# Patient Record
Sex: Male | Born: 1940 | Race: White | Hispanic: No | Marital: Married | Smoking: Former smoker
Health system: Southern US, Community
[De-identification: ages and names within clinical notes are randomized; demographics above are authoritative.]

## PROBLEM LIST (undated history)

## (undated) DIAGNOSIS — E119 Type 2 diabetes mellitus without complications: Secondary | ICD-10-CM

## (undated) DIAGNOSIS — K219 Gastro-esophageal reflux disease without esophagitis: Secondary | ICD-10-CM

## (undated) DIAGNOSIS — R011 Cardiac murmur, unspecified: Secondary | ICD-10-CM

## (undated) DIAGNOSIS — R55 Syncope and collapse: Secondary | ICD-10-CM

## (undated) DIAGNOSIS — Z87442 Personal history of urinary calculi: Secondary | ICD-10-CM

## (undated) DIAGNOSIS — I4891 Unspecified atrial fibrillation: Secondary | ICD-10-CM

## (undated) DIAGNOSIS — N189 Chronic kidney disease, unspecified: Secondary | ICD-10-CM

## (undated) DIAGNOSIS — I2510 Atherosclerotic heart disease of native coronary artery without angina pectoris: Secondary | ICD-10-CM

## (undated) DIAGNOSIS — R0600 Dyspnea, unspecified: Secondary | ICD-10-CM

## (undated) DIAGNOSIS — M1990 Unspecified osteoarthritis, unspecified site: Secondary | ICD-10-CM

## (undated) DIAGNOSIS — I219 Acute myocardial infarction, unspecified: Secondary | ICD-10-CM

## (undated) DIAGNOSIS — E785 Hyperlipidemia, unspecified: Secondary | ICD-10-CM

## (undated) DIAGNOSIS — G4730 Sleep apnea, unspecified: Secondary | ICD-10-CM

## (undated) DIAGNOSIS — K2270 Barrett's esophagus without dysplasia: Secondary | ICD-10-CM

## (undated) DIAGNOSIS — I10 Essential (primary) hypertension: Secondary | ICD-10-CM

## (undated) HISTORY — PX: HERNIA REPAIR: SHX51

## (undated) HISTORY — PX: JOINT REPLACEMENT: SHX530

## (undated) HISTORY — PX: LITHOTRIPSY: SUR834

## (undated) HISTORY — DX: Gastro-esophageal reflux disease without esophagitis: K21.9

## (undated) HISTORY — PX: CORONARY ANGIOPLASTY WITH STENT PLACEMENT: SHX49

## (undated) HISTORY — DX: Acute myocardial infarction, unspecified: I21.9

## (undated) HISTORY — PX: NEPHROLITHOTOMY: SHX5134

## (undated) HISTORY — PX: KIDNEY STONE SURGERY: SHX686

## (undated) HISTORY — PX: SHOULDER SURGERY: SHX246

## (undated) HISTORY — DX: Unspecified osteoarthritis, unspecified site: M19.90

## (undated) HISTORY — PX: UPPER GI ENDOSCOPY: SHX6162

## (undated) HISTORY — PX: CORONARY ARTERY BYPASS GRAFT: SHX141

## (undated) HISTORY — PX: COLONOSCOPY W/ POLYPECTOMY: SHX1380

## (undated) HISTORY — DX: Chronic kidney disease, unspecified: N18.9

## (undated) HISTORY — DX: Cardiac murmur, unspecified: R01.1

---

## 1997-11-24 ENCOUNTER — Ambulatory Visit (HOSPITAL_COMMUNITY): Admission: RE | Admit: 1997-11-24 | Discharge: 1997-11-24 | Payer: Self-pay | Admitting: Cardiology

## 1997-12-03 ENCOUNTER — Ambulatory Visit (HOSPITAL_COMMUNITY): Admission: RE | Admit: 1997-12-03 | Discharge: 1997-12-03 | Payer: Self-pay | Admitting: Cardiology

## 1999-03-06 ENCOUNTER — Emergency Department (HOSPITAL_COMMUNITY): Admission: EM | Admit: 1999-03-06 | Discharge: 1999-03-06 | Payer: Self-pay | Admitting: Emergency Medicine

## 1999-03-06 ENCOUNTER — Encounter: Payer: Self-pay | Admitting: Emergency Medicine

## 1999-04-13 ENCOUNTER — Inpatient Hospital Stay (HOSPITAL_COMMUNITY): Admission: RE | Admit: 1999-04-13 | Discharge: 1999-04-15 | Payer: Self-pay | Admitting: Orthopedic Surgery

## 1999-04-13 ENCOUNTER — Encounter: Payer: Self-pay | Admitting: Orthopedic Surgery

## 1999-06-17 ENCOUNTER — Inpatient Hospital Stay (HOSPITAL_COMMUNITY): Admission: RE | Admit: 1999-06-17 | Discharge: 1999-06-19 | Payer: Self-pay | Admitting: Orthopedic Surgery

## 2002-06-09 ENCOUNTER — Inpatient Hospital Stay (HOSPITAL_COMMUNITY): Admission: RE | Admit: 2002-06-09 | Discharge: 2002-06-11 | Payer: Self-pay | Admitting: Urology

## 2002-06-09 ENCOUNTER — Encounter: Payer: Self-pay | Admitting: Urology

## 2002-06-18 ENCOUNTER — Ambulatory Visit (HOSPITAL_COMMUNITY): Admission: RE | Admit: 2002-06-18 | Discharge: 2002-06-18 | Payer: Self-pay | Admitting: Urology

## 2002-06-18 ENCOUNTER — Encounter: Payer: Self-pay | Admitting: Urology

## 2003-06-01 ENCOUNTER — Inpatient Hospital Stay (HOSPITAL_COMMUNITY): Admission: EM | Admit: 2003-06-01 | Discharge: 2003-06-02 | Payer: Self-pay | Admitting: Emergency Medicine

## 2003-06-08 ENCOUNTER — Ambulatory Visit (HOSPITAL_COMMUNITY): Admission: RE | Admit: 2003-06-08 | Discharge: 2003-06-08 | Payer: Self-pay | Admitting: Cardiology

## 2004-09-23 ENCOUNTER — Observation Stay (HOSPITAL_COMMUNITY): Admission: RE | Admit: 2004-09-23 | Discharge: 2004-09-24 | Payer: Self-pay | Admitting: Cardiology

## 2004-12-23 ENCOUNTER — Ambulatory Visit: Payer: Self-pay | Admitting: Gastroenterology

## 2004-12-27 ENCOUNTER — Ambulatory Visit: Payer: Self-pay | Admitting: Gastroenterology

## 2004-12-27 ENCOUNTER — Encounter (INDEPENDENT_AMBULATORY_CARE_PROVIDER_SITE_OTHER): Payer: Self-pay | Admitting: Specialist

## 2005-01-02 ENCOUNTER — Ambulatory Visit: Payer: Self-pay | Admitting: Gastroenterology

## 2005-01-12 ENCOUNTER — Ambulatory Visit: Payer: Self-pay | Admitting: Gastroenterology

## 2005-12-08 ENCOUNTER — Ambulatory Visit (HOSPITAL_BASED_OUTPATIENT_CLINIC_OR_DEPARTMENT_OTHER): Admission: RE | Admit: 2005-12-08 | Discharge: 2005-12-08 | Payer: Self-pay | Admitting: Urology

## 2005-12-18 ENCOUNTER — Ambulatory Visit (HOSPITAL_BASED_OUTPATIENT_CLINIC_OR_DEPARTMENT_OTHER): Admission: RE | Admit: 2005-12-18 | Discharge: 2005-12-18 | Payer: Self-pay | Admitting: Urology

## 2006-08-02 ENCOUNTER — Inpatient Hospital Stay (HOSPITAL_COMMUNITY): Admission: RE | Admit: 2006-08-02 | Discharge: 2006-08-04 | Payer: Self-pay | Admitting: Urology

## 2007-05-06 ENCOUNTER — Emergency Department (HOSPITAL_COMMUNITY): Admission: EM | Admit: 2007-05-06 | Discharge: 2007-05-07 | Payer: Self-pay | Admitting: Emergency Medicine

## 2007-05-07 ENCOUNTER — Encounter (INDEPENDENT_AMBULATORY_CARE_PROVIDER_SITE_OTHER): Payer: Self-pay | Admitting: *Deleted

## 2007-08-30 ENCOUNTER — Telehealth: Payer: Self-pay | Admitting: Gastroenterology

## 2008-08-19 ENCOUNTER — Telehealth: Payer: Self-pay | Admitting: Gastroenterology

## 2008-08-25 DIAGNOSIS — I119 Hypertensive heart disease without heart failure: Secondary | ICD-10-CM | POA: Insufficient documentation

## 2008-08-25 DIAGNOSIS — G4730 Sleep apnea, unspecified: Secondary | ICD-10-CM | POA: Insufficient documentation

## 2008-08-25 DIAGNOSIS — E119 Type 2 diabetes mellitus without complications: Secondary | ICD-10-CM | POA: Insufficient documentation

## 2008-08-26 ENCOUNTER — Ambulatory Visit: Payer: Self-pay | Admitting: Gastroenterology

## 2008-08-26 DIAGNOSIS — K2270 Barrett's esophagus without dysplasia: Secondary | ICD-10-CM | POA: Insufficient documentation

## 2008-09-10 ENCOUNTER — Ambulatory Visit: Payer: Self-pay | Admitting: Gastroenterology

## 2008-09-10 ENCOUNTER — Encounter: Payer: Self-pay | Admitting: Gastroenterology

## 2008-09-22 ENCOUNTER — Encounter: Payer: Self-pay | Admitting: Gastroenterology

## 2009-02-12 ENCOUNTER — Inpatient Hospital Stay (HOSPITAL_COMMUNITY): Admission: EM | Admit: 2009-02-12 | Discharge: 2009-02-17 | Payer: Self-pay | Admitting: Emergency Medicine

## 2010-03-04 ENCOUNTER — Other Ambulatory Visit: Payer: Self-pay | Admitting: Surgery

## 2010-03-05 ENCOUNTER — Encounter: Payer: Self-pay | Admitting: Gastroenterology

## 2010-05-01 LAB — T4, FREE: Free T4: 1.46 ng/dL (ref 0.80–1.80)

## 2010-05-01 LAB — CBC
HCT: 37.4 % — ABNORMAL LOW (ref 39.0–52.0)
HCT: 38.1 % — ABNORMAL LOW (ref 39.0–52.0)
Hemoglobin: 11.9 g/dL — ABNORMAL LOW (ref 13.0–17.0)
Hemoglobin: 12.9 g/dL — ABNORMAL LOW (ref 13.0–17.0)
Hemoglobin: 13.2 g/dL (ref 13.0–17.0)
MCHC: 34.7 g/dL (ref 30.0–36.0)
MCHC: 34.9 g/dL (ref 30.0–36.0)
MCHC: 35.2 g/dL (ref 30.0–36.0)
MCV: 96.6 fL (ref 78.0–100.0)
MCV: 96.7 fL (ref 78.0–100.0)
Platelets: 196 10*3/uL (ref 150–400)
Platelets: 202 10*3/uL (ref 150–400)
Platelets: 230 10*3/uL (ref 150–400)
RBC: 3.9 MIL/uL — ABNORMAL LOW (ref 4.22–5.81)
RDW: 12.8 % (ref 11.5–15.5)
RDW: 13 % (ref 11.5–15.5)
RDW: 13.4 % (ref 11.5–15.5)
WBC: 10 10*3/uL (ref 4.0–10.5)
WBC: 8 10*3/uL (ref 4.0–10.5)

## 2010-05-01 LAB — BASIC METABOLIC PANEL WITH GFR
BUN: 19 mg/dL (ref 6–23)
CO2: 21 meq/L (ref 19–32)
Calcium: 8.9 mg/dL (ref 8.4–10.5)
Chloride: 106 meq/L (ref 96–112)
Creatinine, Ser: 0.87 mg/dL (ref 0.4–1.5)
GFR calc non Af Amer: >60 mL/min
Glucose, Bld: 194 mg/dL — ABNORMAL HIGH (ref 70–99)
Potassium: 4.3 meq/L (ref 3.5–5.1)
Sodium: 134 meq/L — ABNORMAL LOW (ref 135–145)

## 2010-05-01 LAB — CARDIAC PANEL(CRET KIN+CKTOT+MB+TROPI)
CK, MB: 1.2 ng/mL (ref 0.3–4.0)
Relative Index: INVALID (ref 0.0–2.5)
Total CK: 41 U/L (ref 7–232)
Total CK: 59 U/L (ref 7–232)
Troponin I: 0.12 ng/mL — ABNORMAL HIGH (ref 0.00–0.06)
Troponin I: 0.22 ng/mL — ABNORMAL HIGH (ref 0.00–0.06)

## 2010-05-01 LAB — BASIC METABOLIC PANEL
BUN: 13 mg/dL (ref 6–23)
BUN: 19 mg/dL (ref 6–23)
CO2: 21 mEq/L (ref 19–32)
CO2: 23 mEq/L (ref 19–32)
CO2: 26 mEq/L (ref 19–32)
Calcium: 8.9 mg/dL (ref 8.4–10.5)
Calcium: 9.1 mg/dL (ref 8.4–10.5)
Chloride: 103 mEq/L (ref 96–112)
Chloride: 107 mEq/L (ref 96–112)
Creatinine, Ser: 0.88 mg/dL (ref 0.4–1.5)
Creatinine, Ser: 1.14 mg/dL (ref 0.4–1.5)
GFR calc Af Amer: >60 mL/min (ref >60)
Glucose, Bld: 167 mg/dL — ABNORMAL HIGH (ref 70–99)
Glucose, Bld: 182 mg/dL — ABNORMAL HIGH (ref 70–99)
Glucose, Bld: 207 mg/dL — ABNORMAL HIGH (ref 70–99)
Glucose, Bld: 279 mg/dL — ABNORMAL HIGH (ref 70–99)
Potassium: 3.9 mEq/L (ref 3.5–5.1)
Potassium: 3.9 mEq/L (ref 3.5–5.1)
Sodium: 136 mEq/L (ref 135–145)
Sodium: 137 mEq/L (ref 135–145)

## 2010-05-01 LAB — LIPID PANEL
HDL: 35 mg/dL — ABNORMAL LOW (ref >39)
Total CHOL/HDL Ratio: 3.8 RATIO
Triglycerides: 152 mg/dL — ABNORMAL HIGH (ref <150)
VLDL: 30 mg/dL (ref 0–40)

## 2010-05-01 LAB — GLUCOSE, CAPILLARY
Glucose-Capillary: 157 mg/dL — ABNORMAL HIGH (ref 70–99)
Glucose-Capillary: 198 mg/dL — ABNORMAL HIGH (ref 70–99)
Glucose-Capillary: 199 mg/dL — ABNORMAL HIGH (ref 70–99)
Glucose-Capillary: 244 mg/dL — ABNORMAL HIGH (ref 70–99)
Glucose-Capillary: 250 mg/dL — ABNORMAL HIGH (ref 70–99)
Glucose-Capillary: 265 mg/dL — ABNORMAL HIGH (ref 70–99)

## 2010-05-01 LAB — PROTIME-INR: INR: 1.06 (ref 0.00–1.49)

## 2010-05-01 LAB — HEPARIN LEVEL (UNFRACTIONATED): Heparin Unfractionated: 0.46 IU/mL (ref 0.30–0.70)

## 2010-05-01 LAB — APTT: aPTT: 29 seconds (ref 24–37)

## 2010-05-01 LAB — T3: T3, Total: 113.7 ng/dl (ref 80.0–204.0)

## 2010-05-01 LAB — TSH: TSH: 0.279 u[IU]/mL — ABNORMAL LOW (ref 0.350–4.500)

## 2010-05-16 LAB — URINALYSIS, ROUTINE W REFLEX MICROSCOPIC
Bilirubin Urine: NEGATIVE
Hgb urine dipstick: NEGATIVE
Ketones, ur: NEGATIVE mg/dL
Protein, ur: NEGATIVE mg/dL
Urobilinogen, UA: 0.2 mg/dL (ref 0.0–1.0)

## 2010-05-16 LAB — CBC
HCT: 41.4 % (ref 39.0–52.0)
MCHC: 35.1 g/dL (ref 30.0–36.0)
MCV: 96.3 fL (ref 78.0–100.0)
RBC: 4.3 MIL/uL (ref 4.22–5.81)

## 2010-05-16 LAB — DIFFERENTIAL
Basophils Absolute: 0 10*3/uL (ref 0.0–0.1)
Lymphocytes Relative: 33 % (ref 12–46)
Lymphs Abs: 2.6 10*3/uL (ref 0.7–4.0)
Neutro Abs: 4.2 10*3/uL (ref 1.7–7.7)
Neutrophils Relative %: 54 % (ref 43–77)

## 2010-05-16 LAB — COMPREHENSIVE METABOLIC PANEL
BUN: 20 mg/dL (ref 6–23)
CO2: 24 mEq/L (ref 19–32)
Calcium: 9.7 mg/dL (ref 8.4–10.5)
Chloride: 102 mEq/L (ref 96–112)
Creatinine, Ser: 0.86 mg/dL (ref 0.4–1.5)
GFR calc Af Amer: >60 mL/min (ref >60)
GFR calc non Af Amer: >60 mL/min (ref >60)
Glucose, Bld: 173 mg/dL — ABNORMAL HIGH (ref 70–99)
Total Bilirubin: 0.7 mg/dL (ref 0.3–1.2)

## 2010-05-16 LAB — POCT CARDIAC MARKERS
Myoglobin, poc: 72 ng/mL (ref 12–200)
Troponin i, poc: 0.11 ng/mL — ABNORMAL HIGH (ref 0.00–0.09)

## 2010-05-16 LAB — HEMOGLOBIN A1C
Hgb A1c MFr Bld: 9.1 % — ABNORMAL HIGH (ref 4.6–6.1)
Mean Plasma Glucose: 214 mg/dL

## 2010-05-16 LAB — PROTIME-INR
INR: 0.97 (ref 0.00–1.49)
Prothrombin Time: 12.8 seconds (ref 11.6–15.2)

## 2010-05-16 LAB — TSH: TSH: 0.294 u[IU]/mL — ABNORMAL LOW (ref 0.350–4.500)

## 2010-05-16 LAB — TROPONIN I: Troponin I: 0.24 ng/mL — ABNORMAL HIGH (ref 0.00–0.06)

## 2010-05-16 LAB — MAGNESIUM: Magnesium: 1.8 mg/dL (ref 1.5–2.5)

## 2010-05-22 LAB — GLUCOSE, CAPILLARY: Glucose-Capillary: 170 mg/dL — ABNORMAL HIGH (ref 70–99)

## 2010-06-28 NOTE — Consult Note (Signed)
[name], [name]              ACCOUNT NO.:  [id number]   MEDICAL RECORD NO.:  [id number]          PATIENT TYPE:  INP   LOCATION:  1410                         FACILITY:  [facility]   PHYSICIAN:  Tera Mater. [place], M.D. DATE OF BIRTH:  July 18, 1940   DATE OF CONSULTATION:  08/04/2006  DATE OF DISCHARGE:                                 CONSULTATION   HISTORY OF PRESENT ILLNESS:  [name] is a 70 year old white male with  a history of long standing hypertension, type 2 diabetes who is treated  with oral agents, coronary artery disease, sleep apnea, hyperlipidemia,  and Barrett's esophagus. He was last seen in our office on April 12, 2006. He is now admitted with a renal calculus, requiring an open  extraction. He is now postoperative day 2. I was asked to see him for  elevated blood pressure's.   The patient is doing well. He is sitting up, he is eating, he is having  no pain at the moment. His blood pressure's have been intermittently  elevated. The highest one I can see measures 190/90. He was in the 150's  for much of the day, yesterday, and then has up a bit further to 177/78  this morning. He specifically has no breathing trouble, no chest pain,  no headache, no vision change, or any other complaints. His blood sugar  is running well and it was about 118 this morning. He does not note any  other complaints.   LABORATORY DATA:  From the 20th, shows a BUN of 21, creatinine 1.26,  glucose 158. Electrolytes were fine. Creatinine later in the day was  1.4.   IMPRESSION:  In summary, we have a 70 year old with multiple medical  problems including known hypertension. At the present time, he is  suboptimally controlled on triple oral therapy with Avapro, Coreg, and  Norvasc. All of these issues can be managed as an outpatient, however.  He is not in any immediate harm. I am going to give him a prescription  for Avalide 300/12.5, to replace his Avapro. He will continue on his  Coreg  25 twice daily, his Norvasc once daily, and his Vytorin. He is  also on Plavix for his cardiac issues and Actos and a baby aspirin, all  of which he will continue. I do not see any reason to delay his  discharge due to this. I will notify Dr. Wylene Simmer of these issues.           ______________________________  Tera Mater Evlyn Kanner, M.D.     SAS/MEDQ  D:  08/04/2006  T:  08/04/2006  Job:  [id number]

## 2010-06-28 NOTE — Op Note (Signed)
NAMEKELLEN, [name]              ACCOUNT NO.:  [id number]   MEDICAL RECORD NO.:  [id number]          PATIENT TYPE:  OIB   LOCATION:  1410                         FACILITY:  [facility]   PHYSICIAN:  Terie Purser, MD         DATE OF BIRTH:  1940-09-22   DATE OF PROCEDURE:  08/01/2006  DATE OF DISCHARGE:                               OPERATIVE REPORT   PREOPERATIVE DIAGNOSIS:  Left ureteral stones.   POSTOPERATIVE DIAGNOSIS:  Left ureteral stones.   PROCEDURES:  1. Left ureterolithotomy.  2. Flexible left ureteroscopy.   SURGEON:  Bertram Dyshon. Retta Diones, M.D.   RESIDENT:  Terie Purser, MD.   ANESTHESIA:  General.   COMPLICATIONS:  None.   SPECIMENS:  Stones to patient.   ESTIMATED BLOOD LOSS:  150 mL.   DRAINS:  6-French 26 cm double-J ureteral stent on the left.   DISPOSITION:  Stable to post anesthesia care unit.   INDICATIONS FOR PROCEDURE:  Mr. Buchanon is a 70 year old male who has a  history of bilateral kidney stones.  He has had large left-sided  ureteral stones.  He has undergone shock wave lithotripsy as well as  ureteroscopic stone manipulation.  He still has continued large burden  of stone and was counseled regarding continued endoscopic management  versus open procedure.  He has decided to proceed with open right  ureterolithotomy. after full discussion of benefits and risks, his  consent was obtained.   DESCRIPTION OF PROCEDURE:  The patient brought to the operating room  after properly identified.  Time-out was performed to confirm the  correct patient, procedure and side.  He was administered general  anesthesia, given preoperative antibiotics and placed in the supine  position  on the operating table and a bump was placed under his left  side.  He was then prepped and draped in the usual sterile manner.  We  began by making an approximate 12 cm incision in the left lower  quadrant.  Dissection was carried down through the subcutaneous tissues  and muscle layers  with the Bovie cautery.  We entered the  retroperitoneal space and swept the peritoneum medially.  We identified  the psoas muscle.  A Bookwalter retractor was placed to facilitate  exposure.  The ureter was identified coursing through the  retroperitoneum as it crossed over the iliac vessels.  The ureter was  quite dilated and had a significant inflammatory response surrounding  the ureter.  We were able to palpate the stones in the ureter.  We  carefully dissected the ureter free of its dense adhesions proximal to  the stones and placed a Vesseloop for control.  We then, after careful  and minimal mobilization of the ureter, incised the ureter in a  longitudinal fashion using a #12 blade over the stones.  Total length of  the incision was approximately 3 cm.  We encountered first the more  proximal stone which was removed in its entirety.  The second and larger  distal stone was encountered just distal to the first stone.  The stone  was also removed in its entirety.  At this point we proceeded with flexible ureteroscopy to further  evaluate the ureter.  We first looked distally.  There were no  significant stone fragments noted between the incision and the bladder.  We then looked in the proximal portion of the ureter up into the kidney.  There were no other stones noted in the proximal ureter.  The proximal  ureter mucosa appeared normal.  Examination in the kidney did reveal a  small approximately to 3 mm stone in the lower pole of the kidney.  This  was in a lower pole calix and we were unable to retrieve this stone with  a basket and the flexible ureteroscope.  We elected not to further  pursue this stone given the lower pole nature of the stone and given its  minimal size.   We then placed a sensor wire through the incision distally into the  bladder.  A 6-French 26 cm double-J ureteral stent was placed over this  down into the bladder.  We then proceeded to place the wire  proximally  and the stent was guided over the wire into the proximal portion of the  ureter and kidney.  The wire was removed and the stent was noted to be  in good position.  At this point we proceeded to close the ureterotomy.  This was closed in a running fashion with a #4 PDS.  A JP drain was  placed through a separate stab incision into the retroperitoneum.  We  then proceeded to close.  Fascial layers were closed in two layers using  #1 PDS.  The subcutaneous tissues were then irrigated and the skin was  closed with staples.  A sterile dressing was applied.  The patient was  awakened, transported to the recovery room in stable condition.  There  was no complications.  All sponge and needle counts were correct at the  end of the case.  Please note Dr. Retta Diones was present and participated  in all aspects of this procedure as he is primary Careers adviser.           ______________________________  Terie Purser, MD     JH/MEDQ  D:  08/01/2006  T:  08/02/2006  Job:  [id number]

## 2010-06-28 NOTE — Consult Note (Signed)
[name], [name]              ACCOUNT NO.:  [id number]   MEDICAL RECORD NO.:  [id number]          PATIENT TYPE:  EMS   LOCATION:  MAJO                         FACILITY:  [facility]   PHYSICIAN:  Larina Earthly, M.D.        DATE OF BIRTH:  [birth year]   DATE OF CONSULTATION:  05/07/2007  DATE OF DISCHARGE:  05/07/2007                                 CONSULTATION   REQUESTING PHYSICIAN:  Orlene Och, MD.   REASON FOR CONSULTATION:  Request for admission based on presenting  symptoms with refusal after discussion between myself, the emergency  room physician, the patient and family members.   HISTORY OF PRESENT ILLNESS:  This is a 70 year old Caucasian gentleman  who has multiple medical comorbidities including  1. Hypertension with a history of hypertensive urgency.  2. Type 2 diabetes mellitus controlled with oral agents.  3. Coronary artery disease status post myocardial infarction and      coronary artery bypass grafting current in 1987.  4. Gastroesophageal reflux disease.  5. Obstructive sleep apnea complicated by noncompliance with CPAP      machine.  6. History of bilateral nephrolithiasis status post numerous      corrective procedures.  7. History of a left inguinal hernia repair and shoulder surgery.   This individual currently leads a fairly active lifestyle working part-  time as a Network engineer and has been followed by  both Dr. Wylene Simmer and Dr. Jacinto Halim on a regular basis.  Indeed, the patient  states that he underwent a stress test by Dr. Jacinto Halim approximately two  weeks ago which was reportedly normal and he also saw Dr. Wylene Simmer within  the last one week and had a hemoglobin A1c that was reportedly 7.   Earlier this evening on May 06, 2007, the patient was sitting in a  chair 30 minutes prior to eating dinner when he felt weird and out of  sorts.  He described this in a very vague manner and this was  associated with some abdominal discomfort and  disorientation.  The  patient may have also had some ill-described visual symptomatology.  He  denies any chest pain, shortness of breath, nausea, vomiting, focal  neurological deficits or musculoskeletal issues.  He denies any fevers  or chills.  He stated that he actually felt as if he was absent from his  body for approximately 1-2 weeks.  He did get up and eat dinner  afterwards with no nausea, vomiting or difficulty swallowing.  Soon  after dinner he discovered that he had been incontinent of his urine.  It is unclear whether this took place during the previously mentioned  episode, during dinner or soon thereafter.  Given his symptomatology,  EMS was called.  Upon their arrival, the patient was alert and oriented  x3, described the above-mentioned event and was noted that his systolic  blood pressure was approximately 226.  He was subsequently transported  to the emergency room for further evaluation and management, especially  in light of his multiple comorbidities.   In the emergency room, he was completely lucid, alert  and oriented x3  and underwent laboratory, radiological and EKG testing.  The laboratory  evaluation was unremarkable for any abnormal electrolytes, abnormal CBC.  Initial set of cardiac enzymes was unremarkable.  Head CT was  unremarkable.  EKG did reveal an incomplete right bundle branch block  and significant ST-T-wave inversions in the inferolateral leads which  was different and new compared with an EKG from August 2006, at which  time he had a cardiac stent placed after a positive stress test.  He  denied having any more recent EKG in the hospital records.  Given his  worrisome presentation and the unclear etiology of the patient's  proposed syncopal event with incontinence, I was called to admit the  patient by the emergency room physician.   Upon my arrival, the patient was completely alert and oriented x3 and  was adamant that he not be admitted given  the fact that no additional  workup other than observation was going to be performed tonight.  We  talked about the multiple possibilities for his episode which could  include an arrhythmia, seizure-like activity, TIA or simple falling  asleep given his significant obstructive sleep apnea and noncompliance  with his CPAP machine.  We further discussed the fact that observation  may be prudent to rule out hemodynamic instability if an arrhythmia were  to reoccur or if he would have any further possible seizure activity or  neurological events.  Again the patient refused admission.  This was  discussed with Dr. Patrica Duel, who agree to make a disposition on the  patient.   REVIEW OF SYSTEMS:  Again negative for fevers, chills, nausea, vomiting,  focal neurological deficits other than feeling disoriented and losing  track of time for a 10-15 minute period of time which was observed by  the patient's wife and partially by the patient's son with no tremors or  shaking type episodes.  He was found to be incontinent.  He denies any  trauma or falls.  He does describe an abnormal visual sensation that  occurred approximately one week ago that lasted approximately one hour  but again denied any blindness, visual field defects or diplopia.  He  denies any shortness of breath, cough, wheezing, bronchospasm, chills,  blood per rectum or urine or new musculoskeletal deficits.  He denies  any palpitations or difficulty swallowing.  Family does report that he  does fall asleep quite a bit during the day and consistent with his  obstructive sleep apnea.   PROBLEM LIST:  As described above.   MEDICATIONS:  Currently  1. Actos 15 mg.  2. Aspirin 81 mg.  3. Plavix 75 mg.  4. Vytorin 10/40 one p.o. daily.  5. Coreg 12.5 mg b.i.d.  6. Avalide 300/12.5 one p.o. daily.   ALLERGIES:  No known drug allergies.   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   LABORATORY DATA:  Sodium 136, potassium 4, BUN 29, creatinine 1.3,  glucose 148.  White blood cell count 10, hemoglobin 14.2, hematocrit  41.2%, platelet count 267, myoglobin 85.7.  CK-MB less than 1, troponin-  I less than 0.05.   EKG as above.   Head CT unremarkable for any acute changes.   PHYSICAL EXAMINATION:  GENERAL APPEARANCE:  We have an obese Caucasian  male sitting up in bed, alert and oriented x3, answering all questions  appropriately.  VITAL SIGNS:  Temperature  97.9 degrees Fahrenheit, blood pressure  155/70, respirations 20 nonlabored, oxygen saturation 96% on room air,  pulse 74 and regular.  HEENT:  Face symmetric.  Tongue midline.  There is no oropharyngeal  lesions.  NECK:  Supple.  There is no cervical lymphadenopathy.  No carotid bruits  appreciated.  No thyromegaly.  No axillary lymphadenopathy.  LUNGS:  Clear to auscultation bilaterally with some mild rhonchi  occasionally, that clear with cough.  CARDIOVASCULAR:  Reveals regular rate and rhythm with no S3 or S4  appreciated.  ABDOMEN:  Soft, nontender, nondistended abdomen.  Bowel sounds present.  There is no hepatosplenomegaly.  EXTREMITIES:  No edema.  Pedal pulses are intact and regular.  There is  no resting tremors.  There is no pronator drift.  Muscle strength is 5/5  in all for extremities.  Light touch is intact in all four extremities.   ASSESSMENT/PLAN:  1. Syncope with incontinence of unclear etiology with numerous      possibilities, with the patient refusing further inpatient workup      and evaluation.  Electrolytes were unremarkable.  EKG is certainly      concerning for changes compared to three 3 years ago but certainly      we will need recent EKGs for comparison.  Stress test per family      and patient report was unremarkable two  weeks ago.  Again possible      etiologies were described above and discussed with the patient, yet       he refuses further evaluation on an inpatient basis.  He was to      return to the emergency room if he has recurrent symptomatology or      to call Dr. Jacinto Halim or Dr. Deneen Harts office pending further      recurrence and the characteristics of these symptoms.  2. Coronary artery disease.  Currently hemodynamically stable with one      set of cardiac enzymes negative but an abnormal EKG as described      above.  Will need further workup in comparison with previous EKGs.  3. Obstructive sleep apnea.  Again, this was discussed at length with      the patient and family with regards to compliance with his CPAP      machine and the possible cardiac and neurological ramifications      that uncontrolled sleep apnea may have.  4. Hypertension.  Currently labile, but reasonable on current doses of      ARB, beta blockade and diuretics.      Larina Earthly, M.D.  Electronically Signed     RA/MEDQ  D:  05/07/2007  T:  05/07/2007  Job:  [id number]   cc:   Gaspar Garbe, M.D.  Cristy Hilts. Jacinto Halim, MD

## 2010-06-28 NOTE — H&P (Signed)
[name], [name]              ACCOUNT NO.:  [id number]   MEDICAL RECORD NO.:  [id number]          PATIENT TYPE:  OIB   LOCATION:  1410                         FACILITY:  [facility]   PHYSICIAN:  [name] Mayer. [name], M.D.DATE OF BIRTH:  09-10-1940   DATE OF ADMISSION:  08/01/2006  DATE OF DISCHARGE:                              HISTORY & PHYSICAL   CHIEF COMPLAINT:  Ureteral stone.   HISTORY OF PRESENT ILLNESS:  [name] is a 70 year old gentleman with  a history of bilateral kidney stones.  He has had left-sided flank pain  over the past several months.  Evaluation has revealed multiple left  ureteral stones.  He has undergone multiple treatments for this  including left ureteroscopy as well as shock-wave lithotripsy.  He was  recently reevaluated with CT scan and was found to have a large mid-  distal left ureteral stone which was approximately 1 cm in size.  He  also had another approximate 1-cm stone just proximal to this in the  left side.  He is admitted this hospitalization for surgical management  including left open ureterolithotomy.   The patient states he has been in his usual state of health.  He has not  had any recent fevers or chills.  There is no urinary tract infection.  He does complain of occasional flank pain.  There is no gross hematuria.   ALLERGIES:  None.   MEDICATIONS:  1. Actos.  2. Aspirin.  3. Avapro.  4. Coreg.  5. Vytorin.  6. Plavix.   PAST MEDICAL HISTORY:  1. Kidney stones.  2. Coronary artery disease, status post MI.  3. Hypertension.  4. Diabetes.   PAST SURGICAL HISTORY:  1. Ureteroscopic stone manipulation.  2. Coronary artery bypass.  3. Open right kidney stone surgery.  4. Left inguinal hernia repair.   FAMILY MEDICAL HISTORY:  Negative for any kidney stones or GU  malignancy.   SOCIAL HISTORY:  The patient is a married and retired.  He denies any  alcohol, tobacco or drug use.   REVIEW OF SYSTEMS:  Was performed.  He  does complain of occasional  fatigue and back pain; otherwise, multisystem review was performed and  as above in history of present illness is negative.   EXAMINATIONS:  GENERAL:  A well-nourished white male in no acute  distress.  HEENT:  Normocephalic, atraumatic.  NECK:  Supple without lymphadenopathy.  CARDIOVASCULAR:  Regular rate and rhythm.  PULMONARY:  Clear to auscultation bilaterally.  ABDOMEN:  Soft, nontender, nondistended.  There are well-healed scars in  the left lower quadrant as well as the right flank.  GU:  Exam reveals circumcised phallus.  Testicles are both descended and  free of any palpable abnormality.  EXTREMITIES:  There is no cyanosis, clubbing or edema.   IMAGING STUDIES:  CT of the abdomen and pelvis performed on June 3  reveals 2 large left ureteral stones as well as a small stone in the  lower pole of the left kidney.  There is associated hydronephrosis and  hydroureter.   ASSESSMENT AND PLAN:  [age]-year-old male with left-sided ureteral  stones which are refractory to treatment.  The patient will be admitted  after undergoing open ureterolithotomy for routine postoperative care.     ______________________________  Terie Purser, MD      [name], M.D.  Electronically Signed    JH/MEDQ  D:  08/01/2006  T:  08/02/2006  Job:  [id number]

## 2010-07-01 NOTE — Op Note (Signed)
[place]  Patient:    [name], [name]                     MRN: [id number] Proc. Date: 04/13/99 Adm. Date:  [id number] Attending:  Sypher, Douglass Rivers CC:         Janae Bridgeman. Eloise Harman., M.D.             Katy Fitch Naaman Plummer., M.D.             Anesthesia                           Operative Report  PREOPERATIVE DIAGNOSIS:  Severe degenerative arthritis, right shoulder, with full-thickness hyaline particulate cartilage loss and large marginal osteophyte  development, humeral head, with full-thickness cartilage loss of glenoid.  POSTOPERATIVE DIAGNOSIS:  Severe degenerative arthritis, right shoulder, with full-thickness hyaline particulate cartilage loss and large marginal osteophyte  development, humeral head, with full-thickness cartilage loss of glenoid.  OPERATION: Reconstruction, left shoulder, with bimodular implant arthroplasty utilizing a 12 x 115 mm humeral stem, a 48 x 19 mm head and neck implant, and a  medium-size, 4 mm polyethylene glenoid.  SURGEON:  Katy Fitch. Sypher, Montez Hageman., M.D.  ASSISTANT:  Jaquelyn Bitter. Chabon, P.A.  ANESTHESIA:  General orotracheal.  SUPERVISING ANESTHESIOLOGIST:  Guadalupe Maple, M.D.  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  He has developed severe bilateral shoulder arthritis.  He has been evaluated by a number of orthopedic surgeons including Dr. Eulah Pont, Dr. Cleophas Dunker, and myself.  Each of [place] has evaluated his arthritis predicament and recommended implant arthroplasty.  After informed consent, he is brought to the operating room at this time anticipating a left total shoulder arthroplasty.  Preoperatively, he was advised that he may be rotator cuff deficient and would ave difficulty recovery full range of motion postoperatively.  The primary indication for surgery is pain relief.  Preoperatively, he was advised of  potential complications of loosening of his glenoid and/or humeral implant components, possible early or late infection, and possible neurovascular injury with surgery.  He understands there could be problems with cement allergy and other predicaments such as wear of the implant components.  Despite these potential risks and due to the severe nature of his painful arthritis predicament, he proceeds with surgery at this time.  DESCRIPTION OF PROCEDURE:   [name] was brought to the operating room and placed in supine position on the operating room table.  Following induction of general orotracheal anesthesia, a Foley catheter was placed to monitor urine output.  Ancef 1 g was administered as IV prophylactic antibiotic.  The entire left upper extremity and shoulder were prepped with DuraPrep and draped with impervious arthroscopy drapes.  The procedure commenced with a 15 cm incision extending from the clavicle to the insertion of the pectoralis major in the deltopectoral interval.  Subcutaneous tissue was carefully divided, taking care to identify the cephalic vein.  This as retracted medially after suture ligation of three branches of the deltoid.  The  clavipectoral fascia was identified, split with scissors, and the subscapularis  muscle identified and tagged.  The short head of the biceps was taken down with the electrocautery and tagged for later repair at the coracoid.  The subscapularis as elevated with the electrocautery, partial thickness to allow a capsular and tendon lengthening anteriorly to  help correct his internal rotation contracture.  With great care, the coracohumeral ligament was released, and the inferior capsule was dissected after identifying the axillary nerve by palpation and visualization.  A joker retractor was placed inferiorly to protect the axillary nerve, and a Crego retractor placed superiorly while the humeral head was excellently  rotated and essentially partially dislocated.  The inferior osteophytes were removed, identifying the hilar articular cartilage surface to be resected.  There was full-thickness cartilage loss in the humeral head.  With the aid of the proper template, the humeral head articular surface was resected and measured to be 18 mm in height and approximately 50 mm in diameter. Our initial thought was an implant measuring 52 mm in width would be appropriate.  The glenoid was then exposed with the Fucuda retractor placed behind the glenoid followed by decortication and removal of remained hilar articular cartilage on he margins of the glenoid.  Degenerative portions of the labrum was excised to fully visualized the glenoid.  A ______ bur was used to decorticate the glenoid and create a deep groove for the fin of the glenoid implant.  Three drill holes were created for cement fixation in the margins of the glenoid.  A trial medium-size glenoid component was placed and was noted to fit properly inside the labrum.  It appeared that we would have good cement containment.  The wound was thoroughly lavaged with sterile saline.  A 4 mm medium-size glenoid component was then cemented into position with standard cement technique, taking care to irrigate the joint with triple antibiotic solution prior to placement of the cement and careful drying of the bone prior to placement of the cement.  The implant was held in position for approximately 10 minutes while the cement hardened, and all excess cement was removed with curets and Therapist, nutritional.  The humeral head and stem were then prepared with reamers to a canal diameter of 12 mm.  A cutting rasp was then used to prepare the proximal humerus.  The trial humeral implant was placed and impacted into position.  We attempted to use a 52 mm head; however, due to neck length and capsular tightness, this simply was not technically possible.  We  exchanged a 48 x 19 mm  head and neck component and were able to easily reduce the shoulder joint. This  was properly sized with the humeral head implant approximately 2 mm above the greater tuberosity.  The long head of the biceps was intact as was the supraspinatus tendon.  The coracoacromial ligament was preserved, and a significant anterior acromioplasty was not noted to be necessary.  The canal was thoroughly lavaged with sterile saline followed by press fit of the 12 mm stem.  The proper head implant was then impacted onto the stem with sharp  blows from a mallet.  The shoulder was reduced followed by thorough irrigation f the joint with triple antibiotic solution.  The capsule was then repaired with multiple sutures of 2-0 Ethibond placed through bone of lesser tuberosity, taking care to lengthen the subscapularis approximately 8 mm by repairing initially to the capsule and then oversewing with sutures to bone.  This led to external rotation of the shoulder of 40 degrees with the shoulder in neutral position and easy elevation of the shoulder beyond 120 degrees.  The inferior capsule remained somewhat tight.  Therefore, this was stripped with a periosteal elevator directly off the neck of the humerus, taking great care to oth visualize and palpate the  axillary nerve during this maneuver.  The wound was then thoroughly lavaged with sterile saline followed by triple antibiotic solution, and the interval between the supraspinatus and the subscapularis were repaired with mattress suture of 2-0 Ethibond.  The wound was drained with a medium Hemovac brought out through the lateral arm skin and brought through the deltopectoral interval into the inferior aspect of the glenohumeral  joint.  This was placed to a suction container during closure.  The wound was closed with subdermal sutures of 0 Vicryl, subcutaneous sutures of 2-0 Vicryl, nd intradermal 3-0 Prolene with  Steri-Strips.  There were no apparent complications.  [name] was placed in a sling and transferred to the recovery room room in stable condition. DD:  04/13/99 TD:  04/13/99 Job: [id number]

## 2010-07-01 NOTE — Op Note (Signed)
NAMECARROLL, LINGELBACH              ACCOUNT NO.:  [id number]   MEDICAL RECORD NO.:  [id number]          PATIENT TYPE:  AMB   LOCATION:  [facility]                         FACILITY[facility]  [facility]   PHYSICIAN:  Maretta Bees. Vonita Moss, M.D.DATE OF BIRTH:  August 19, 1940   DATE OF PROCEDURE:  12/08/2005  DATE OF DISCHARGE:                                 OPERATIVE REPORT   PREOPERATIVE DIAGNOSIS:  Large left distal ureteral calculi and left  hydronephrosis.   POSTOPERATIVE DIAGNOSIS:  Large left distal ureteral calculi and left  hydronephrosis.   PROCEDURE:  1. Cystoscopy.  2. Left ureteroscopy.  3. Holmium laser stone fragmentation and ureteroscopic stone basketing.  4. Left retrograde pyelogram with interpretation.  5. insertion of left double-J catheter.   SURGEON:  Maretta Bees. Vonita Moss, M.D.   ANESTHESIA:  General.   INDICATIONS:  This gentleman has had a past history of stone disease and has  undergone previous percutaneous nephrolithotomy.  He was found to have  significant left hydronephrosis down to 2 large distal left ureteral calculi  measuring 8 x 6 and 7 x 4 mm in size.  They are not well seen on x-ray and  it is felt that ureteroscopic manipulation is the best method.  He has been  on Plavix for a week and received medical care from Dr. Wylene Simmer.   DESCRIPTION OF PROCEDURE:  The patient was brought to the operating room and  placed in the lithotomy position.  The external genitalia were prepped and  draped in the usual fashion.  He was cystoscoped and he had a high-riding  bladder neck.  Under fluoroscopy I could see his stones although they were  faint.  I inserted a sensor guidewire that left the ureter beyond the stones  without difficulty.  Over the guidewire I dilated the intramural ureter with  the inner sheath of a ureteral access sheath.  I then inserted the sharp  rigid ureteroscope and visualized the most distal stone which is a golden  brown yellow color.  I used the Holmium laser  to fragment the stone in  several small pieces and I used the Nitinol stone basket to completely  remove this distal ureteral calculus. The more proximal calculus was just a  little higher up around a bend in the ureter.  I then reutilized the Holmium  laser on this stone, and although the angle was difficult I was able to  fragment a good deal, if not most of the stone.  I injected contrast to make  sure that there was no extravasation.  Because of the difficult angle of the  stone, length of time that it took to fragment and remove these pieces, I  felt it best, to complete the procedure at this point, and come back for a  second stage ureteroscopic procedure. I removed the ureteroscope and  basketed some further stone fragments.   Then, through the cystoscope I inserted an open-ended ureteral catheter over  the guidewire and then injected contrast to perform a left retrograde  pyelogram.  He had a dilated pyelocalyceal system and, again, previous  injection of contrast revealed  obstructed ureter but no extravasation.   With the guidewire back in place.  I inserted a 6 [place] 26-cm, contour,  double-J catheter with a good full coil in the kidney, and a good full coil  in the bladder.  At this point, I used the cystoscope to rinse out some  residual stone fragments from the bladder.  It also should be noted that the  double-J catheter did not have a string attached.  He was then taken to the  recovery room in good condition having tolerated the procedure well.      Maretta Bees. Vonita Moss, M.D.  Electronically Signed     LJP/MEDQ  D:  12/08/2005  T:  12/10/2005  Job:  [id number]   cc:   Gaspar Garbe, M.D.  Fax: [phone]

## 2010-07-01 NOTE — Cardiovascular Report (Signed)
[name], [name]              ACCOUNT NO.:  [id number]   MEDICAL RECORD NO.:  [id number]          PATIENT TYPE:  AMB   LOCATION:  CATH                         FACILITY:  [facility]   PHYSICIAN:  Vonna Kotyk R. Jacinto Halim, MD       DATE OF BIRTH:  1940-11-08   DATE OF PROCEDURE:  09/23/2004  DATE OF DISCHARGE:                              CARDIAC CATHETERIZATION   PROCEDURE PERFORMED:  1.  Cutting balloon percutaneous transluminal coronary angiography of the      native left anterior descending through the left internal mammary      artery.  2.  Percutaneous transluminal coronary angiography and stenting of the      saphenous vein graft to right coronary artery.   INDICATIONS FOR PROCEDURE:  [name] is a 70 year old gentleman with a  history of hypertension, hyperlipidemia, and obesity, who underwent a  Cardiolite stress test which had revealed anterior wall ischemia and  inferolateral wall ischemia.  Given this, he underwent cardiac  catheterization at [facility] on September 14, 2004, and this had  revealed a 90-95% stenosis of the mid LAD distal to the LIMA insertion and  also the SVG to RCA had a 70-80% stenosis in the mid segment.  He also had  severe, diffuse disease of his native vessels.   Briefly, his native RCA was occluded and distal RCA was supplied by the  saphenous vein graft.  The SVG to the RCA in the mid section had 80%  stenosis and at the graft insertion site, had about 40% stenosis at most.  There was a large PDA and a large PLA which also gave a small PDA branch.  This had a trifurcation complex 95% stenosis.  The native vessels distally  at this site were diffusely diseased.  The left main was normal with  circumflex showing severe, diffuse disease with a mid 80-90% stenosis.  The  LAD gave origin to a large diagonal which was patent and the distal LAD  supplied by the LIMA which had a focal 95% stenosis.  The distal LAD had  diffuse disease.  The SVG to  obtuse marginal was occluded.  Given this, an  angioplasty to the LAD and to the SVG to RCA was deemed to be appropriate.   INTERVENTIONAL DATA:  1.  Successful cutting balloon angioplasty of the mid to distal LAD with a 2      by 6 mm cutting balloon performed at 5 and total of 8 atmospheres      pressure.  The stenosis was reduced from 95% to 0% with excellent TIMI      III flow with no evidence of dissection or thrombus.  2.  Successful PTCA and direct stenting of the SVG to RCA with a 4.5 by 24      mm Liberty stent deployed at 12 atmospheres pressure for 70 seconds.      The stenosis was reduced from 80% to 0%.  TIMI III flow was maintained.      This procedure was performed with the use of a filter wire for distal  protection.   RECOMMENDATIONS:  The patient will be continued on medical therapy for  diffuse disease in his distal native vessels, especially the RCA and also  the circumflex.  He will need continued risk factor modification and weight  loss.  A total of 125 mL of contrast was utilized for interventional  procedure.   The right femoral artery access was closed with Starclose.   TECHNIQUES OF PROCEDURE:  In the usual sterile technique, using a 7 French  right femoral artery access, a 6 LIMA catheter was advanced into the  ascending aorta over a 0.035 inch J-wire.  The catheter was gently  manipulated to engage the LIMA selectively.  Angiography was performed.  Then, 190 cm by 0.014 inch Asahi soft wire was utilized to cross into the  LAD and a 2 by 6 mm cutting balloon was utilized.  I had difficulty in  advancing the cutting balloon through the tight stenosis, however, I was  successful and performed two inflations at 5 atmospheric pressures for 90  seconds and then at 8 atmospheres pressure for 90 seconds was performed.  Then, at 8 atmospheres pressure for 90 seconds was performed.  Excellent  result was noted.  Then, the guide catheter was disengaged and  advanced to  the ascending aorta over the guide-wire and the SVG to RCA was selectively  cannulated.  Then, using a EZ filter for distal protection, the filter was  deployed, the position of the filter was confirmed by two orthogonal views  and then the 4.5 by 24 mm Liberty stent was advanced into the lesion and the  stent was deployed at 12 atmospheres pressure for 100 seconds.  Then, the  balloon was deflated and pulled back into the guiding catheter and 200 mcg  intracoronary nitroglycerin was administered.  Angiography was repeated.  Overall excellent results were noted.  During the procedure, multiple  administrations of intracoronary nitroglycerin was administered both into  the left system and also into the right system . There were no immediate  complications noted.  The patient tolerated the procedure well.      Cristy Hilts. Jacinto Halim, MD  Electronically Signed     JRG/MEDQ  D:  09/23/2004  T:  09/23/2004  Job:  [id number]

## 2010-07-01 NOTE — Discharge Summary (Signed)
[name], [name]              ACCOUNT NO.:  [id number]   MEDICAL RECORD NO.:  [id number]          PATIENT TYPE:  INP   LOCATION:  1410                         [facility]ACILITY:  [facility]   PHYSICIAN:  Bertram Kentarius. Dahlstedt, M.D.DATE OF BIRTH:  28-Dec-1940   DATE OF ADMISSION:  08/01/2006  DATE OF DISCHARGE:  08/04/2006                               DISCHARGE SUMMARY   ADMISSION DIAGNOSIS:  Left ureteral stone.   DISCHARGE DIAGNOSIS:  Left ureteral stone.   SECONDARY DIAGNOSES:  1. Hypertension, poorly controlled.  2. Diabetes  3. Coronary artery disease.  4. Gastroesophageal reflux disease.  5. Sleep apnea.   PROCEDURE PERFORMED:  Left open ureterolithotomy with flexible  ureteroscopy on August 01, 2006.   HISTORY OF PRESENT ILLNESS:  [name] is a 70 year old male who has a  history of bilateral kidney stones.  He has had large left-sided  ureteral stones and has undergone shock wave lithotripsy and  ureteroscopic stone manipulation.  He has continued to have large stone  burden and was counseled regarding management options.  He has agreed to  proceed with an open procedure and is admitted this hospitalization.   HOSPITAL COURSE:  The patient was brought to the hospital on August 01, 2006, and taken to the operating room for procedure described above.  This was an uncomplicated procedure and he was sent to the floor in  stable condition.  Once on the floor, he had an uneventful postoperative  course.  His diet was slowly advanced and he ambulated without  difficulty.  He did have some increased drainage from his JP which was  evaluated for creatinine and was consistent with serum.  His Foley  catheter was therefore removed.  He did have some significant  hypertension with systolic in the high 180s and 11B to 200 range.  He  was controlled with p.r.n. hydralazine and medicine consult was  obtained.  Medications were adjusted.  He was given a prescription for  Avalide 300/12.5 to  replace Avapro.  He was continued on his previous  medications by medicine.  He was asked to follow up with his primary  care Sabas Frett.  On postoperative day #3 he was stable and was deemed  ready for discharge.  His JP drain was removed.   At the time of discharge the patient was afebrile with vital signs  stable.  In general, he was in no acute distress.  Heart was regular,  lungs were clear, his abdomen was soft, nontender, his incision was  clean, dry, intact.   DISPOSITION AND DISCHARGE INSTRUCTIONS:  [name] is discharged to  home in stable condition.  He is to resume his previous diet.  Activity  level:  He was instructed to slowly increase his activity but not to do  any strenuous activity for several weeks.  He was to call with any  questions or concerns regarding his care, specifically any fever greater  than 101.5 or increased abdominal pain, redness or drainage from the  incision, or significant nausea or vomiting.  He understood and agreed  to these instructions.   MEDICATIONS AT DISCHARGE:  1. Avalide 300/12.59.  2. Coreg 25 daily.  3. Norvasc daily.  4. Vytorin.  5. Actos.  6. Aspirin.  7. Plavix.  8. Cipro 500 mg p.o. b.i.d. x3 days.   He will follow up with Dr. Retta Diones in 1 week for further evaluation  and likely ureteral stent removal.     ______________________________  Terie Purser, MD      Bertram Rafiq. Dahlstedt, M.D.  Electronically Signed    JH/MEDQ  D:  08/10/2006  T:  08/10/2006  Job:  [id number]

## 2010-07-01 NOTE — Op Note (Signed)
NAMEELMO, RIO              ACCOUNT NO.:  [id number]   MEDICAL RECORD NO.:  [id number]          PATIENT TYPE:  AMB   LOCATION:  [facility]                         FACILITY:  [facility]   PHYSICIAN:  Maretta Bees. Vonita Moss, M.D.DATE OF BIRTH:  [birth year]   DATE OF PROCEDURE:  12/18/2005  DATE OF DISCHARGE:                                 OPERATIVE REPORT   PREOPERATIVE DIAGNOSIS:  Left ureteral stones.   POSTOPERATIVE DIAGNOSIS:  Left ureteral stones.   PROCEDURES:  1. Cystoscopy.  2. Left ureteroscopy.  3. Holmium laser fragmentation.  4. Retrograde pyelogram with interpretation.  5. Left double J catheter insertion.   SURGEON:  Maretta Bees. Vonita Moss, M.D.   ANESTHESIA:  General.   INDICATIONS:  This 70 year old gentleman has had a long history of stone  disease and had two large distal ureteral stones discovered with significant  hydronephrosis.  He was brought to the OR for follow-up on a recent left  ureteroscopy and fragmentation and basketing of the more distal stone and  partial fragmentation of the more proximal stone.  He has had a double J  catheter in place and is brought back for a second look ureteroscopy.   PROCEDURE:  The patient was brought to the operating room and placed in  lithotomy position.  External genitalia were prepped and draped in the usual  fashion.  He was cystoscoped and the double J catheter was grasped with a  forceps and brought out per urethra and a sensor wire placed up the left  ureter without difficulty.  With the guidewire in place, I inserted a 6  French rigid ureteroscope and then basketed out two small stones in the  distal ureter.  I then manipulated the scope up to the stone which was still  irregular, partially fragmented, and impacted in the bladder wall.  I tried  to dislodge it and remove it with the basket but it seemed adherent and  stuck to the ureteral wall.  I then utilized the holmium laser to continue  fragmentation of this residual  stone.  When I got to the point where there  was a poor angle since it was embedded in the wall, I was able to rotate the  scope and actually get the laser probe on top of the guidewire to deflect it  and get some significant fragmentation on this stone.  I was able to  manipulate the ureteroscope above and beyond the stone but still could not  get a better angle than I had from below.  Some further stone fragmentation  was performed, but fragmentation at this point was still incomplete as there  was felt to be one significant fragment left.  Since I had been working a  while, I injected contrast retrograde through the ureteroscope and there was  a minor amount of extravasation near the site of this stone.  At this point  I was considering using a flexible ultra-thin ureteroscope but I was  concerned about the extravasation, and there was some bleeding at this  point.  I am not sure I would have been able to see very  well with lower  flow through the smaller  flexible scope, so I felt it best at this point just to put in another 6  French 26 cm Contour double J catheter, and I will see him in a few days,  get x-rays, and see if the residual stone fragments have passed and consider  the next step, whether it be similar watchful waiting, ESL, or a repeat  ureteroscopy.      Maretta Bees. Vonita Moss, M.D.  Electronically Signed     LJP/MEDQ  D:  12/18/2005  T:  12/19/2005  Job:  [id number]

## 2010-07-01 NOTE — Discharge Summary (Signed)
[place]  Patient:    [name], [name]                     MRN: [id number] Adm. Date:  [id number] Disc. Date: [id number] Attending:  Sypher, Douglass Rivers CC:         [name]. [name]., M.D. (2)                           Discharge Summary  ADMITTING DIAGNOSIS:  Severe glenohumeral degenerative arthritis.  SURGERY:  Right total shoulder implant arthroplasty on Jun 17, 1999.  DISCHARGE MEDICATIONS: 1. Tylox one or two tablets p.o. q.6h. p.r.n. pain, 20 tablets without refill. 2. Keflex 500 mg one p.o. q.8h. x 4 days as a prophylactic antibiotic.  [name] will remain on his usual medications.  LABORATORY DATA:  At the time of admission, [name] was noted to have a CBC with a white count of 9300, a hemoglobin of 15.1 gm percent, and a normal differential.  A comprehensive metabolic panel revealed normal values throughout.  His prothrombin time was 13.4. seconds with an INR of 1.1.  His urinalysis revealed normal pH and specific gravity, and negative for blood ketones and protein.  His preoperative chest x-ray was negative for active disease and shows signs of previous coronary artery bypass surgery and a previous left total shoulder arthroplasty.  His EKG was reviewed from his prior surgery and deemed satisfactory by the anesthesia service.  HOSPITAL COURSE:  [name] was admitted on the morning of Jun 17, 1999, for a right total shoulder implant arthroplasty.  The surgery was accomplished without complication with loss of approximately 400 cc of blood.  His blood loss was replaced with crystalloid.  Postoperatively, he was observed for 48 hours with a Foley catheter in place for 24 hours.  His Hemovac was discontinued on the first postoperative day after 300 cc of sanguineous drainage.  His dressing was changed on the first postoperative day and he was found to be healing without signs of infection.  He began range  of motion exercises to his elbow, wrist, and fingers on the first postoperative day and pendulum exercises to the shoulder on the second postoperative day.  He remained afebrile and was able to tolerate his discomfort with oral medications only.  IV prophylactic antibiotics in the form of Ancef were administered for 48 hours.  His IV was discontinued on Jun 19, 1999, and he was discharged in the care of his family in stable condition with the aforementioned prescriptions.  He will return to our office for follow-up on Jun 22, 1999, and will contact us sooner p.r.n. problems such as fever, drainage from the wound, increasing pain, or numbness. DD:  06/19/99 TD:  06/20/99 Job: [id number]

## 2010-07-01 NOTE — Discharge Summary (Signed)
NAME:  [name], [name]                        ACCOUNT NO.:  [id number]   MEDICAL RECORD NO.:  [id number]                   PATIENT TYPE:  INP   LOCATION:  3101                                 FACILITY:  [facility]   PHYSICIAN:  Cristy Hilts. Jacinto Halim, M.D.                  DATE OF BIRTH:  [birth year]   DATE OF ADMISSION:  06/01/2003  DATE OF DISCHARGE:  06/02/2003                                 DISCHARGE SUMMARY   ADMISSION DIAGNOSES:  1. Uncontrolled hypertension with hypertensive urgency.  2. History of coronary artery disease with history of coronary artery bypass     graft in 1997.  3. Hyperlipidemia.  4. Diabetes mellitus type 2.  5. Obesity.   DISCHARGE DIAGNOSES:  1. Uncontrolled hypertension with hypertensive urgency.  Now controlled at     time of discharge home.  2. History of coronary artery disease with history of coronary artery bypass     graft in 1997.  3. Hyperlipidemia.  4. Diabetes mellitus type 2.  5. Obesity.   HISTORY OF PRESENT ILLNESS:  [name] is a 70 year old white male with a  history of CAD, status post CABG in 1997.  He had been doing well until the  morning of June 01, 2003.  At that time, he had an episode of chest  discomfort lasting about three hours.   He presented to the office for evaluation on that day on June 01, 2003.  He  denied any associated shortness of breath.  He denied any associated  diaphoresis, palpitations, or dizziness.  He states that the pain was right  in the middle of his chest with no significant radiation.   By the time he came to the office, he said his chest pain had pretty well  resolved.  He had no recent chest pains in the last six months or so and no  problems with palpitations, no PND, and no orthopnea.   In the office on exam, his blood pressure was elevated at 200/98 on the  right and 200/98 on the left.  EKG showed sinus rhythm, LVH, repolarization  changes.  No changes compared to prior EKG of May 01, 2002.   At  that time, in regards to his uncontrolled hypertension, Dr. Cristy Hilts. Ganji  gave him a tablet of 6.25 mg of Coreg as well as a tablet of 300 mg of  Avapro there in the office. He then sat in the office for about 20 minutes,  had his blood pressure subsequently checked.  Ultimately his blood pressures  did not decrease to a safe measurement.  Ultimately, we had to recommend him  to be admitted to the hospital for further treatment and management of his  hypertension.   In regard to his chest pain, we plan to see him back in follow-up and  reevaluate possible need for a follow-up Cardiolite scan.   HOSPITAL COURSE:  Upon admission,  Dr. Cristy Hilts. Ganji planned to put him on IV  Cardizem to titrate for blood pressure control.  As well, he was put on  Coreg 12.5 mg b.i.d. and Avapro 300 mg q.d.   Subsequently, the IV Cardizem was able to be titrated off at about 2 a.m. on  the morning of June 02, 2003 as his blood pressure was well controlled.   Therefore, on the morning of June 02, 2003 at the time of our rounds, his  blood pressure is well controlled with the addition of Coreg and Avapro.  His blood pressures at around midnight last night were about 140. They  actually dropped down in to the 80s about 3 a.m.  At the time of our  evaluation at 8 a.m. on June 02, 2003, blood pressure is 115/55, heart rate  58.  He is afebrile at 97.4.  Oxygen saturation 97% on room air.  At this  point, [name] says he feels well.  He says that he did feel lightheaded  last night when the blood pressure got very low.  No other complaints.  Heart is in regular rhythm.  Lungs are clear.  No significant lower  extremity edema.   At this point, he is seen and evaluated by Dr. Cristy Hilts. Ganji and deemed  stable for discharge home.   PROCEDURE:  None.   CONSULTATIONS:  None.   LABORATORY DATA:  Sodium 138, potassium 3.9, chloride 104, CO2 24, glucose  90, BUN 17, creatinine 1.1.  Liver function tests normal.   White count 8.3,  hemoglobin 15.2, hematocrit 44.1, platelets 275,000.  Differential normal.  Cardiac enzymes negative with CK 131, MB 1.8, troponin less than 0.01.   Telemetry has shown normal sinus rhythm, no arrhythmia.   DISCHARGE MEDICATIONS:  1. Avapro 300 mg at night.  2. Coreg 12.5 mg b.i.d.  3. Aspirin 81 mg q.d.  4. Actos 15 mg q.d.  5. Zocor 1 q.d.  6. Uricet 1 q.d.  7. Allopurinol 1 q.d.  8. Stop metoprolol.   DIET:  Low sodium, low cholesterol, diabetic diet.   FOLLOW UP:  Call [phone] and speak to our nurse if your blood pressure is  elevated (greater than 160/90).  Keep your appointment for your renal MRA  which has already been scheduled.  Keep your appointment to see Dr. Cristy Hilts.  Ganji June 09, 2003 at 10 a.m.      Mary B. Remer Macho, P.A.-C.                   Cristy Hilts. Jacinto Halim, M.D.    MBE/MEDQ  D:  06/02/2003  T:  06/04/2003  Job:  [id number]   cc:   Cristy Hilts. Jacinto Halim, M.D.  [address]  [place]  [place] [address]  Fax: [phone]

## 2010-07-01 NOTE — Discharge Summary (Signed)
NAME:  RAYMON, SCHLARB                        ACCOUNT NO.:  [id number]   MEDICAL RECORD NO.:  [id number]                   PATIENT TYPE:  INP   LOCATION:  0356                                 FACILITY:  [facility]   PHYSICIAN:  Maretta Bees. Vonita Moss, M.D.             DATE OF BIRTH:  1940-07-22   DATE OF ADMISSION:  06/09/2002  DATE OF DISCHARGE:                                 DISCHARGE SUMMARY   FINAL DIAGNOSES:  1. Large left renal calculi.  2. Renal cyst.  3. Microhematuria due to #1.  4. Hypertension.  5. Coronary artery disease.  6. Hypercholesterolemia.  7. Diabetes mellitus.   PROCEDURE:  Left percutaneous nephrolithotomy June 09, 2002.   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 He has had previous lithotripsies and stone passage and a right  ureterolithotomy. A CT scan showed large upper and lower pole, left renal  calculi and he was admitted for a percutaneous nephrolithotomy.   PHYSICAL EXAMINATION:  Noncontributory.   HOSPITAL COURSE:  After admission, he underwent a percutaneous stick in  radiology to the left kidney and he had a very small collecting system and  access was obtained medial to the lower pole stone. He was brought to the  operating room and left percutaneous nephrolithotomy was performed but  because of the more medial placement of the stick and small renal pelvis,  access was somewhat limited and a good deal of the lower pole stone was felt  to be removed; but because of the difficulty and length of the procedure,  the surgery was stopped short of complete stone removal. The left  percutaneous nephrostomy tube was left indwelling along with a ureteral  access catheter through the flank. His postop course was benign with very  little pain, his hemoglobin stabilized at 11.2, he was treated with a  diabetic diet and one initial dose of subcu  insulin. He was voiding well  when his Foley catheter was removed one day postop. He was afebrile on the  date of discharge with pink tinged urine in his nephrostomy tube. He will go  home on limited activity and diabetic diet. He will go home on ferrous  sulfate 25 mg b.i.d. after meals and Vicodin for pain although he has really  had no pain in the hospital. He also continues 150 mg Avapro, 10 mg Lipitor,  15 mg Actos, 50 mg metoprolol at home. He was sent home in satisfactory  condition. He will return to the office in two days for followup for repeat  x-rays and reevaluation of the next steps in stone removal.  Maretta Bees. Vonita Moss, M.D.    LJP/MEDQ  D:  06/11/2002  T:  06/11/2002  Job:  [id number]

## 2010-07-01 NOTE — Discharge Summary (Signed)
NAMETYLAR, AMBORN              ACCOUNT NO.:  [id number]   MEDICAL RECORD NO.:  [id number]          PATIENT TYPE:  OBV   LOCATION:  6533                         FACILITY:  [facility]   PHYSICIAN:  Cristy Hilts. Jacinto Halim, MD       DATE OF BIRTH:  1940/07/05   DATE OF ADMISSION:  09/23/2004  DATE OF DISCHARGE:  09/24/2004                                 DISCHARGE SUMMARY   ADMISSION DIAGNOSES:  1.  Exertional chest pain with positive Cardiolite.  2.  Recent cardiac catheterization showing progressive coronary artery      disease.  3.  Status post coronary artery bypass grafting x5 in 1987.  4.  Hypertension.  5.  Dyslipidemia.  6.  Adult onset diabetes mellitus type 2.  7.  Obesity.   DISCHARGE DIAGNOSES:  1.  Exertional chest pain with positive Cardiolite.  2.  Recent cardiac catheterization showing progressive coronary artery      disease.  3.  Status post coronary artery bypass grafting x5 in 1987.  4.  Hypertension.  5.  Dyslipidemia.  6.  Adult onset diabetes mellitus type 2.  7.  Obesity.   PROCEDURE:  1.  Repeat cardiac catheterization PCI cutting balloon to the native left      anterior descending through the left internal mammary artery.  2.  PCI and stenting saphenous vein graft to the distal right coronary      artery with a Lieberte stent September 23, 2004, Dr. Jacinto Halim.   HISTORY OF PRESENT ILLNESS:  The patient is a 70 year old gentleman who  presented with known coronary artery disease and prior coronary artery  bypass grafting as described above. He underwent a Cardiolite study, which  was positive and subsequently underwent cardiac catheterization. Because of  renal and diabetic issues, the cardiac catheterization and the PCI were  decided to be done as separate events. The catheterization revealed that the  patient's left anterior descending was totally occluded in the native  proximal portion of the vessel. The left internal mammary artery was  inserted into the left  anterior descending below the occlusion but there was  a 95% stenosis of the left anterior descending, distal to the left internal  mammary artery insertion. The left circumflex had severe disease, 80% to  90%, before the obtuse marginal bifurcation. The obtuse marginal had 70%  disease, saphenous vein graft to the obtuse marginal was totally occluded.  The right coronary artery had a proximal total occlusion. The saphenous vein  graft to the distal right was patent but it had a proximal 80% stenosis and  a 50% to 50% stenosis at the insertion.   HOSPITAL COURSE:  On this admission date, she was taken to the  catheterization lab and Dr. Jacinto Halim did a PCI to the left anterior descending  via the left internal mammary. He used a 2 x 6 mm cutting balloon and the  stenosis went from 95% to 0. He also did PCI and stenting of the proximal  saphenous vein graft to the right coronary artery and went from 80% to 0.  The patient tolerated the procedure  well. He returned to the floor. He was  seen the following day by Dr. Jacinto Halim. It was his opinion that overall he was  doing well and could be discharged home. During his hospitalization, he was  seen by Dr. Sherryll Burger, who has helped with his diabetes. He will continue his  current medications but he has added metformin 500 mg b.i.d. starting  Monday.   DISCHARGE MEDICATIONS:  1.  Metformin 500 mg b.i.d. a.c. starting Monday (this is new).  2.  Actos 15 mg daily.  3.  Plavix 75 mg 1 daily (this is new).  4.  Aspirin, dose is increased to 325 mg daily.  5.  Avapro 300 mg daily.  6.  Allopurinol 300 mg daily.  7.  Norvasc 5 mg daily.  8.  Vytorin 10/40 1 daily.  9.  Tricor 45 mg 1 daily.   ACTIVITY:  The patient is to increase his activity slowly over the next 72  hours.   DIET:  He is to maintain a low-fat diabetic diet.   FOLLOW UP:  1.  He will return to see Dr. Jacinto Halim in approximately 2 weeks.  2.  He is to followup with Dr. Sherryll Burger as  instructed.   LABORATORY DATA:  Hemoglobin and hematocrit 13.2 and 39. White count 6.4.  Platelets 220,000. Sodium 137, potassium 4.4, chloride 104, CO2 26, BUN 20,  creatinine 1, glucose 110.   CONDITION ON DISCHARGE:  Improving.      Eber Hong, P.A.      Cristy Hilts. Jacinto Halim, MD  Electronically Signed    WDJ/MEDQ  D:  09/24/2004  T:  09/24/2004  Job:  [id number]

## 2010-07-01 NOTE — Op Note (Signed)
NAME:  [name], [name]                        ACCOUNT NO.:  [id number]   MEDICAL RECORD NO.:  [id number]                   PATIENT TYPE:  INP   LOCATION:  0001                                 FACILITY:  [facility]   PHYSICIAN:  Maretta Bees. Vonita Moss, M.D.             DATE OF BIRTH:  [birth year]   DATE OF PROCEDURE:  06/09/2002  DATE OF DISCHARGE:                                 OPERATIVE REPORT   PREOPERATIVE DIAGNOSIS:  Left renal calculi.   POSTOPERATIVE DIAGNOSIS:  Left renal calculi.   PROCEDURE:  Left percutaneous nephrolithotomy.   SURGEON:  Maretta Bees. Vonita Moss, M.D.   ASSISTANT:  Dwyane Luo. Fischer, M.D.   ANESTHESIA:  General.   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 Lately he has developed gross  hematuria when working in his yard and CT scan and KUB showed large faintly  opacified stones in the upper and lower pole of the left kidney. He and his  wife were counseled about therapies and these stones were too large and  maybe a little too faint to see for lithotripsy. They were counseled about  percutaneous nephrolithotomy and postop nephrostomy tube and the possibility  of a second procedure. They were also told about the risk of hemorrhage and  infection. He was cleared surgically by his cardiologist, Dr. Jacinto Halim.   DESCRIPTION OF PROCEDURE:  The patient was brought to the operating room and  after having been in x-ray where Dr. Elly Modena had a very difficult time  gaining access to the renal pelvis by percutaneous techniques, he was  finally able to access a relatively small renal pelvis and guidewires were  placed down in the bladder and over that a percutaneous ureteral catheter  was utilized for access when he was brought to the operating room. He was  brought to the operating room, given general anesthesia and the Foley  catheter inserted and he was placed in the prone position and the left flank  prepped and draped in the usual fashion. A guidewire was placed down the  left ureter into the renal pelvis and dilating sheath were utilized to  insert a second guidewire into the renal pelvis down the ureter into the  bladder. The Amplatz dilating balloon and sheath were then utilized to gain  access to the kidney. He was scoped and it was felt that the balloon had not  dilated the renal pelvic mucosa to afford complete access, so the balloon  was reinserted and placed a little further into the kidney toward the UPJ  and this time access to the renal pelvis was obtained and it was difficult  to work in this small renal pelvic area but we were able to gain access to  what was felt to be the lower pole stone. The difficulty was that the stone  was very nonopaque on fluoroscopy and distant to  visualize. The stone was  quite firm took some tedious work with the ultrasonic lithotriptor to  fragment the stone and also one fragment of stone was removed with a  grasper. However, at this time, the visualization in the renal pelvis and  the stone was getting a little bit more difficult because of some bleeding  in the kidney. It was felt that even though, there was incomplete stone  fragmentation at this time that it was in the best interest of the patient  to stop the procedure at this time since I know we will need to come back.  He will need a second procedure __________ with the rest of this stone.  Contrast was injected into the renal pelvis and it did appear as if the  upper pole stone was outlined and indeed it was a lower pole stone that we  worked on especially with the angle of the scope that was utilized to do  that. Contrast was then injected into the renal pelvis through a Councill  tip catheter with the tip cut off and the renal pelvis was intact and 3 mL  was placed in the balloon and the Councill tip catheter was sutured in place  at skin level with black silk and over the last  remaining safety wire an  angiographic catheter was placed in the upper left ureter for subsequent  access and this catheter was secured by the suture that was utilized in the  nephrostomy tube and was also taped to the nephrostomy tube. The nephrostomy  tube was connected to closed drainage, wound was cleaned, dressed with dry  sterile gauze dressings and he was to the recovery room in good condition  where we will check his hemoglobin.                                               Maretta Bees. Vonita Moss, M.D.    LJP/MEDQ  D:  06/09/2002  T:  06/10/2002  Job:  [id number]

## 2010-07-01 NOTE — Op Note (Signed)
[name]. [facility]  Patient:    [name], [name]                     MRN: [id number] Proc. Date: 06/17/99 Adm. Date:  [id number] Disc. Date: [id number] Attending:  Sypher, Douglass [name] Dictator:   [name] Fitch. Sypher, [name] Hageman., M.D. CC:         [name] Bridgeman. [name] Harman., M.D.             [name] Fitch Sypher, [name] Hageman., M.D. 2 copies             Anesthesia                           Operative Report  PREOPERATIVE DIAGNOSIS:  End-stage glenohumeral arthritis, right shoulder, with severe internal rotation contracture of shoulder and inability to abduct of rotate without severe pain.  POSTOPERATIVE DIAGNOSIS:  End-stage glenohumeral arthritis, right shoulder, with severe internal rotation contracture of shoulder and inability to abduct of rotate without severe pain.  PROCEDURE:  Total shoulder arthroplasty, right shoulder, with an 11 mm press fit bimodular stem, a 48 x 19 mm humeral head component, and a medium polyethylene glenoid component.  SURGEON:  Dr. Josephine Igo.  ASSISTANT:  [name], M.D.  ANESTHESIA:  General orotracheal.  ANESTHESIOLOGIST:  [name] Hora. Gelene Mink, M.D.  ESTIMATED BLOOD LOSS:  400 cc.  REPLACEMENT:  See anesthesia sheet.  DRAINS:  One medium Hemovac to suction.  INDICATIONS:  [name] is a 70 year old man who has had severe bilateral glenohumeral degenerative arthritis.  He is status post left total shoulder arthroplasty on April 13, 1999.  He had a very satisfactory postoperative result, and requested identical surgery on the right.  DESCRIPTION OF PROCEDURE:  [name] was brought to the operating room and placed in supine position on operating table.  Following induction of general orotracheal anesthesia, he was carefully positioned in the beach chair position with the aid of a torso and head holder designed for shoulder arthroscopy and shoulder procedures.  The right upper extremity and four quarter  were prepped with Duraprep, and then draped with impervious arthroscopy drapes.  The procedure commenced with a deltopectoral incision extending from the clavicle to the axillary fold.  Subcutaneous tissues were clipped and divided with electrocautery, taking care to identify the cephalic vein.  The interval between the pectoralis major and deltoid was bluntly dissected, and lateral branches of the cephalic vein were electrocauterized and suture ligated.  The cephalic vein was retracted medially.  A small portion of the pectoralis major tendon was released distally with electrocautery to facilitate exposure of the glenohumeral joint.  The conjoin tendon was identified after releasing the clavipectoral fascia, and the short head of the biceps tendon was 3/4 released with electrocautery and tagged.  The shoulder capsule was identified and the subscapularis tendon taken down with a cutting cautery.  This was tagged with a #2 Ethibond suture.  There was a very severe osteophyte at the base of the humeral head which was removed with rongeurs and an osteotome.  The head was delivered by dislocating the shoulder, and further osteophytes removed.  The humeral head was then transected with a 35 degree retroversion cut with an oscillating saw, taking care to protect the axillary nerve with a Joker elevator, and the rotator cuff with a Geographical information systems officer.  The head was removed and measured to be roughly 48 mm x 20 mm.  The glenoid was repaired in the usual manner with removal of the labrum with a rongeur followed by decortication with a power burr, duration of a channel for the fit of the glenoid implant, and a total of five additional drill holes for cement fixation.  The humeral head was then prepared with sequential reaming to a 12 mm stem and use of broaches.  The 11 mm broach appeared to appropriately accommodate the anatomy of the proximal humerus.  We attempted a 12, but found it to be  unable to be seated properly.  The capsule was gently loosened with a Therapist, nutritional to facilitate placement of the implants.  After thorough irrigation with saline and triple antibiotic solution, the glenoid component was placed with a small amount of cement and held for 8 minutes as the cement set.  Excess cement was removed with curettes.  The patients stem was then placed and press fit, ultimately using an 11 mm ______ coated implant.  Satisfactory inset was achieved.  With moderate difficulty, the humeral head implant was placed due to capsular tightness.  The 48 x 19 mm head was appropriate.  Once this was placed, capsular tightness was checked and found to be satisfactory.  The rotator cuff was intact, although thinned superiorly.  A loss defect in the margin of the humeral head were removed prior to capsular closure.  The subscapularis was repaired with a series of mattress sutures of #2 Ethibond to bone, and the rotator interval was repaired with mattress suture of #2 Ethibond.  A medium Hemovac drain was placed, and the bursa was cleared with digital dissection.  The wound was closed in layers with 0 Vicryl, closing subcutaneous fat, 2-0 Vicryl running subdermal closure, and an intradermal 0 Prolene with Steri-Strips.  The Hemovac drain was brought through a lateral stab wound, and was placed at the level of the capsule.  There were no apparent complications during the procedure.  Estimated blood loss was approximately 400 cc.  Prior to surgery, Mr. [name] had -5 degrees of external rotation.  After repair of the subscapularis we could achieve approximately 25 degrees of external rotation, and elevation to 150 degrees.  He was placed in a sling, awakened from anesthesia, and transferred to the recovery room with stable vital signs.  He will be admitted to the hospital for intravenous antibiotic therapy in the form of Ancef 1 g IV q.8h., appropriate analgesics, in the  form of PCA morphine and IV Dilaudid, and his  routine medications for hypertension and coronary artery disease. DD:  06/17/99 TD:  06/20/99 Job: [id number]

## 2010-07-01 NOTE — H&P (Signed)
NAME:  [name], [name]                        ACCOUNT NO.:  [id number]   MEDICAL RECORD NO.:  [id number]                   PATIENT TYPE:  INP   LOCATION:  0001                                 FACILITY:  [facility]   PHYSICIAN:  Maretta Bees. Vonita Moss, M.D.             DATE OF BIRTH:  02/15/1940   DATE OF ADMISSION:  06/09/2002  DATE OF DISCHARGE:                                HISTORY & PHYSICAL   REASON FOR ADMISSION:  This 70 year old white male has had a long history of  urinary tract calculi having had spontaneous passage of 15 stones before in  the right ureteral lithotomy and two lithotripsies.  He has been having  microhematuria and pain and gross hematuria following working in his yard.  He has had no significant flank pain.  He underwent a hematuria work-up and  BUN and creatinine were normal.  PSA actually was also done at the same time  and was normal.  A CT scan showed a large calculi in the upper and lower  pole of left kidney and KUB showed these large left renal calculi, no stones  on the right side.  He also had benign renal cysts.  I counseled him and his  wife about percutaneous nephrolithotomy and possibility of a second look  procedure and two to five day hospital day and the risks of hemorrhage,  infection.  He has had a previous coronary artery bypass graft and was  cleared by Cristy Hilts. Jacinto Halim, M.D. who is his cardiologist.   PAST MEDICAL HISTORY:  1. Hypertension.  2. Coronary artery disease.  3. Hypercholesterolemia.  4. Mild diabetes.   PAST SURGICAL HISTORY:  1. Coronary artery bypass grafting.  2. Shoulder surgery.   SOCIAL HISTORY:  He does not smoke cigarettes, but drinks some alcohol.   MEDICATIONS:  1. Avapro 150 mg daily.  2. Lipitor 10 mg daily.  3. Actos 15 mg daily.  4. Metoprolol 50 mg daily.  5. He was on aspirin 81 mg which he stopped three days ago.   ALLERGIES:  Denied.   FAMILY HISTORY:  Noncontributory.   REVIEW OF SYSTEMS:  Noted on  health history form.   PHYSICAL EXAMINATION:  VITAL SIGNS:  Blood pressure 160/96, pulse 16,  temperature 97.8.  GENERAL:  He is alert and oriented.  SKIN:  Warm and dry.  NECK:  Supple.  CHEST:  Clear.  HEART:  Tones regular.  ABDOMEN:  Soft, nontender.  GENITOURINARY:  External genitalia unremarkable.  RECTAL:  Prostate feels benign, smooth.   IMPRESSION:  1. Left renal calculi.  2. Renal cysts.  3. Microhematuria due to number one.  4. Hypertension.  5. History coronary disease.  6. Hypercholesterolemia.   PLAN:  Left percutaneous nephrolithotomy.  Maretta Bees. Vonita Moss, M.D.    LJP/MEDQ  D:  06/09/2002  T:  06/09/2002  Job:  [id number]   cc:   Gaspar Garbe, M.D.  [address]  [place]  [place] [address]  Fax: [phone]   Cristy Hilts. Jacinto Halim, M.D.  [address]  [place]  [place] [address]  Fax: [phone]

## 2010-08-04 ENCOUNTER — Other Ambulatory Visit: Payer: Self-pay | Admitting: Dermatology

## 2010-10-27 ENCOUNTER — Encounter: Payer: Self-pay | Admitting: Gastroenterology

## 2010-11-07 LAB — POCT I-STAT, CHEM 8
Glucose, Bld: 148 — ABNORMAL HIGH
HCT: 42
Hemoglobin: 14.3
Potassium: 4
Sodium: 136
TCO2: 24

## 2010-11-07 LAB — URINALYSIS, ROUTINE W REFLEX MICROSCOPIC
Bilirubin Urine: NEGATIVE
Hgb urine dipstick: NEGATIVE
Nitrite: NEGATIVE
Protein, ur: NEGATIVE
Specific Gravity, Urine: 1.019
Urobilinogen, UA: 0.2

## 2010-11-07 LAB — DIFFERENTIAL
Basophils Relative: 0
Lymphs Abs: 1.7
Monocytes Relative: 8
Neutro Abs: 7.3
Neutrophils Relative %: 74

## 2010-11-07 LAB — POCT CARDIAC MARKERS
CKMB, poc: <1 — ABNORMAL LOW
Myoglobin, poc: 85.7
Operator id: 277751

## 2010-11-07 LAB — CBC
Platelets: 267
RBC: 4.33
WBC: 10

## 2010-11-30 LAB — CBC
HCT: 38.4 — ABNORMAL LOW
Hemoglobin: 13.3
MCHC: 34.6
MCV: 93.5
Platelets: 238
RDW: 12.8

## 2010-11-30 LAB — URINE MICROSCOPIC-ADD ON

## 2010-11-30 LAB — URINALYSIS, ROUTINE W REFLEX MICROSCOPIC
Bilirubin Urine: NEGATIVE
Glucose, UA: NEGATIVE
Hgb urine dipstick: NEGATIVE
Ketones, ur: NEGATIVE
Protein, ur: NEGATIVE
Urobilinogen, UA: 0.2

## 2010-11-30 LAB — BASIC METABOLIC PANEL
BUN: 15
BUN: 21
CO2: 22
Chloride: 103
Chloride: 106
GFR calc non Af Amer: 57 — ABNORMAL LOW
GFR calc non Af Amer: >60
Glucose, Bld: 115 — ABNORMAL HIGH
Glucose, Bld: 158 — ABNORMAL HIGH
Potassium: 3.8
Potassium: 4.4
Sodium: 139
Sodium: 140

## 2010-11-30 LAB — CREATININE, FLUID (PLEURAL, PERITONEAL, JP DRAINAGE): Creat, Fluid: 1.4

## 2010-11-30 LAB — TYPE AND SCREEN

## 2011-11-09 ENCOUNTER — Encounter: Payer: Self-pay | Admitting: Gastroenterology

## 2012-03-14 ENCOUNTER — Other Ambulatory Visit (HOSPITAL_COMMUNITY): Payer: Self-pay | Admitting: Cardiovascular Disease

## 2012-03-14 DIAGNOSIS — I359 Nonrheumatic aortic valve disorder, unspecified: Secondary | ICD-10-CM

## 2012-03-14 DIAGNOSIS — I2510 Atherosclerotic heart disease of native coronary artery without angina pectoris: Secondary | ICD-10-CM

## 2012-03-28 ENCOUNTER — Ambulatory Visit (HOSPITAL_COMMUNITY): Payer: Self-pay

## 2012-03-28 ENCOUNTER — Encounter (HOSPITAL_COMMUNITY): Payer: Self-pay

## 2012-04-04 ENCOUNTER — Ambulatory Visit (HOSPITAL_COMMUNITY)
Admission: RE | Admit: 2012-04-04 | Discharge: 2012-04-04 | Disposition: A | Discharge status: Home / Self Care | Payer: Medicare Other | Source: Ambulatory Visit | Attending: Cardiovascular Disease | Admitting: Cardiovascular Disease

## 2012-04-04 DIAGNOSIS — I10 Essential (primary) hypertension: Secondary | ICD-10-CM | POA: Insufficient documentation

## 2012-04-04 DIAGNOSIS — E119 Type 2 diabetes mellitus without complications: Secondary | ICD-10-CM | POA: Insufficient documentation

## 2012-04-04 DIAGNOSIS — I059 Rheumatic mitral valve disease, unspecified: Secondary | ICD-10-CM | POA: Insufficient documentation

## 2012-04-04 DIAGNOSIS — I369 Nonrheumatic tricuspid valve disorder, unspecified: Secondary | ICD-10-CM | POA: Insufficient documentation

## 2012-04-04 DIAGNOSIS — I2510 Atherosclerotic heart disease of native coronary artery without angina pectoris: Secondary | ICD-10-CM | POA: Insufficient documentation

## 2012-04-04 MED ORDER — TECHNETIUM TC 99M SESTAMIBI GENERIC - CARDIOLITE
11.0000 | Freq: Once | INTRAVENOUS | Status: AC | PRN
  Administered 2012-04-04: 11 via INTRAVENOUS

## 2012-04-04 MED ORDER — REGADENOSON 0.4 MG/5ML IV SOLN
0.4000 mg | Freq: Once | INTRAVENOUS | Status: AC
  Administered 2012-04-04: 0.4 mg via INTRAVENOUS

## 2012-04-04 MED ORDER — TECHNETIUM TC 99M SESTAMIBI GENERIC - CARDIOLITE
31.0000 | Freq: Once | INTRAVENOUS | Status: AC | PRN
  Administered 2012-04-04: 31 via INTRAVENOUS

## 2012-04-04 NOTE — Procedures (Addendum)
[facility] [address] [place] [address] [phone]  Cardiology Nuclear Med Study  [name] is a 72 y.o. male     MRN : [id number]     DOB: 1941/01/04  Procedure Date: 04/04/2012  Nuclear Med Background Indication for Stress Test:  Stent Patency and Abnormal EKG History:  CAD;CABG-1997;STENT PLACEMENT-07/2009 Cardiac Risk Factors: Family History - CAD, History of Smoking, Hypertension, Lipids, NIDDM and Obesity  Symptoms:  DENIES SYMPTOMS AT PRESENT   Nuclear Pre-Procedure Caffeine/Decaff Intake:  8:30pm NPO After: 6:30am   IV Site: R Hand  IV 0.9% NS with Angio Cath:  22g  Chest Size (in):  50" IV Started by: Emmit Pomfret, RN  Height: 5\' 11"  (1.803 m)  Cup Size: n/a  BMI:  Body mass index is 30.7 kg/(m^2). Weight:  220 lb (99.791 kg)   Tech Comments:  N/A    Nuclear Med Study 1 or 2 day study: 1 day  Stress Test Type:  Lexiscan  Order Authorizing Provider:  Benny Lennert   Resting Radionuclide: Technetium 32m Sestamibi  Resting Radionuclide Dose: 11.0 mCi   Stress Radionuclide:  Technetium 74m Sestamibi  Stress Radionuclide Dose: 31.0 mCi           Stress Protocol Rest HR: 64 Stress HR: 69  Rest BP:181/87 Stress BP: 182/76  Exercise Time (min): n/a METS: n/a          Dose of Adenosine (mg):  n/a Dose of Lexiscan: 0.4 mg  Dose of Atropine (mg): n/a Dose of Dobutamine: n/a mcg/kg/min (at max HR)  Stress Test Technologist: Ernestene Mention, CCT Nuclear Technologist: Gonzella Lex, CNMT   Rest Procedure:  Myocardial perfusion imaging was performed at rest 45 minutes following the intravenous administration of Technetium 21m Sestamibi. Stress Procedure:  The patient received IV Lexiscan 0.4 mg over 15-seconds.  Technetium 48m Sestamibi injected at 30-seconds.  There were no significant changes with Lexiscan.  Quantitative spect images were obtained after a 45 minute delay.  Transient Ischemic  Dilatation (Normal <1.22):  1.06 Lung/Heart Ratio (Normal <0.45):  0.27 QGS EDV:  85 ml QGS ESV:  33 ml LV Ejection Fraction: 61%  Signed by     Rest ECG: NSR, Incomplete RBBB, inferolateral T wave inversion  Stress ECG: No significant change from baseline ECG  QPS Raw Data Images:  Normal; no motion artifact; normal heart/lung ratio. Stress Images:  Normal homogeneous uptake in all areas of the myocardium. Rest Images:  Normal homogeneous uptake in all areas of the myocardium. Subtraction (SDS):  Normal  Impression Exercise Capacity:  Lexiscan with no exercise. BP Response:  Normal blood pressure response. Clinical Symptoms:  Mild shortness of breath ECG Impression:  Baseline inferolateral T wave inversion without new ECG changes with Lexiscan. Comparison with Prior Nuclear Study: No significant change from previous study  Overall Impression:  Normal stress nuclear study. Low risk stress nuclear study.  LV Wall Motion:  NL LV Function; NL Wall Motion   Lennette Bihari, MD  04/04/2012 1:38 PM

## 2012-04-04 NOTE — Progress Notes (Signed)
[facility]   2D echo completed 04/04/2012.   Cindy Schon Zeiders, RDCS  

## 2012-10-08 ENCOUNTER — Emergency Department (HOSPITAL_COMMUNITY): Payer: Medicare Other

## 2012-10-08 ENCOUNTER — Emergency Department (HOSPITAL_COMMUNITY)
Admission: EM | Admit: 2012-10-08 | Discharge: 2012-10-08 | Disposition: A | Discharge status: Home / Self Care | Payer: Medicare Other | Attending: Emergency Medicine | Admitting: Emergency Medicine

## 2012-10-08 ENCOUNTER — Encounter (HOSPITAL_COMMUNITY): Payer: Self-pay | Admitting: *Deleted

## 2012-10-08 DIAGNOSIS — Z9861 Coronary angioplasty status: Secondary | ICD-10-CM | POA: Insufficient documentation

## 2012-10-08 DIAGNOSIS — H539 Unspecified visual disturbance: Secondary | ICD-10-CM | POA: Insufficient documentation

## 2012-10-08 DIAGNOSIS — Z7982 Long term (current) use of aspirin: Secondary | ICD-10-CM | POA: Insufficient documentation

## 2012-10-08 DIAGNOSIS — Z951 Presence of aortocoronary bypass graft: Secondary | ICD-10-CM | POA: Insufficient documentation

## 2012-10-08 DIAGNOSIS — E119 Type 2 diabetes mellitus without complications: Secondary | ICD-10-CM | POA: Insufficient documentation

## 2012-10-08 DIAGNOSIS — R55 Syncope and collapse: Secondary | ICD-10-CM

## 2012-10-08 DIAGNOSIS — I10 Essential (primary) hypertension: Secondary | ICD-10-CM | POA: Insufficient documentation

## 2012-10-08 DIAGNOSIS — R0602 Shortness of breath: Secondary | ICD-10-CM | POA: Insufficient documentation

## 2012-10-08 DIAGNOSIS — Z79899 Other long term (current) drug therapy: Secondary | ICD-10-CM | POA: Insufficient documentation

## 2012-10-08 DIAGNOSIS — I2510 Atherosclerotic heart disease of native coronary artery without angina pectoris: Secondary | ICD-10-CM | POA: Insufficient documentation

## 2012-10-08 DIAGNOSIS — E785 Hyperlipidemia, unspecified: Secondary | ICD-10-CM | POA: Insufficient documentation

## 2012-10-08 DIAGNOSIS — R5381 Other malaise: Secondary | ICD-10-CM | POA: Insufficient documentation

## 2012-10-08 DIAGNOSIS — R112 Nausea with vomiting, unspecified: Secondary | ICD-10-CM | POA: Insufficient documentation

## 2012-10-08 DIAGNOSIS — R61 Generalized hyperhidrosis: Secondary | ICD-10-CM | POA: Insufficient documentation

## 2012-10-08 HISTORY — DX: Hyperlipidemia, unspecified: E78.5

## 2012-10-08 HISTORY — DX: Atherosclerotic heart disease of native coronary artery without angina pectoris: I25.10

## 2012-10-08 HISTORY — DX: Type 2 diabetes mellitus without complications: E11.9

## 2012-10-08 HISTORY — DX: Essential (primary) hypertension: I10

## 2012-10-08 LAB — COMPREHENSIVE METABOLIC PANEL
AST: 19 U/L (ref 0–37)
Albumin: 3.8 g/dL (ref 3.5–5.2)
BUN: 25 mg/dL — ABNORMAL HIGH (ref 6–23)
Calcium: 9.7 mg/dL (ref 8.4–10.5)
Chloride: 100 mEq/L (ref 96–112)
Creatinine, Ser: 1.13 mg/dL (ref 0.50–1.35)
Total Protein: 7.2 g/dL (ref 6.0–8.3)

## 2012-10-08 LAB — URINALYSIS, ROUTINE W REFLEX MICROSCOPIC
Glucose, UA: 100 mg/dL — AB
Hgb urine dipstick: NEGATIVE
Specific Gravity, Urine: 1.026 (ref 1.005–1.030)
pH: 5 (ref 5.0–8.0)

## 2012-10-08 LAB — CBC
MCHC: 36.5 g/dL — ABNORMAL HIGH (ref 30.0–36.0)
MCV: 90.8 fL (ref 78.0–100.0)
Platelets: 260 10*3/uL (ref 150–400)
RDW: 12.8 % (ref 11.5–15.5)
WBC: 11.1 10*3/uL — ABNORMAL HIGH (ref 4.0–10.5)

## 2012-10-08 LAB — POCT I-STAT TROPONIN I
Troponin i, poc: 0.01 ng/mL (ref 0.00–0.08)
Troponin i, poc: 0.02 ng/mL (ref 0.00–0.08)

## 2012-10-08 LAB — URINE MICROSCOPIC-ADD ON

## 2012-10-08 MED ORDER — SODIUM CHLORIDE 0.9 % IV SOLN
INTRAVENOUS | Status: DC
  Administered 2012-10-08: 14:00:00 via INTRAVENOUS

## 2012-10-08 MED ORDER — SODIUM CHLORIDE 0.9 % IV BOLUS (SEPSIS)
500.0000 mL | Freq: Once | INTRAVENOUS | Status: AC
  Administered 2012-10-08: 500 mL via INTRAVENOUS

## 2012-10-08 NOTE — ED Notes (Signed)
Pt c/o nausea and dry heaves. Pt states he had some cp yesterday, felt flushed, had nausea and vomiting. Pt thought he had acid reflux, did some work around the house and took some tums and felt like the pain eased off some. Pt states he checked his pressure and it was 210/190 yesterday. Today pt states he was doing some yard work and was carrying a log from one place to the next and states everything got black and like things were closing in on him, he got really diaphoretic, and had nausea and dry heaves. Pt denied any cp,sob, lightheadedness or dizziness. Pt denies any pain at this time.

## 2012-10-08 NOTE — ED Notes (Addendum)
Pt states he has been having tiredness and increased feeling of heart burn for about a week or longer. Pt states he has had 5 bypasses and a few stents. Now c/o leg pain.

## 2012-10-08 NOTE — ED Provider Notes (Signed)
CSN: [id number]     Arrival date & time 10/08/12  1237 History   First MD Initiated Contact with Patient 10/08/12 1318     Chief Complaint  Patient presents with  . Near Syncope   (Consider location/radiation/quality/duration/timing/severity/associated sxs/prior Treatment) The history is provided by the patient, the spouse and the EMS personnel.   72 year old male brought in by EMS. Patient around 11:00 this morning was riding lawn tractor started to feel bad 20 was passed out patient started to gray that he got very nauseated had the several dry heaves. Associated with shortness of breath diaphoresis no chest pain. Patient did have some chest pain yesterday. Patient has a history of coronary artery disease and is followed by Dr. Tresa Endo from cardiology. Currently starting to feel better but still feels fatigue. Patient did not pass out.  Past Medical History  Diagnosis Date  . Coronary artery disease   . Hypertension   . Hyperlipidemia   . Diabetes    Past Surgical History  Procedure Laterality Date  . Coronary angioplasty with stent placement    . Coronary artery bypass graft     No family history on file. History  Substance Use Topics  . Smoking status: Not on file  . Smokeless tobacco: Not on file  . Alcohol Use: No    Review of Systems  Constitutional: Positive for diaphoresis and fatigue. Negative for fever.  HENT: Negative for congestion.   Eyes: Positive for visual disturbance.  Respiratory: Positive for shortness of breath. Negative for chest tightness.   Cardiovascular: Negative for chest pain.  Gastrointestinal: Positive for nausea and vomiting. Negative for abdominal pain and diarrhea.  Genitourinary: Negative for dysuria.  Musculoskeletal: Negative for back pain.  Skin: Negative for rash.  Neurological: Negative for speech difficulty, weakness and headaches.  Hematological: Does not bruise/bleed easily.  Psychiatric/Behavioral: Negative for confusion.     Allergies  Ivp dye  Home Medications   Current Outpatient Rx  Name  Route  Sig  Dispense  Refill  . amLODipine (NORVASC) 10 MG tablet   Oral   Take 10 mg by mouth daily.         Marland Kitchen aspirin EC 81 MG tablet   Oral   Take 81 mg by mouth daily.         . calcium carbonate (TUMS - DOSED IN MG ELEMENTAL CALCIUM) 500 MG chewable tablet   Oral   Chew 2 tablets by mouth daily as needed for heartburn.         . carvedilol (COREG) 25 MG tablet   Oral   Take 25 mg by mouth 2 (two) times daily with a meal.         . clopidogrel (PLAVIX) 75 MG tablet   Oral   Take 75 mg by mouth daily.         Marland Kitchen ezetimibe-simvastatin (VYTORIN) 10-20 MG per tablet   Oral   Take 1 tablet by mouth at bedtime.         Marland Kitchen losartan (COZAAR) 50 MG tablet   Oral   Take 50 mg by mouth daily.         . metFORMIN (GLUCOPHAGE) 500 MG tablet   Oral   Take 500 mg by mouth 2 (two) times daily with a meal.         . nitroGLYCERIN (NITROSTAT) 0.4 MG SL tablet   Sublingual   Place 0.4 mg under the tongue every 5 (five) minutes as needed for chest pain.  BP 134/53  Pulse 76  Temp(Src) 97.7 F (36.5 C) (Oral)  Resp 18  SpO2 97% Physical Exam  Nursing note and vitals reviewed. Constitutional: He is oriented to person, place, and time. He appears well-developed and well-nourished. No distress.  HENT:  Head: Normocephalic and atraumatic.  Mouth/Throat: Oropharynx is clear and moist.  Eyes: Conjunctivae and EOM are normal. Pupils are equal, round, and reactive to light.  Neck: Normal range of motion. Neck supple.  Cardiovascular: Normal rate, regular rhythm and normal heart sounds.   No murmur heard. Pulmonary/Chest: Effort normal and breath sounds normal. No respiratory distress.  Abdominal: Soft. Bowel sounds are normal. There is no tenderness.  Neurological: He is alert and oriented to person, place, and time. No cranial nerve deficit. He exhibits normal muscle tone.  Coordination normal.  Skin: Skin is warm. No rash noted.    ED Course  Procedures (including critical care time) Labs Review Labs Reviewed  CBC - Abnormal; Notable for the following:    WBC 11.1 (*)    RBC 4.11 (*)    HCT 37.3 (*)    MCHC 36.5 (*)    All other components within normal limits  COMPREHENSIVE METABOLIC PANEL - Abnormal; Notable for the following:    Glucose, Bld 197 (*)    BUN 25 (*)    GFR calc non Af Amer 63 (*)    GFR calc Af Amer 73 (*)    All other components within normal limits  URINALYSIS, ROUTINE W REFLEX MICROSCOPIC - Abnormal; Notable for the following:    Color, Urine ORANGE (*)    APPearance HAZY (*)    Glucose, UA 100 (*)    Bilirubin Urine SMALL (*)    Ketones, ur 15 (*)    Protein, ur 100 (*)    Leukocytes, UA SMALL (*)    All other components within normal limits  URINE MICROSCOPIC-ADD ON - Abnormal; Notable for the following:    Casts HYALINE CASTS (*)    All other components within normal limits  POCT I-STAT TROPONIN I  POCT I-STAT TROPONIN I    Results for orders placed during the hospital encounter of 10/08/12  CBC      Result Value Range   WBC 11.1 (*) 4.0 - 10.5 K/uL   RBC 4.11 (*) 4.22 - 5.81 MIL/uL   Hemoglobin 13.6  13.0 - 17.0 g/dL   HCT 16.1 (*) 09.6 - 04.5 %   MCV 90.8  78.0 - 100.0 fL   MCH 33.1  26.0 - 34.0 pg   MCHC 36.5 (*) 30.0 - 36.0 g/dL   RDW 40.9  81.1 - 91.4 %   Platelets 260  150 - 400 K/uL  COMPREHENSIVE METABOLIC PANEL      Result Value Range   Sodium 136  135 - 145 mEq/L   Potassium 4.3  3.5 - 5.1 mEq/L   Chloride 100  96 - 112 mEq/L   CO2 21  19 - 32 mEq/L   Glucose, Bld 197 (*) 70 - 99 mg/dL   BUN 25 (*) 6 - 23 mg/dL   Creatinine, Ser 7.82  0.50 - 1.35 mg/dL   Calcium 9.7  8.4 - 95.6 mg/dL   Total Protein 7.2  6.0 - 8.3 g/dL   Albumin 3.8  3.5 - 5.2 g/dL   AST 19  0 - 37 U/L   ALT 15  0 - 53 U/L   Alkaline Phosphatase 80  39 - 117 U/L   Total Bilirubin  0.5  0.3 - 1.2 mg/dL   GFR calc non Af Amer  63 (*) >90 mL/min   GFR calc Af Amer 73 (*) >90 mL/min  URINALYSIS, ROUTINE W REFLEX MICROSCOPIC      Result Value Range   Color, Urine ORANGE (*) YELLOW   APPearance HAZY (*) CLEAR   Specific Gravity, Urine 1.026  1.005 - 1.030   pH 5.0  5.0 - 8.0   Glucose, UA 100 (*) NEGATIVE mg/dL   Hgb urine dipstick NEGATIVE  NEGATIVE   Bilirubin Urine SMALL (*) NEGATIVE   Ketones, ur 15 (*) NEGATIVE mg/dL   Protein, ur 191 (*) NEGATIVE mg/dL   Urobilinogen, UA 1.0  0.0 - 1.0 mg/dL   Nitrite NEGATIVE  NEGATIVE   Leukocytes, UA SMALL (*) NEGATIVE  URINE MICROSCOPIC-ADD ON      Result Value Range   Squamous Epithelial / LPF RARE  RARE   WBC, UA 0-2  <3 WBC/hpf   Bacteria, UA RARE  RARE   Casts HYALINE CASTS (*) NEGATIVE   Urine-Other MUCOUS PRESENT    POCT I-STAT TROPONIN I      Result Value Range   Troponin i, poc 0.01  0.00 - 0.08 ng/mL   Comment 3           POCT I-STAT TROPONIN I      Result Value Range   Troponin i, poc 0.02  0.00 - 0.08 ng/mL   Comment 3             Date: 10/08/2012  Rate: 64  Rhythm: normal sinus rhythm  QRS Axis: normal  Intervals: normal  ST/T Wave abnormalities: nonspecific T wave changes  Conduction Disutrbances:none  Narrative Interpretation:   Old EKG Reviewed: none available     Imaging Review Dg Chest 2 View  10/08/2012   CLINICAL DATA:  Chest pain  EXAM: CHEST  2 VIEW  COMPARISON:  02/12/2009.  FINDINGS: Mild hyperinflation of the lungs. Prior CABG. Heart and mediastinal contours are within normal limits. No focal opacities or effusions. No acute bony abnormality.  IMPRESSION: No active cardiopulmonary disease.   Electronically Signed   By: Charlett Nose   On: 10/08/2012 14:55   Ct Head Wo Contrast  10/08/2012   *RADIOLOGY REPORT*  Clinical Data: Near-syncope  CT HEAD WITHOUT CONTRAST  Technique:  Contiguous axial images were obtained from the base of the skull through the vertex without contrast.  Comparison: 05/06/2007  Findings: No evidence of  parenchymal hemorrhage or extra-axial fluid collection. No mass lesion, mass effect, or midline shift.  No CT evidence of acute infarction.  Subcortical white matter and periventricular small vessel ischemic changes.  Intracranial atherosclerosis.  Mild global cortical atrophy, likely age appropriate.  No ventriculomegaly.  The visualized paranasal sinuses are essentially clear. The mastoid air cells are unopacified.  No evidence of calvarial fracture.  IMPRESSION: No evidence of acute intracranial abnormality.  Mild atrophy with small vessel ischemic changes and intracranial atherosclerosis.   Original Report Authenticated By: Charline Bills, M.D.    MDM   1. Near syncope    Patient discharge feels fine earlier today around 44 had a near syncopal episode no chest pain associated with it extensive lab workup here negative including troponins x2 negative EKG without any acute changes. Patient now feels fine chest x-ray negative head CT negative lab workup negative for urinalysis no renal dysfunction mild leukocytosis no anemia no electrolyte abnormalities. Patient has followup with primary care Dr. and can be discharged home.  Patient to return for any new or worse symptoms. Patient also followup with cardiology.    Shelda Jakes, MD 10/08/12 [id number]

## 2012-10-08 NOTE — ED Notes (Signed)
Checked orthostatic VS which went well, pt tolerated this without feeling nauseated or unwell.  Gave pt a urinal and notified him that urine sample is needed

## 2012-10-08 NOTE — ED Notes (Signed)
Pt is here for nausea which came on after exertion.  Pt has had frequent episodes of angina after exertion which is sometimes accompanied by nausea.  Today this nausea was not associated with any CP.  Pt has been alert and oriented.

## 2012-10-11 ENCOUNTER — Ambulatory Visit (INDEPENDENT_AMBULATORY_CARE_PROVIDER_SITE_OTHER): Payer: Medicare Other | Admitting: Cardiovascular Disease

## 2012-10-11 ENCOUNTER — Encounter: Payer: Self-pay | Admitting: Cardiovascular Disease

## 2012-10-11 VITALS — BP 170/80 | HR 72 | Ht 71.0 in | Wt 219.4 lb

## 2012-10-11 DIAGNOSIS — G4730 Sleep apnea, unspecified: Secondary | ICD-10-CM

## 2012-10-11 DIAGNOSIS — I2510 Atherosclerotic heart disease of native coronary artery without angina pectoris: Secondary | ICD-10-CM

## 2012-10-11 DIAGNOSIS — I10 Essential (primary) hypertension: Secondary | ICD-10-CM

## 2012-10-11 DIAGNOSIS — E785 Hyperlipidemia, unspecified: Secondary | ICD-10-CM

## 2012-10-11 NOTE — Patient Instructions (Signed)
Your physician recommends that you schedule a follow-up appointment in: 6 MONTHS. NO changes made today.

## 2012-10-11 NOTE — Progress Notes (Signed)
Patient ID: [name], male   DOB: [birth year], 72 y.o.   MRN: [id number]     HPI: [name], is a 72 y.o. male who presents to the office in followup of comparison emergency room evaluation results to have possible dehydration.  [name] is now 72 years old. He has established coronary artery disease dating back to 1997 when he underwent initial CABG revascularization surgery. In 2006 he underwent stenting of the midportion of the graft was right coronary artery and cutting balloon to the LAD after the lead insertion. On New Year's Eve 2011 he developed an acute coronary syndrome was found to have a new subtotal stenosis in the graft to right coronary artery more distal to the prior stent and also had in-stent narrowing to the proximal portion of the previously placed stent. He underwent successful intervention with a 3.5x18 mm stent. His LIMA to the LAD was patent. The graft to the circumflex was occluded. He had 90% stenosis in a small marginal vessel. He does have a history of hypertension, type 2 diabetes mellitus, as well as hyperlipidemia. His last nuclear perfusion study was in February 2014 which continued to show normal perfusion without scar or ischemia. An echo Doppler study at that time showed an ejection fraction of 55-60%. Mild aortic sclerosis without stenosis. He had mild pulmonary hypertension with a PA pressure 32 mm.  Apparently 2 days ago, he developed a presyncopal spell after he'd been dragging and unloading logs from a stroller. He apparently was evaluated in the emergency room. Head CT did not show acute abnormalities although did suggest small vessel disease. Chest x-ray was normal. Cardiac enzymes were negative. Hemoglobin was 13.6 hematocrit 37.3. Renal function revealed a BUN of 25 currently 1.13. He was advised to see me for followup evaluation.  Past Medical History  Diagnosis Date  . Coronary artery disease   . Hypertension   . Hyperlipidemia   . Diabetes      Past Surgical History  Procedure Laterality Date  . Coronary angioplasty with stent placement    . Coronary artery bypass graft      Allergies  Allergen Reactions  . Ivp Dye [Iodinated Diagnostic Agents] Other (See Comments)    Increased blood pressure, heart rate    Current Outpatient Prescriptions  Medication Sig Dispense Refill  . amLODipine (NORVASC) 10 MG tablet Take 10 mg by mouth daily.      Marland Kitchen aspirin EC 81 MG tablet Take 81 mg by mouth daily.      . calcium carbonate (TUMS - DOSED IN MG ELEMENTAL CALCIUM) 500 MG chewable tablet Chew 2 tablets by mouth daily as needed for heartburn.      . carvedilol (COREG) 25 MG tablet Take 25 mg by mouth 2 (two) times daily with a meal.      . clopidogrel (PLAVIX) 75 MG tablet Take 75 mg by mouth daily.      Marland Kitchen ezetimibe-simvastatin (VYTORIN) 10-20 MG per tablet Take 1 tablet by mouth at bedtime.      Marland Kitchen losartan (COZAAR) 50 MG tablet Take 50 mg by mouth daily.      . metFORMIN (GLUCOPHAGE) 500 MG tablet Take 500 mg by mouth 2 (two) times daily with a meal.      . nitroGLYCERIN (NITROSTAT) 0.4 MG SL tablet Place 0.4 mg under the tongue every 5 (five) minutes as needed for chest pain.      . ONE TOUCH ULTRA TEST test strip  No current facility-administered medications for this visit.    History   Social History  . Marital Status: Married    Spouse Name: N/A    Number of Children: N/A  . Years of Education: N/A   Occupational History  . Not on file.   Social History Main Topics  . Smoking status: Never Smoker   . Smokeless tobacco: Never Used  . Alcohol Use: Yes     Comment: 2-3 beers a month.  . Drug Use: Not on file  . Sexual Activity: Not on file   Other Topics Concern  . Not on file   Social History Narrative  . No narrative on file    Family History  Problem Relation Age of Onset  . Heart attack Brother     ROS is negative for fevers, chills or night sweats. He denies any further episodes of  dizziness. He denies chest pain. He denies wheezing. He does have sleep apnea. He denies palpitations. He denies bleeding. He denies myalgias.  Other system review is negative.  PE BP 170/80  Pulse 72  Ht 5\' 11"  (1.803 m)  Wt 219 lb 6.4 oz (99.519 kg)  BMI 30.61 kg/m2  Repeat blood pressure by me was 1 3270 supine and his blood pressure actually increased to 140/74 standing. There is no significant orthostatic pulse rise. General: Alert, oriented, no distress.  Skin: normal turgor, no rashes HEENT: Normocephalic, atraumatic. Pupils round and reactive; sclera anicteric;no lid lag.  Nose without nasal septal hypertrophy Mouth/Parynx benign; Mallinpatti scale 3 Neck: No JVD, no carotid briuts Lungs: clear to ausculatation and percussion; no wheezing or rales Heart: RRR, s1 s2 normal 1-2/6 systolic murmur. No S3 to Abdomen: Moderate central adiposity soft, nontender; no hepatosplenomehaly, BS+; abdominal aorta nontender and not dilated by palpation. Pulses 2+ Extremities: no clubbing cyanosis or edema, Homan's sign negative  Neurologic: grossly nonfocal  ECG: Sinus rhythm at 72 beats per minute with previously noted the inferolateral T-wave abnormalities. Probable LVH with repolarization changes; incomplete right bundle branch block  LABS:  BMET    Component Value Date/Time   NA 136 10/08/2012 1314   K 4.3 10/08/2012 1314   CL 100 10/08/2012 1314   CO2 21 10/08/2012 1314   GLUCOSE 197* 10/08/2012 1314   BUN 25* 10/08/2012 1314   CREATININE 1.13 10/08/2012 1314   CALCIUM 9.7 10/08/2012 1314   GFRNONAA 63* 10/08/2012 1314   GFRAA 73* 10/08/2012 1314     Hepatic Function Panel     Component Value Date/Time   PROT 7.2 10/08/2012 1314   ALBUMIN 3.8 10/08/2012 1314   AST 19 10/08/2012 1314   ALT 15 10/08/2012 1314   ALKPHOS 80 10/08/2012 1314   BILITOT 0.5 10/08/2012 1314     CBC    Component Value Date/Time   WBC 11.1* 10/08/2012 1314   RBC 4.11* 10/08/2012 1314   HGB 13.6 10/08/2012  1314   HCT 37.3* 10/08/2012 1314   PLT 260 10/08/2012 1314   MCV 90.8 10/08/2012 1314   MCH 33.1 10/08/2012 1314   MCHC 36.5* 10/08/2012 1314   RDW 12.8 10/08/2012 1314   LYMPHSABS 2.6 02/12/2009 1520   MONOABS 0.8 02/12/2009 1520   EOSABS 0.2 02/12/2009 1520   BASOSABS 0.0 02/12/2009 1520     BNP    Component Value Date/Time   PROBNP <30.0 02/12/2009 1937    Lipid Panel     Component Value Date/Time   CHOL  Value: 133  ATP III CLASSIFICATION:  <200     mg/dL   Desirable  161-096  mg/dL   Borderline High  >=045    mg/dL   High        4/0/9811 0146   TRIG 152* 02/13/2009 0146   HDL 35* 02/13/2009 0146   CHOLHDL 3.8 02/13/2009 0146   VLDL 30 02/13/2009 0146   LDLCALC  Value: 68        Total Cholesterol/HDL:CHD Risk Coronary Heart Disease Risk Table                     Men   Women  1/2 Average Risk   3.4   3.3  Average Risk       5.0   4.4  2 X Average Risk   9.6   7.1  3 X Average Risk  23.4   11.0        Use the calculated Patient Ratio above and the CHD Risk Table to determine the patient's CHD Risk.        ATP III CLASSIFICATION (LDL):  <100     mg/dL   Optimal  914-782  mg/dL   Near or Above                    Optimal  130-159  mg/dL   Borderline  956-213  mg/dL   High  >086     mg/dL   Very High 06/20/8467 6295     RADIOLOGY: Dg Chest 2 View  10/08/2012   CLINICAL DATA:  Chest pain  EXAM: CHEST  2 VIEW  COMPARISON:  02/12/2009.  FINDINGS: Mild hyperinflation of the lungs. Prior CABG. Heart and mediastinal contours are within normal limits. No focal opacities or effusions. No acute bony abnormality.  IMPRESSION: No active cardiopulmonary disease.   Electronically Signed   By: Charlett Nose   On: 10/08/2012 14:55   Ct Head Wo Contrast  10/08/2012   *RADIOLOGY REPORT*  Clinical Data: Near-syncope  CT HEAD WITHOUT CONTRAST  Technique:  Contiguous axial images were obtained from the base of the skull through the vertex without contrast.  Comparison: 05/06/2007  Findings: No evidence of  parenchymal hemorrhage or extra-axial fluid collection. No mass lesion, mass effect, or midline shift.  No CT evidence of acute infarction.  Subcortical white matter and periventricular small vessel ischemic changes.  Intracranial atherosclerosis.  Mild global cortical atrophy, likely age appropriate.  No ventriculomegaly.  The visualized paranasal sinuses are essentially clear. The mastoid air cells are unopacified.  No evidence of calvarial fracture.  IMPRESSION: No evidence of acute intracranial abnormality.  Mild atrophy with small vessel ischemic changes and intracranial atherosclerosis.   Original Report Authenticated By: Charline Bills, M.D.      ASSESSMENT AND PLAN: I did review [name] hospitalization records from his recent emergency room evaluation. [name] is status post CABG surgery in 1997 and has undergone several interventions in 2006 his last being in 2011 as noted above. His last nuclear perfusion study done in February 2014 continues to show normal perfusion. Presently, he is not orthostatic. His blood pressure is well-controlled. He is not tachycardic or bradycardic. He does have diffuse T-wave changes but these are old and has been present on prior ECGs. Clinically he seems stable. I suspect he may have had some mild dehydration with significant straining possibly vagal event while he was dragging heavy logs. I did review his laboratory. I recommended he can't continue his current therapy.  He is not having anginal symptoms. I will see him in 6 months for followup evaluation.     [name] Bihari, MD, [facility]  10/11/2012 2:39 PM

## 2012-11-20 ENCOUNTER — Other Ambulatory Visit: Payer: Self-pay | Admitting: Cardiovascular Disease

## 2012-11-20 NOTE — Telephone Encounter (Signed)
Rx was sent to pharmacy electronically. 

## 2012-12-06 ENCOUNTER — Other Ambulatory Visit: Payer: Self-pay | Admitting: Cardiovascular Disease

## 2012-12-06 NOTE — Telephone Encounter (Signed)
Rx was sent to pharmacy electronically. 

## 2012-12-19 ENCOUNTER — Other Ambulatory Visit: Payer: Self-pay | Admitting: Family Medicine

## 2012-12-19 ENCOUNTER — Ambulatory Visit (INDEPENDENT_AMBULATORY_CARE_PROVIDER_SITE_OTHER): Payer: Medicare Other | Admitting: Family Medicine

## 2012-12-19 ENCOUNTER — Ambulatory Visit: Payer: Medicare Other

## 2012-12-19 VITALS — BP 130/60 | HR 70 | Temp 97.9°F | Resp 18 | Ht 70.0 in | Wt 214.0 lb

## 2012-12-19 DIAGNOSIS — R630 Anorexia: Secondary | ICD-10-CM

## 2012-12-19 DIAGNOSIS — M25559 Pain in unspecified hip: Secondary | ICD-10-CM

## 2012-12-19 DIAGNOSIS — M25551 Pain in right hip: Secondary | ICD-10-CM

## 2012-12-19 DIAGNOSIS — M791 Myalgia, unspecified site: Secondary | ICD-10-CM

## 2012-12-19 DIAGNOSIS — R3915 Urgency of urination: Secondary | ICD-10-CM

## 2012-12-19 DIAGNOSIS — R5381 Other malaise: Secondary | ICD-10-CM

## 2012-12-19 DIAGNOSIS — D649 Anemia, unspecified: Secondary | ICD-10-CM

## 2012-12-19 LAB — POCT CBC
Granulocyte percent: 64.4 %G (ref 37–80)
HCT, POC: 34.1 % — AB (ref 43.5–53.7)
Lymph, poc: 2.9 (ref 0.6–3.4)
MCHC: 30.8 g/dL — AB (ref 31.8–35.4)
MCV: 98.6 fL — AB (ref 80–97)
MID (cbc): 0.9 (ref 0–0.9)
POC Granulocyte: 6.8 (ref 2–6.9)
POC LYMPH PERCENT: 27.5 %L (ref 10–50)
Platelet Count, POC: 352 10*3/uL (ref 142–424)
RDW, POC: 12.7 %

## 2012-12-19 LAB — POCT UA - MICROSCOPIC ONLY
Casts, Ur, LPF, POC: NEGATIVE
Epithelial cells, urine per micros: NEGATIVE
Yeast, UA: NEGATIVE

## 2012-12-19 LAB — POCT URINALYSIS DIPSTICK
Ketones, UA: NEGATIVE
Protein, UA: 100
Spec Grav, UA: 1.025
Urobilinogen, UA: 1
pH, UA: 5.5

## 2012-12-19 NOTE — Progress Notes (Addendum)
[address]   [place], [place]  [address]   [phone] Subjective:    Patient ID: [name], male    DOB: 1940-06-07, 72 y.o.   MRN: [id number]  This chart was scribed for Ethelda Chick, MD by Blanchard Kelch, ED Scribe. The patient was seen in room 4. Patient's care was started at 8:37 PM.  Chief Complaint  Patient presents with   Anorexia    has not been eating well   sharp pains    radiating to random parts of the body   Dysuria    had UA at PCP's office Monday- test was negative   Gait Problem    unstable when walking- has almost fallen several times in the past couple weeks     HPI  [name] is a 72 y.o. male who presents to office complaining of decreased appetite for over a week. He usually has a large appetite but is not feeling hungry recently. All he ate today was three small pancakes. His wife made him his favorite fried cinnamon apples and he turned them down due to lack of appetite. However, he denies nausea or abdominal pain.   He also almost fell two or three times due to an unsteady gait a week ago. He is having constant myalgias throughout his body for the same amount of time, worst in his hips bilaterally. He has had intermittent sharp pains throughout his body, but today it occurred though his testicles. He states the sharp pain feels like a bee sting. He has had worsening above baseline back pain for the past few weeks according to his wife. His family states that his is not one to complain and will let problems go for awhile until he mentions or complains about them.  He feels a lot of pressure when he feels the need to urinate if he doesn't go as soon as he feels the sensation. It is worsened when he gets up, and he is unable to control it, however, he has not had urinary incontinence yet. He has been waking up every night a few times to urinate. He was able to sleep through the night a few months ago. He had one episode of dysuria but it has  subsided. His UA on 12/16/12 was normal according to his PCP, Dr. Donette Larry. His son reports that his father had had about 100 kidney stones in the past.   All of the symptoms worsened to the point of him complaining about two weeks ago. He is most concerned about not being able to move his body well because it feels stiff and his hips feel locked. He has also had weakness in the legs. These are all drastic changes and have not been worsening gradually over time.   He had an appointment with his PCP, Dr. Donette Larry, on 12/16/12, who diagnosed the problem as arthritis and told him to take Tylenol, but his family does not believe this is the issue. The Tylenol has not been helping with the symptoms. Dr. Donette Larry did not check his prostate or order any x-rays. He ran blood work, which came back normal.  He has a cystic lesion on his right ear that has grown recently. It has been seen by prior doctors who diagnosed it as excess fatty tissue. His father died of cancer that began behind the ear according to his wife.  He is diagnosed with sleep apnea but does not use his breathing machine according to his son. He  had one episode a few months ago where he was sleep talking but slightly remembers the episode.   He denies experiencing chest pain today, but has experienced it intermittently prior to coming in. He believes that it is due to his past medical history of angina. He denies flank pain, abdominal pain, constipation, diarrhea, melena, nausea, dizziness or shortness of breath.    Past Medical History  Diagnosis Date   Coronary artery disease    Hypertension    Hyperlipidemia    Diabetes    Heart murmur    Chronic kidney disease    Myocardial infarction    Past Surgical History  Procedure Laterality Date   Coronary angioplasty with stent placement     Coronary artery bypass graft     Joint replacement     Family History  Problem Relation Age of Onset   Heart attack Brother    Heart  disease Brother    Diabetes Mother    Heart disease Mother    Hypertension Mother    Cancer Father    Diabetes Sister    Hypertension Sister    Heart disease Brother    Diabetes Brother    History   Social History   Marital Status: Married    Spouse Name: N/A    Number of Children: N/A   Years of Education: N/A   Occupational History   Not on file.   Social History Main Topics   Smoking status: Never Smoker    Smokeless tobacco: Never Used   Alcohol Use: Yes     Comment: 2-3 beers a month.   Drug Use: Not on file   Sexual Activity: Not on file   Other Topics Concern   Not on file   Social History Narrative   No narrative on file   Allergies  Allergen Reactions   Ivp Dye [Iodinated Diagnostic Agents] Other (See Comments)    Increased blood pressure, heart rate   Current Outpatient Prescriptions on File Prior to Visit  Medication Sig Dispense Refill   amLODipine (NORVASC) 10 MG tablet Take 10 mg by mouth daily.       aspirin EC 81 MG tablet Take 81 mg by mouth daily.       calcium carbonate (TUMS - DOSED IN MG ELEMENTAL CALCIUM) 500 MG chewable tablet Chew 2 tablets by mouth daily as needed for heartburn.       carvedilol (COREG) 25 MG tablet TAKE 1 TABLET BY MOUTH TWICE A DAY  60 tablet  6   clopidogrel (PLAVIX) 75 MG tablet TAKE 1 TABLET EVERY DAY  30 tablet  10   ezetimibe-simvastatin (VYTORIN) 10-20 MG per tablet Take 1 tablet by mouth at bedtime.       losartan (COZAAR) 50 MG tablet Take 50 mg by mouth daily.       metFORMIN (GLUCOPHAGE) 500 MG tablet Take 500 mg by mouth 2 (two) times daily with a meal.       nitroGLYCERIN (NITROSTAT) 0.4 MG SL tablet Place 0.4 mg under the tongue every 5 (five) minutes as needed for chest pain.       ONE TOUCH ULTRA TEST test strip        No current facility-administered medications on file prior to visit.     Review of Systems  Constitutional: Positive for appetite change (decreased).    Respiratory: Negative for shortness of breath.   Cardiovascular: Negative for chest pain.  Gastrointestinal: Negative for nausea, abdominal pain, diarrhea, constipation and blood  in stool.  Genitourinary: Positive for dysuria, urgency and flank pain.  Musculoskeletal: Positive for arthralgias, back pain, gait problem (slower than usual) and myalgias. Negative for neck pain.  Neurological: Positive for weakness. Negative for dizziness.       Objective:   Physical Exam  Nursing note and vitals reviewed. Constitutional: He is oriented to person, place, and time. He appears well-developed and well-nourished. No distress.  HENT:  Head: Normocephalic and atraumatic.  Right Ear: Tympanic membrane, external ear and ear canal normal.  Left Ear: Tympanic membrane, external ear and ear canal normal.  Mouth/Throat: Oropharynx is clear and moist.  Eyes: Conjunctivae and EOM are normal. Pupils are equal, round, and reactive to light.  Neck: Normal range of motion. Neck supple. No JVD present. No tracheal deviation present.  Cardiovascular: Normal rate, regular rhythm and intact distal pulses.   No murmur heard. Pulmonary/Chest: Effort normal and breath sounds normal. No respiratory distress. He has no wheezes. He has no rales.  Abdominal: Soft. Bowel sounds are normal. He exhibits no distension. There is no tenderness. There is no rebound.  Genitourinary: Prostate normal.  Testis normal. No hernias bilateral.  Musculoskeletal: Normal range of motion.  Full ROM of cervical and lumbar spine without pain. Non tender to palpation in cervical and lumbar spines. Gait is slowed and wide based.   Lymphadenopathy:    He has no cervical adenopathy.  Neurological: He is alert and oriented to person, place, and time.  Skin: Skin is warm and dry.  Psychiatric: He has a normal mood and affect. His behavior is normal.    Results for orders placed in visit on 12/19/12  POCT CBC      Result Value Range    WBC 10.6 (*) 4.6 - 10.2 K/uL   Lymph, poc 2.9  0.6 - 3.4   POC LYMPH PERCENT 27.5  10 - 50 %L   MID (cbc) 0.9  0 - 0.9   POC MID % 8.1  0 - 12 %M   POC Granulocyte 6.8  2 - 6.9   Granulocyte percent 64.4  37 - 80 %G   RBC 3.46 (*) 4.69 - 6.13 M/uL   Hemoglobin 10.5 (*) 14.1 - 18.1 g/dL   HCT, POC 16.1 (*) 09.6 - 53.7 %   MCV 98.6 (*) 80 - 97 fL   MCH, POC 30.3  27 - 31.2 pg   MCHC 30.8 (*) 31.8 - 35.4 g/dL   RDW, POC 04.5     Platelet Count, POC 352  142 - 424 K/uL   MPV 8.1  0 - 99.8 fL  POCT URINALYSIS DIPSTICK      Result Value Range   Color, UA yellow     Clarity, UA clear     Glucose, UA neg     Bilirubin, UA small     Ketones, UA neg     Spec Grav, UA 1.025     Blood, UA neg     pH, UA 5.5     Protein, UA 100     Urobilinogen, UA 1.0     Nitrite, UA neg     Leukocytes, UA Negative    POCT UA - MICROSCOPIC ONLY      Result Value Range   WBC, Ur, HPF, POC 0-3     RBC, urine, microscopic 0-1     Bacteria, U Microscopic neg     Mucus, UA small     Epithelial cells, urine per micros neg  Crystals, Ur, HPF, POC neg     Casts, Ur, LPF, POC neg     Yeast, UA neg      [facility] reading (PRIMARY) by  Dr. Katrinka Blazing.  R HIP: NAD.      Assessment & Plan:   Hip pain, right - Plan: DG Hip Complete Right  Muscle pain - Plan: POCT CBC, POCT urinalysis dipstick, POCT UA - Microscopic Only, Urine culture, PSA, Comprehensive metabolic panel, Sedimentation Rate, CK  Loss of appetite - Plan: POCT CBC, POCT urinalysis dipstick, POCT UA - Microscopic Only, Urine culture, PSA, Comprehensive metabolic panel, Sedimentation Rate, CK  Other malaise and fatigue - Plan: POCT CBC, POCT urinalysis dipstick, POCT UA - Microscopic Only, Urine culture, PSA, Comprehensive metabolic panel, Sedimentation Rate, CK  Anemia  Urinary urgency  1.  R hip pain:  New.  Obtain ESR, CK.  Treat with Prednisone, Tramadol for pain. 2.  Myalgias and arthralgias:  New and diffuse and severe; obtain ESR, CK,  CMET, CBC to evaluate for autoimmune process. Treat with Prednisone, Tramadol.  Close follow-up; may warrant evaluation by rheumatology if symptoms persists. 3.  Loss of appetite:  New.  Obtain labs.  Restart PPI daily.  Recent worsening GERD symptoms as well. 4.  Anemia:  New.  Repeat labs in 1-2 weeks.  Obtain hemosure. 5.  Urinary urgency:  New.  Send urine culture; suggestive of underlying prostatitis due to acute onset.  Obtain PSA.  Rx for Cipro provided.  Meds ordered this encounter  Medications   DISCONTD: traMADol (ULTRAM) 50 MG tablet    Sig: Take by mouth every 6 (six) hours as needed.   DISCONTD: ciprofloxacin (CIPRO) 500 MG tablet    Sig: Take 1 tablet (500 mg total) by mouth 2 (two) times daily.    Dispense:  20 tablet    Refill:  0   DISCONTD: predniSONE (DELTASONE) 20 MG tablet    Sig: 2 tablets daily x 5 days then one tablet daily x 5 days    Dispense:  15 tablet    Refill:  0   I personally performed the services described in this documentation, which was scribed in my presence.  The recorded information has been reviewed and is accurate.  [name], M.D.  [facility] [address] [place], [place]  [address] [phone] phone [phone] fax

## 2012-12-20 LAB — COMPREHENSIVE METABOLIC PANEL
ALT: 13 U/L (ref 0–53)
AST: 16 U/L (ref 0–37)
Albumin: 3.5 g/dL (ref 3.5–5.2)
CO2: 24 mEq/L (ref 19–32)
Creat: 0.95 mg/dL (ref 0.50–1.35)
Potassium: 4.5 mEq/L (ref 3.5–5.3)
Sodium: 133 mEq/L — ABNORMAL LOW (ref 135–145)
Total Bilirubin: 0.5 mg/dL (ref 0.3–1.2)
Total Protein: 6.8 g/dL (ref 6.0–8.3)

## 2012-12-20 LAB — PSA: PSA: 2.82 ng/mL (ref <=4.00)

## 2012-12-21 LAB — URINE CULTURE: Colony Count: NO GROWTH

## 2012-12-23 ENCOUNTER — Telehealth: Payer: Self-pay

## 2012-12-23 LAB — SEDIMENTATION RATE

## 2012-12-23 NOTE — Telephone Encounter (Signed)
Patient was suppose to have a Sed Rate done 12/19/2012. There was not a Lavender tube sent for the Sed rate to be done. Do you want [place] to have the patient come back for lab only at no charge?

## 2012-12-23 NOTE — Telephone Encounter (Signed)
Patient calling about his test results please call [phone] or [phone]

## 2012-12-23 NOTE — Telephone Encounter (Signed)
Noted. Thanks. What can I advise him regarding his test results?

## 2012-12-23 NOTE — Telephone Encounter (Signed)
No; I will see him in follow-up in the upcoming 1-2 weeks; I will repeat ESR then.

## 2012-12-25 MED ORDER — CIPROFLOXACIN HCL 500 MG PO TABS: 500.0000 mg | ORAL_TABLET | Freq: Two times a day (BID) | ORAL | Status: DC

## 2012-12-25 MED ORDER — PREDNISONE 20 MG PO TABS: ORAL_TABLET | ORAL | Status: DC

## 2012-12-25 NOTE — Telephone Encounter (Signed)
I have sent my recommendations to the lab pool;  Please call patient with results if you review this message before the lab is able to contact him.  Thanks!

## 2012-12-25 NOTE — Telephone Encounter (Signed)
Patient made appointment with Dr Katrinka Blazing for a reck.

## 2012-12-25 NOTE — Telephone Encounter (Signed)
Scheduling --  Please schedule pt OV with me in upcoming 1-2 weeks; OK to use 12:00pm slot if no appointments available.

## 2012-12-26 ENCOUNTER — Ambulatory Visit: Payer: Medicare Other | Admitting: Physician Assistant

## 2012-12-30 ENCOUNTER — Ambulatory Visit (INDEPENDENT_AMBULATORY_CARE_PROVIDER_SITE_OTHER): Payer: Medicare Other | Admitting: Physician Assistant

## 2012-12-30 ENCOUNTER — Encounter: Payer: Self-pay | Admitting: Physician Assistant

## 2012-12-30 VITALS — BP 130/68 | HR 72 | Ht 70.0 in | Wt 212.0 lb

## 2012-12-30 DIAGNOSIS — R079 Chest pain, unspecified: Secondary | ICD-10-CM

## 2012-12-30 DIAGNOSIS — K2270 Barrett's esophagus without dysplasia: Secondary | ICD-10-CM

## 2012-12-30 DIAGNOSIS — K219 Gastro-esophageal reflux disease without esophagitis: Secondary | ICD-10-CM

## 2012-12-30 DIAGNOSIS — I25810 Atherosclerosis of coronary artery bypass graft(s) without angina pectoris: Secondary | ICD-10-CM

## 2012-12-30 DIAGNOSIS — I2510 Atherosclerotic heart disease of native coronary artery without angina pectoris: Secondary | ICD-10-CM

## 2012-12-30 MED ORDER — OMEPRAZOLE 40 MG PO CPDR: DELAYED_RELEASE_CAPSULE | ORAL | Status: DC

## 2012-12-30 NOTE — Progress Notes (Signed)
Subjective:    Patient ID: [name], male    DOB: 1940-03-16, 72 y.o.   MRN: [id number]  HPI  [name] is a pleasant 72 year old white male known to Dr. Arlyce Dice who has history of Barrett's esophagus. He last had EGD in July of 2010 was found to have duodenitis and Barrett's. Biopsies showed no evidence of dysplasia. He also had duodenitis. He is on chronic omeprazole 40 mg by mouth daily. His wife said he had stopped taking it for some time but has been back on it the past week or so. He comes in today with complaints of chest pain which she says has been present intermittently over the past several weeks. He has noticed that this seems to be worse after eating. But when asked to be specific he has no complaints of dysphagia or odynophagia or heartburn. No abdominal pain and no nausea. 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 he will typically get chest pain. He says frequently this pain is pretty bad and he has to sit down. He denies any diaphoresis shortness of breath or associated dizziness. His wife did give him nitroglycerin a couple of nights ago for one these episodes and it did help. Patient states that he had a sound wave test of his heart several months ago which she was told was okay so he figured that this was not his heart.   Review of Systems  HENT: Negative.   Eyes: Negative.   Respiratory: Positive for chest tightness.   Cardiovascular: Positive for chest pain.  Gastrointestinal: Negative.   Endocrine: Negative.   Genitourinary: Negative.   Musculoskeletal: Negative.   Allergic/Immunologic: Negative.   Neurological: Positive for weakness.  Hematological: Negative.   Psychiatric/Behavioral: Negative.    Outpatient Prescriptions Prior to Visit  Medication Sig Dispense Refill  . amLODipine (NORVASC) 10 MG tablet Take 10 mg by mouth daily.       Marland Kitchen aspirin EC 81 MG tablet Take 81 mg by mouth daily.      . calcium carbonate (TUMS - DOSED IN MG ELEMENTAL CALCIUM) 500 MG chewable tablet Chew 2 tablets by mouth daily as needed for heartburn.      . carvedilol (COREG) 25 MG tablet TAKE 1 TABLET BY MOUTH TWICE A DAY  60 tablet  6  . ciprofloxacin (CIPRO) 500 MG tablet Take 1 tablet (500 mg total) by mouth 2 (two) times daily.  20 tablet  0  . clopidogrel (PLAVIX) 75 MG tablet TAKE 1 TABLET EVERY DAY  30 tablet  10  . ezetimibe-simvastatin (VYTORIN) 10-20 MG per tablet Take 1 tablet by mouth at bedtime.      Marland Kitchen losartan (COZAAR) 50 MG tablet Take 50 mg by mouth daily.      . metFORMIN (GLUCOPHAGE) 500 MG tablet Take 500 mg by mouth 2 (two) times daily with a meal.      . nitroGLYCERIN (NITROSTAT) 0.4 MG SL tablet Place 0.4 mg under the tongue every 5 (five) minutes as needed for chest pain.      . ONE TOUCH ULTRA TEST test strip       . predniSONE (DELTASONE) 20 MG tablet 2 tablets daily x 5 days then one tablet daily x 5 days  15 tablet  0  . traMADol (ULTRAM) 50 MG tablet Take by mouth every 6 (  six) hours as needed.       No facility-administered medications prior to visit.   Allergies  Allergen Reactions  . Ivp Dye [Iodinated Diagnostic Agents] Other (See Comments)    Increased blood pressure, heart rate   Patient Active Problem List   Diagnosis Date Noted  . Hyperlipidemia LDL goal < 70 10/11/2012  . BARRETTS ESOPHAGUS 08/26/2008  . DIABETES MELLITUS, TYPE I 08/25/2008  . HYPERTENSION 08/25/2008  . CORONARY ARTERY DISEASE 08/25/2008  . SLEEP APNEA 08/25/2008   History   Social History Narrative  . No narrative on file      family history includes Cancer in his father; Diabetes in his brother, mother, and sister; Heart attack in his brother; Heart disease in his brother, brother, and mother; Hypertension in his mother and sister.  Objective:   Physical Exam  white male in no acute distress, pleasant blood pressure 130/68  pulse 72 height 5 foot 10 weight 212. HEENT; nontraumatic normocephalic EOMI PERRLA sclera anicteric, Supple; no JVD, Cardiovascular; regular rate and rhythm with S1-S2 there is a soft systolic murmur pulmonary clear bilaterally, Abdomen; soft nontender obese no palpable mass or hepatomegaly bowel sounds are active, Rectal; exam not done, Extremities ;no clubbing cyanosis or edema skin warm and dry, Psych; mood and affect normal and appropriate        Assessment & Plan:  #59  72 year old male with severe coronary artery disease status post remote CABG and subsequent stents on chronic Plavix with intermittent chest pain x2-3 weeks which is exertional and very concerning for angina. I doubt this is related to acid reflux. #2 Barrett's esophagus-no dysplasia on previous biopsies last EGD July 2010 #3 chronic antiplatelet therapy with Plavix and aspirin #4 sleep apnea #5 hypertension #6 diabetes mellitus  Plan; increase omeprazole to 40 mg by mouth twice daily We have scheduled patient for a cardiology appointment with Dr. Tresa Endo for later this week. Would not consider EGD until he has been cleared from a cardiac standpoint Antireflux regimen was reviewed as well.

## 2012-12-30 NOTE — Patient Instructions (Addendum)
We made you an appointment with Dr. Nicki Guadalajara for Friday 01-03-2013 at 9:45 am. We sent a prescription to [facility] [address] for twice daily Prilosec, Omeprazole.  Follow up with Dr. Arlyce Dice once you are cleared with Dr. Tresa Endo.

## 2012-12-31 NOTE — Progress Notes (Signed)
Reviewed and agree with management. Robert D. Kaplan, M.D., FACG  

## 2013-01-02 ENCOUNTER — Telehealth: Payer: Self-pay

## 2013-01-02 NOTE — Telephone Encounter (Signed)
Call wife --- 1. Please clarify WHAT is worse with Prednisone.  2.  Have patient STOP prednisone.

## 2013-01-02 NOTE — Telephone Encounter (Signed)
Pt's wife called.  Marion was called with test results that she says he didn't quite understand.  She would like you to call her back with some clarification.  [phone]

## 2013-01-02 NOTE — Telephone Encounter (Signed)
Notes Recorded by Ethelda Chick, MD on 12/25/2012 at 9:02 AM Call --- 1. PSA level normal at 2.82. 2. Sugar slightly elevated at 143. 3. Kidney function is normal. 4. Liver function is normal. 5. Muscle enzyme normal. 6. Urine culture is negative; no evidence of UTI. 7. Due to patient's current symptoms, I recommend the following: Treat for a mild prostate infection due to recent changes in urination. Treat with Prednisone to see if his joint pain will improve. I will send in two prescriptions to his pharmacy. Follow up in upcoming 1-2 weeks for repeat labs to follow-up on anemia. How is his appetite?  Called wife to advise. She will make sure he comes in for the repeat labs, he will come the first week of Dec. He is improving with the medication. He still has discomfort with his abdomen. He has some chest pains after eating, this has been since taking the prednisone. He is encouraged to take the prednisone with a meal in the morning and drink a lot of water. He is to have an endoscopy scheduled, he has Barretts esophagus. He does see the cardiologist tomorrow, for clearance for the endoscopy. After further discussion with is wife, she indicates the prednisone is making him worse, I told her I will discuss with you then call her back, do you think he may need to d/c the prednisone?

## 2013-01-03 ENCOUNTER — Ambulatory Visit (INDEPENDENT_AMBULATORY_CARE_PROVIDER_SITE_OTHER): Payer: Medicare Other | Admitting: Cardiovascular Disease

## 2013-01-03 ENCOUNTER — Telehealth: Payer: Self-pay | Admitting: Gastroenterology

## 2013-01-03 ENCOUNTER — Encounter: Payer: Self-pay | Admitting: Cardiovascular Disease

## 2013-01-03 VITALS — BP 180/90 | HR 82 | Ht 71.0 in | Wt 208.0 lb

## 2013-01-03 DIAGNOSIS — E785 Hyperlipidemia, unspecified: Secondary | ICD-10-CM

## 2013-01-03 DIAGNOSIS — G4730 Sleep apnea, unspecified: Secondary | ICD-10-CM

## 2013-01-03 DIAGNOSIS — I2510 Atherosclerotic heart disease of native coronary artery without angina pectoris: Secondary | ICD-10-CM

## 2013-01-03 DIAGNOSIS — K2270 Barrett's esophagus without dysplasia: Secondary | ICD-10-CM

## 2013-01-03 DIAGNOSIS — I10 Essential (primary) hypertension: Secondary | ICD-10-CM

## 2013-01-03 NOTE — Telephone Encounter (Signed)
Pts wife states that they were seen by Mike Gip PA the other day and set up to see Dr. Tresa Endo for cardiac clearance for an EGD. State they were seen by Dr. Tresa Endo today and "everything was fine." Calling to schedule an EGD. Per office note it states to follow-up with Dr. Arlyce Dice. Does the pt need another OV or just Direct EGD? Please advise.

## 2013-01-03 NOTE — Telephone Encounter (Signed)
I spoke to the wife, and patient yesterday. His abdominal pain/ reflux/ chest pain worse with prednisone. His Urinary frequency has resolved. Sorry this was confusing. I have called again, left message to call me back so I can make sure he knows to d/c the prednisone.

## 2013-01-03 NOTE — Patient Instructions (Signed)
Your physician recommends that you schedule a follow-up appointment in: 3 months  Your physician has recommended you make the following change in your medication: Increase Losartan to 50 mg twice a day and carvedilol to 1 1/2 tablets twice a day

## 2013-01-05 ENCOUNTER — Encounter: Payer: Self-pay | Admitting: Cardiovascular Disease

## 2013-01-05 MED ORDER — CARVEDILOL 25 MG PO TABS: 37.5000 mg | ORAL_TABLET | Freq: Two times a day (BID) | ORAL | Status: DC

## 2013-01-05 MED ORDER — LOSARTAN POTASSIUM 50 MG PO TABS: 50.0000 mg | ORAL_TABLET | Freq: Two times a day (BID) | ORAL | Status: DC

## 2013-01-05 NOTE — Progress Notes (Signed)
Patient ID: [name], male   DOB: Apr 12, 1940, 72 y.o.   MRN: [id number]      HPI: [name], is a 72 y.o. male who presents to the office for followup evaluation. He was recently seen by a PA in the gastroenterology division and the patient was told to follow up with cardiology before a planned endoscopy.  [name]  has established coronary artery disease dating back to 1997 when he underwent initial CABG revascularization surgery. In 2006 he underwent stenting of the midportion of the graft to the right coronary artery and cutting balloon to the LAD after the LIMA insertion. On New Year's Eve 2011 he developed an acute coronary syndrome was found to have a new subtotal stenosis in the graft to right coronary artery more distal to the prior stent and also had in-stent narrowing to the proximal portion of the previously placed stent. He underwent successful intervention with a 3.5x18 mm stent. His LIMA to the LAD was patent. The graft to the circumflex was occluded. He had 90% stenosis in a small marginal vessel. He does have a history of hypertension, type 2 diabetes mellitus, as well as hyperlipidemia. His last nuclear perfusion study was in February 2014 which continued to show normal perfusion without scar or ischemia. An echo Doppler study at that time showed an ejection fraction of 55-60%. Mild aortic sclerosis without stenosis. He had mild pulmonary hypertension with a PA pressure 32 mm.  In August 2014, he developed a presyncopal spell after he'd been dragging and unloading logs from a stroller. He apparently was evaluated in the emergency room. Head CT did not show acute abnormalities although did suggest small vessel disease. Chest x-ray was normal. Cardiac enzymes were negative. Hemoglobin was 13.6 hematocrit 37.3. Renal function revealed a BUN of 25 currently 1.13. I saw him in the office several days later and felt that his symptoms occurred in the setting of mild dehydration  with significant straining possibly inducing a vagal event while he was dragging heavy logs.   He does have a history of Barrett's esophagus. Recently, he has developed episodes of chest pain typically after he eats. This has been associated with a sensation of increased gas. He feels symptoms radiating from his stomach into his esophagus. Recently, he started taking over-the-counter probiotics and some of his abdominal bloating and gas has significantly improved. He denies recent chest pressure that is similar to his prior angina. He denies any change in shortness of breath development. He recently saw the PA for Dr[facility]. Because of this chest pain history it was recommended that he see me prior to planned endoscopy.  Past Medical History  Diagnosis Date  . Coronary artery disease   . Hypertension   . Hyperlipidemia   . Diabetes   . Heart murmur   . Chronic kidney disease   . Myocardial infarction   . GERD (gastroesophageal reflux disease)     Past Surgical History  Procedure Laterality Date  . Coronary angioplasty with stent placement    . Coronary artery bypass graft    . Joint replacement      Allergies  Allergen Reactions  . Ivp Dye [Iodinated Diagnostic Agents] Other (See Comments)    Increased blood pressure, heart rate    Current Outpatient Prescriptions  Medication Sig Dispense Refill  . amLODipine (NORVASC) 10 MG tablet Take 10 mg by mouth daily.      Marland Kitchen aspirin EC 81 MG tablet Take 81 mg  by mouth daily.      . calcium carbonate (TUMS - DOSED IN MG ELEMENTAL CALCIUM) 500 MG chewable tablet Chew 2 tablets by mouth daily as needed for heartburn.      . carvedilol (COREG) 25 MG tablet Take 1.5 tablets by mouth twice a day      . ciprofloxacin (CIPRO) 500 MG tablet Take 1 tablet (500 mg total) by mouth 2 (two) times daily.  20 tablet  0  . clopidogrel (PLAVIX) 75 MG tablet TAKE 1 TABLET EVERY DAY  30 tablet  10  . ezetimibe-simvastatin (VYTORIN) 10-20 MG per  tablet Take 1 tablet by mouth at bedtime.      Marland Kitchen glimepiride (AMARYL) 1 MG tablet Take 1 tablet by mouth daily.      Marland Kitchen losartan (COZAAR) 50 MG tablet Take 50 mg by mouth 2 (two) times daily.       . metFORMIN (GLUCOPHAGE) 500 MG tablet Take 500 mg by mouth 2 (two) times daily with a meal.      . nitroGLYCERIN (NITROSTAT) 0.4 MG SL tablet Place 0.4 mg under the tongue every 5 (five) minutes as needed for chest pain.      . ONE TOUCH ULTRA TEST test strip       . predniSONE (DELTASONE) 20 MG tablet 2 tablets daily x 5 days then one tablet daily x 5 days  15 tablet  0  . traMADol (ULTRAM) 50 MG tablet Take by mouth every 6 (six) hours as needed.      Marland Kitchen omeprazole (PRILOSEC) 40 MG capsule Take 1 tab twice daily.  60 capsule  6   No current facility-administered medications for this visit.    History   Social History  . Marital Status: Married    Spouse Name: N/A    Number of Children: N/A  . Years of Education: N/A   Occupational History  . Not on file.   Social History Main Topics  . Smoking status: Never Smoker   . Smokeless tobacco: Never Used  . Alcohol Use: Yes     Comment: 2-3 beers a month.  . Drug Use: No  . Sexual Activity: Not on file   Other Topics Concern  . Not on file   Social History Narrative  . No narrative on file   Social history notable that he is married he has one child. There is no tobacco use. He does not routinely exercise. He does drink alcohol infrequently.  Family History  Problem Relation Age of Onset  . Heart attack Brother   . Heart disease Brother   . Diabetes Mother   . Heart disease Mother   . Hypertension Mother   . Cancer Father   . Diabetes Sister   . Hypertension Sister   . Heart disease Brother   . Diabetes Brother     ROS is negative for fevers, chills or night sweats. He denies skin rash. He denies change in vision or hearing. He denies any further episodes of dizziness. He denies chest pain. He denies wheezing. He denies  cough or increased sputum production. He does have sleep apnea. He denies palpitations. He denies bleeding. He does note abdominal bloating and at times noted some chest discomfort after he eats associated with increased abdominal gas. This has improved with probiotic therapy. He denies myalgias.  There is no cold or heat intolerance. He does have diabetes Other comprehensive 12 system review is negative.  PE BP 180/90  Pulse 82  Ht 5'  11" (1.803 m)  Wt 208 lb (94.348 kg)  BMI 29.02 kg/m2  Repeat blood pressure by me was 150/90. Repeat blood pressure by me was 1 3270 supine and his blood pressure actually increased to 140/74 standing. There is no significant orthostatic pulse rise. General: Alert, oriented, no distress.  Skin: normal turgor, no rashes HEENT: Normocephalic, atraumatic. Pupils round and reactive; sclera anicteric;no lid lag.  Nose without nasal septal hypertrophy Mouth/Parynx benign; Mallinpatti scale 3 Neck: No JVD, no carotid briuts Lungs: clear to ausculatation and percussion; no wheezing or rales Heart: RRR, s1 s2 normal 1-2/6 systolic murmur. No S3  Abdomen: Moderate central adiposity soft, nontender; no hepatosplenomehaly, BS+; abdominal aorta nontender and not dilated by palpation. Pulses 2+ Extremities: no clubbing cyanosis or edema, Homan's sign negative  Neurologic: grossly nonfocal Psychologic: Normal affect and mood  ECG: Sinus rhythm at 82 beats per minute with previously noted the inferolateral T-wave abnormalities; however these T changes appear slightly more prominent than his last ECG in are diffuse.. Probable LVH with repolarization changes; incomplete right bundle branch block  LABS:  BMET    Component Value Date/Time   NA 133* 12/19/2012 2116   K 4.5 12/19/2012 2116   CL 97 12/19/2012 2116   CO2 24 12/19/2012 2116   GLUCOSE 143* 12/19/2012 2116   BUN 24* 12/19/2012 2116   CREATININE 0.95 12/19/2012 2116   CREATININE 1.13 10/08/2012 1314   CALCIUM 9.4  12/19/2012 2116   GFRNONAA 63* 10/08/2012 1314   GFRAA 73* 10/08/2012 1314     Hepatic Function Panel     Component Value Date/Time   PROT 6.8 12/19/2012 2116   ALBUMIN 3.5 12/19/2012 2116   AST 16 12/19/2012 2116   ALT 13 12/19/2012 2116   ALKPHOS 78 12/19/2012 2116   BILITOT 0.5 12/19/2012 2116     CBC    Component Value Date/Time   WBC 10.6* 12/19/2012 2119   WBC 11.1* 10/08/2012 1314   RBC 3.46* 12/19/2012 2119   RBC 4.11* 10/08/2012 1314   HGB 10.5* 12/19/2012 2119   HGB 13.6 10/08/2012 1314   HCT 34.1* 12/19/2012 2119   HCT 37.3* 10/08/2012 1314   PLT 260 10/08/2012 1314   MCV 98.6* 12/19/2012 2119   MCV 90.8 10/08/2012 1314   MCH 30.3 12/19/2012 2119   MCH 33.1 10/08/2012 1314   MCHC 30.8* 12/19/2012 2119   MCHC 36.5* 10/08/2012 1314   RDW 12.8 10/08/2012 1314   LYMPHSABS 2.6 02/12/2009 1520   MONOABS 0.8 02/12/2009 1520   EOSABS 0.2 02/12/2009 1520   BASOSABS 0.0 02/12/2009 1520     BNP    Component Value Date/Time   PROBNP <30.0 02/12/2009 1937    Lipid Panel     Component Value Date/Time   CHOL  Value: 133        ATP III CLASSIFICATION:  <200     mg/dL   Desirable  161-096  mg/dL   Borderline High  >=045    mg/dL   High        4/0/9811 0146   TRIG 152* 02/13/2009 0146   HDL 35* 02/13/2009 0146   CHOLHDL 3.8 02/13/2009 0146   VLDL 30 02/13/2009 0146   LDLCALC  Value: 68        Total Cholesterol/HDL:CHD Risk Coronary Heart Disease Risk Table                     Men   Women  1/2 Average Risk   3.4  3.3  Average Risk       5.0   4.4  2 X Average Risk   9.6   7.1  3 X Average Risk  23.4   11.0        Use the calculated Patient Ratio above and the CHD Risk Table to determine the patient's CHD Risk.        ATP III CLASSIFICATION (LDL):  <100     mg/dL   Optimal  213-086  mg/dL   Near or Above                    Optimal  130-159  mg/dL   Borderline  578-469  mg/dL   High  >629     mg/dL   Very High 06/15/8411 2440     RADIOLOGY: Dg Chest 2 View  10/08/2012   CLINICAL DATA:  Chest pain   EXAM: CHEST  2 VIEW  COMPARISON:  02/12/2009.  FINDINGS: Mild hyperinflation of the lungs. Prior CABG. Heart and mediastinal contours are within normal limits. No focal opacities or effusions. No acute bony abnormality.  IMPRESSION: No active cardiopulmonary disease.   Electronically Signed   By: Charlett Nose   On: 10/08/2012 14:55   Ct Head Wo Contrast  10/08/2012   *RADIOLOGY REPORT*  Clinical Data: Near-syncope  CT HEAD WITHOUT CONTRAST  Technique:  Contiguous axial images were obtained from the base of the skull through the vertex without contrast.  Comparison: 05/06/2007  Findings: No evidence of parenchymal hemorrhage or extra-axial fluid collection. No mass lesion, mass effect, or midline shift.  No CT evidence of acute infarction.  Subcortical white matter and periventricular small vessel ischemic changes.  Intracranial atherosclerosis.  Mild global cortical atrophy, likely age appropriate.  No ventriculomegaly.  The visualized paranasal sinuses are essentially clear. The mastoid air cells are unopacified.  No evidence of calvarial fracture.  IMPRESSION: No evidence of acute intracranial abnormality.  Mild atrophy with small vessel ischemic changes and intracranial atherosclerosis.   Original Report Authenticated By: Charline Bills, M.D.      ASSESSMENT AND PLAN:   Mr. Ayyad is status post CABG surgery in 1997 and has undergone several interventions in 2006 his last being in 2011 as noted above. His last nuclear perfusion study done earlier this year in February 2014 continue to show normal perfusion. Presently, he is not orthostatic. His blood pressure is well-controlled. He is not tachycardic or bradycardic. He does have diffuse T-wave changes which are prominent, but these are old and have been present on prior ECGs. Clinically, he is not having any symptomatology that he had prior to his bypass surgery or coronary interventions. However, he is not well beta blocked on his current dose of  carvedilol I am recommending further titration of this to 37.5 mg twice a day. Also recommending further titration of his losartan for improved blood pressure control and he will increase this to 50 mg twice a day. I have given him clearance to undergo the endoscopy will be available if necessary if problems arise. As long as he remains stable, I will see him in 3 months for followup evaluation.   Lennette Bihari, MD, [facility]  01/05/2013 11:24 AM

## 2013-01-06 ENCOUNTER — Encounter: Payer: Self-pay | Admitting: Cardiovascular Disease

## 2013-01-07 NOTE — Telephone Encounter (Signed)
Dr. Arlyce Dice pt is on Plavix. Do you want to do EGD while pt is on Plavix? Please advise.

## 2013-01-07 NOTE — Telephone Encounter (Signed)
Pts wife is calling back regarding her husband and him having an EGD. Please see the note that was sent below. Pt has lost weight and would like to have the procedure. Please advise if pt needs OV or just procedure.

## 2013-01-07 NOTE — Telephone Encounter (Signed)
Okay to schedule upper endoscopy and then will need an office visit

## 2013-01-07 NOTE — Telephone Encounter (Signed)
Pt scheduled for previsit for 01/14/13 and EGD scheduled for 01/21/13. Wife aware of appts.

## 2013-01-08 NOTE — Telephone Encounter (Signed)
Ok to stay on plavix

## 2013-01-13 ENCOUNTER — Encounter: Payer: Self-pay | Admitting: Family Medicine

## 2013-01-13 ENCOUNTER — Ambulatory Visit (INDEPENDENT_AMBULATORY_CARE_PROVIDER_SITE_OTHER): Payer: Medicare Other | Admitting: Family Medicine

## 2013-01-13 ENCOUNTER — Ambulatory Visit: Payer: Medicare Other | Admitting: Family Medicine

## 2013-01-13 VITALS — BP 149/75 | HR 73 | Temp 98.3°F | Resp 18 | Ht 69.5 in | Wt 209.0 lb

## 2013-01-13 DIAGNOSIS — R079 Chest pain, unspecified: Secondary | ICD-10-CM

## 2013-01-13 DIAGNOSIS — R3915 Urgency of urination: Secondary | ICD-10-CM

## 2013-01-13 DIAGNOSIS — K2270 Barrett's esophagus without dysplasia: Secondary | ICD-10-CM

## 2013-01-13 DIAGNOSIS — M2550 Pain in unspecified joint: Secondary | ICD-10-CM

## 2013-01-13 DIAGNOSIS — D649 Anemia, unspecified: Secondary | ICD-10-CM

## 2013-01-13 DIAGNOSIS — IMO0001 Reserved for inherently not codable concepts without codable children: Secondary | ICD-10-CM

## 2013-01-13 DIAGNOSIS — M791 Myalgia, unspecified site: Secondary | ICD-10-CM

## 2013-01-13 LAB — POCT URINALYSIS DIPSTICK
Blood, UA: NEGATIVE
Ketones, UA: NEGATIVE
Leukocytes, UA: NEGATIVE
Nitrite, UA: NEGATIVE
Urobilinogen, UA: 0.2

## 2013-01-13 LAB — POCT UA - MICROSCOPIC ONLY
WBC, Ur, HPF, POC: NEGATIVE
Yeast, UA: NEGATIVE

## 2013-01-13 LAB — POCT CBC
Granulocyte percent: 70 %G (ref 37–80)
HCT, POC: 40.7 % — AB (ref 43.5–53.7)
Lymph, poc: 2.2 (ref 0.6–3.4)
MCH, POC: 30.1 pg (ref 27–31.2)
MCHC: 31 g/dL — AB (ref 31.8–35.4)
MPV: 8.9 fL (ref 0–99.8)
POC Granulocyte: 6.9 (ref 2–6.9)
POC LYMPH PERCENT: 22.8 %L (ref 10–50)
RDW, POC: 13.8 %

## 2013-01-13 NOTE — Progress Notes (Signed)
Subjective:    Patient ID: [name], male    DOB: 1940/03/12, 72 y.o.   MRN: [id number]  HPI This 72 y.o. male presents three week follow-up of the following:  1.  Urinary urgency:  Three week follow-up for urinary urgency; at last visit, Urine culture negative; PSA normal; u/a normal.  S/p Cipro empirically for possible prostatitis with improvement in symptoms.   Testicle swelled on L and then resolved over past week.  History of recurrent kidney stones.   In past week, had sensation like a kidney stone pain.  Wife wants pt evaluated by urologist; was previously followed by [facility] but has not seen in quite while.   2.  Decreased appetite: three week follow-up; had been losing weight at last visit due to decreased appetite; family was very worried about lack of appetite; +developed chest pain substernal after last visit; thinks Prednisone made GERD worse;  Evaluated by GI who recommended cardiac clearance before undergoing EGD;  scheduled for EGD in one week.  3.  Anemia:  Three week follow-up; found at visit three weeks ago with Hgb of 10.5; Due for repeat H/H. Denies n/v/d/c; denies bloody stools or melena.  +epigastric abdominal pain; +decreased appetite has persisted.  Compliance with Omeprazole.    4. Myalgias:  Body aches are better; but now hands are stiff.  Hands are hurting; +pain went away for one week.  Hip pain is gone.  Really cannot tell if arthralgias have improved because "sitting on butt" because not feeling well.  Prednisone made stomach worse.  Completed Prednisone taper.  5. Chest pain:  Presented to GI due to chest pain on 12/30/12.  GI is very concerned about chest pain; referred back to cardiology; s/p cardiology consultation; s/p extensive cardiac work up in past year; s/p EKG; cardiology felt that chest pain GI in origin and not cardiac.  Scheduled for EGD next week.  Has known Barrett's esophagus.  Excessive belching; pain starts in epigastric region and radiates  into throat. +excessive gas.  Indigestion.  Taking Omeprazole.  Review of Systems  Constitutional: Positive for activity change, appetite change, fatigue and unexpected weight change. Negative for fever, chills and diaphoresis.  Respiratory: Negative for cough, shortness of breath, wheezing and stridor.   Cardiovascular: Positive for chest pain. Negative for palpitations and leg swelling.  Gastrointestinal: Positive for abdominal pain. Negative for nausea, vomiting, diarrhea, constipation, blood in stool, anal bleeding and rectal pain.  Genitourinary: Positive for scrotal swelling. Negative for dysuria, urgency, frequency, flank pain, discharge, penile swelling and testicular pain.  Musculoskeletal: Positive for arthralgias and myalgias. Negative for joint swelling.  Skin: Negative for rash.  Neurological: Negative for dizziness, facial asymmetry, light-headedness, numbness and headaches.  Hematological: Negative for adenopathy. Does not bruise/bleed easily.   Past Medical History  Diagnosis Date  . Coronary artery disease   . Hypertension   . Hyperlipidemia   . Diabetes   . Heart murmur   . Chronic kidney disease   . Myocardial infarction   . GERD (gastroesophageal reflux disease)    Past Surgical History  Procedure Laterality Date  . Coronary angioplasty with stent placement    . Coronary artery bypass graft    . Joint replacement     Allergies  Allergen Reactions  . Ivp Dye [Iodinated Diagnostic Agents] Other (See Comments)    Increased blood pressure, heart rate   Current Outpatient Prescriptions on File Prior to Visit  Medication Sig Dispense Refill  . amLODipine (NORVASC) 10 MG  tablet Take 10 mg by mouth daily.      Marland Kitchen aspirin EC 81 MG tablet Take 81 mg by mouth daily.      . calcium carbonate (TUMS - DOSED IN MG ELEMENTAL CALCIUM) 500 MG chewable tablet Chew 2 tablets by mouth daily as needed for heartburn.      . carvedilol (COREG) 25 MG tablet Take 1.5 tablets (37.5 mg  total) by mouth 2 (two) times daily with a meal.  90 tablet  9  . clopidogrel (PLAVIX) 75 MG tablet TAKE 1 TABLET EVERY DAY  30 tablet  10  . ezetimibe-simvastatin (VYTORIN) 10-20 MG per tablet Take 1 tablet by mouth at bedtime.      Marland Kitchen glimepiride (AMARYL) 1 MG tablet Take 1 tablet by mouth daily.      Marland Kitchen losartan (COZAAR) 50 MG tablet Take 1 tablet (50 mg total) by mouth 2 (two) times daily.  60 tablet  9  . metFORMIN (GLUCOPHAGE) 500 MG tablet Take 1,000 mg by mouth 2 (two) times daily with a meal.       . nitroGLYCERIN (NITROSTAT) 0.4 MG SL tablet Place 0.4 mg under the tongue every 5 (five) minutes as needed for chest pain.      Marland Kitchen omeprazole (PRILOSEC) 40 MG capsule Take 1 tab twice daily.  60 capsule  6  . ONE TOUCH ULTRA TEST test strip       . traMADol (ULTRAM) 50 MG tablet Take by mouth every 6 (six) hours as needed.       No current facility-administered medications on file prior to visit.   History   Social History Narrative  . No narrative on file      Objective:   Physical Exam  Nursing note and vitals reviewed. Constitutional: He is oriented to person, place, and time. He appears well-developed and well-nourished. No distress.  HENT:  Head: Normocephalic and atraumatic.  Mouth/Throat: Oropharynx is clear and moist.  Eyes: Conjunctivae are normal. Pupils are equal, round, and reactive to light.  Neck: Normal range of motion. Neck supple.  Cardiovascular: Normal rate, regular rhythm and normal heart sounds.   No murmur heard. Pulmonary/Chest: Effort normal and breath sounds normal. He has no wheezes.  Abdominal: Soft. Bowel sounds are normal. He exhibits no distension and no mass. There is no tenderness. There is no rebound and no guarding.  Genitourinary: Testes normal and penis normal. Right testis shows no mass, no swelling and no tenderness. Left testis shows no mass, no swelling and no tenderness.  Musculoskeletal:       Right shoulder: He exhibits decreased range of  motion.       Left shoulder: He exhibits decreased range of motion.       Lumbar back: Normal.  Lymphadenopathy:    He has no cervical adenopathy.  Neurological: He is alert and oriented to person, place, and time.  Skin: Skin is warm and dry. He is not diaphoretic.  Psychiatric: He has a normal mood and affect. His behavior is normal.   Results for orders placed in visit on 01/13/13  POCT CBC      Result Value Range   WBC 9.8  4.6 - 10.2 K/uL   Lymph, poc 2.2  0.6 - 3.4   POC LYMPH PERCENT 22.8  10 - 50 %L   MID (cbc) 0.7  0 - 0.9   POC MID % 7.2  0 - 12 %M   POC Granulocyte 6.9  2 - 6.9  Granulocyte percent 70.0  37 - 80 %G   RBC 4.18 (*) 4.69 - 6.13 M/uL   Hemoglobin 12.6 (*) 14.1 - 18.1 g/dL   HCT, POC 16.1 (*) 09.6 - 53.7 %   MCV 97.3 (*) 80 - 97 fL   MCH, POC 30.1  27 - 31.2 pg   MCHC 31.0 (*) 31.8 - 35.4 g/dL   RDW, POC 04.5     Platelet Count, POC 237  142 - 424 K/uL   MPV 8.9  0 - 99.8 fL  POCT URINALYSIS DIPSTICK      Result Value Range   Color, UA yellow     Clarity, UA clear     Glucose, UA neg     Bilirubin, UA neg     Ketones, UA neg     Spec Grav, UA 1.025     Blood, UA neg     pH, UA 5.5     Protein, UA trace     Urobilinogen, UA 0.2     Nitrite, UA neg     Leukocytes, UA Negative    POCT UA - MICROSCOPIC ONLY      Result Value Range   WBC, Ur, HPF, POC neg     RBC, urine, microscopic neg     Bacteria, U Microscopic trace     Mucus, UA neg     Epithelial cells, urine per micros neg     Crystals, Ur, HPF, POC neg     Casts, Ur, LPF, POC neg     Yeast, UA neg         Assessment & Plan:  Anemia - Plan: Sedimentation rate, POCT CBC  Myalgia - Plan: Sedimentation rate, POCT CBC  Arthralgia - Plan: Sedimentation rate, POCT CBC  Urinary urgency - Plan: POCT urinalysis dipstick, POCT UA - Microscopic Only  Chest pain  BARRETTS ESOPHAGUS   1. Anemia: improved; obtain iron studies; scheduled for EGD next week; hemoccult negative at visit  three weeks ago. 2.  Myalgias/Arthralgias: improved from last visit; obtain ESR.  CK normal at last visit.  If persists, consider rheumatology consultation. 3.  Urinary urgency: improved from last visit with Cipro to treat mild prostatitis. Urine culture negative; PSA normal at last visit three weeks ago. Episode of testicular swelling last week; normal exam today; if urinary symptoms recur and/or testicular symptoms recur, present for follow-up with urology. 4.  Chest pain: New. Onset after last visit; s/p cardiology consultation in past three weeks; felt secondary to GI process; scheduled for EGD next week. 5.  Barrett's esophagus: chronic issue; due for EGD.  No orders of the defined types were placed in this encounter.

## 2013-01-14 ENCOUNTER — Ambulatory Visit (AMBULATORY_SURGERY_CENTER): Payer: Self-pay | Admitting: *Deleted

## 2013-01-14 VITALS — Ht 69.0 in | Wt 211.6 lb

## 2013-01-14 DIAGNOSIS — R079 Chest pain, unspecified: Secondary | ICD-10-CM

## 2013-01-15 ENCOUNTER — Encounter: Payer: Self-pay | Admitting: Gastroenterology

## 2013-01-21 ENCOUNTER — Ambulatory Visit (AMBULATORY_SURGERY_CENTER): Payer: Medicare Other | Admitting: Gastroenterology

## 2013-01-21 ENCOUNTER — Encounter: Payer: Self-pay | Admitting: Gastroenterology

## 2013-01-21 VITALS — BP 134/68 | HR 63 | Temp 96.6°F | Resp 23 | Ht 69.0 in | Wt 211.0 lb

## 2013-01-21 DIAGNOSIS — K2270 Barrett's esophagus without dysplasia: Secondary | ICD-10-CM

## 2013-01-21 DIAGNOSIS — R079 Chest pain, unspecified: Secondary | ICD-10-CM

## 2013-01-21 LAB — GLUCOSE, CAPILLARY: Glucose-Capillary: 115 mg/dL — ABNORMAL HIGH (ref 70–99)

## 2013-01-21 MED ORDER — DEXLANSOPRAZOLE 60 MG PO CPDR: 60.0000 mg | DELAYED_RELEASE_CAPSULE | Freq: Every day | ORAL | Status: DC

## 2013-01-21 MED ORDER — SODIUM CHLORIDE 0.9 % IV SOLN: 500.0000 mL | INTRAVENOUS | Status: DC

## 2013-01-21 NOTE — Progress Notes (Signed)
Patient did not experience any of the following events: a burn prior to discharge; a fall within the facility; wrong site/side/patient/procedure/implant event; or a hospital transfer or hospital admission upon discharge from the facility. (G8907) Patient did not have preoperative order for IV antibiotic SSI prophylaxis. (G8918)  

## 2013-01-21 NOTE — Op Note (Signed)
[facility] [address] [place], [address]   ENDOSCOPY PROCEDURE REPORT  PATIENT: [name], [name]  MR#: [id number] BIRTHDATE: June 10, 1940 , 72  yrs. old GENDER: Male ENDOSCOPIST: Louis Meckel, MD REFERRED BY:  Georgann Housekeeper, M.D. PROCEDURE DATE:  01/21/2013 PROCEDURE:  EGD w/ biopsy ASA CLASS:     Class II INDICATIONS:  Surveillance.   history of Barrett's esophagus. MEDICATIONS: MAC sedation, administered by CRNA and Propofol (Diprivan) 130 mg IV TOPICAL ANESTHETIC:  DESCRIPTION OF PROCEDURE: After the risks benefits and alternatives of the procedure were thoroughly explained, informed consent was obtained.  The LB [id number] endoscope was introduced through the mouth and advanced to the third portion of the duodenum. Without limitations.  The instrument was slowly withdrawn as the mucosa was fully examined.      Barrett's epithelium was again noted beginning at approximately 35 cm from the incisors.  Biopsies in all 4 quadrants were taken at 2 different levels.  The GE junction was at approximately 40 cm. The remainder of the upper endoscopy exam was otherwise normal. Retroflexed views revealed no abnormalities.     The scope was then withdrawn from the patient and the procedure completed.  COMPLICATIONS: There were no complications. ENDOSCOPIC IMPRESSION: 1.   Barrett's Esophagus  RECOMMENDATIONS: Await biopsy results Begin dexilant 60mg  qd OV 1 month  REPEAT EXAM:  eSigned:  Louis Meckel, MD 01/21/2013 8:34 AM   CC: Daphene Jaeger, MD

## 2013-01-21 NOTE — Progress Notes (Signed)
Report to pacu rn, vss, bbs=clear 

## 2013-01-21 NOTE — Patient Instructions (Signed)

## 2013-01-22 ENCOUNTER — Telehealth: Payer: Self-pay | Admitting: *Deleted

## 2013-01-22 ENCOUNTER — Telehealth: Payer: Self-pay | Admitting: Gastroenterology

## 2013-01-22 MED ORDER — PANTOPRAZOLE SODIUM 40 MG PO TBEC: 40.0000 mg | DELAYED_RELEASE_TABLET | Freq: Every day | ORAL | Status: DC

## 2013-01-22 NOTE — Telephone Encounter (Signed)
  Follow up Call-  Call back number 01/21/2013  Post procedure Call Back phone  # [phone]  Permission to leave phone message Yes     Patient questions:  Do you have a fever, pain , or abdominal swelling? no Pain Score  0 *  Have you tolerated food without any problems? yes  Have you been able to return to your normal activities? yes  Do you have any questions about your discharge instructions: Diet   no Medications  no Follow up visit  no  Do you have questions or concerns about your Care? no  Actions: * If pain score is 4 or above: No action needed, pain <4.

## 2013-01-22 NOTE — Telephone Encounter (Signed)
Med sent in Protonix covered by insurance   Patient aware stated Dexilant was 95$

## 2013-01-23 NOTE — Telephone Encounter (Signed)
ok 

## 2013-01-27 ENCOUNTER — Telehealth: Payer: Self-pay

## 2013-01-27 NOTE — Telephone Encounter (Signed)
Dr. Katrinka Blazing - patient was sent for an Upper GI last Tuesday and he also passed a kidney stone before the Upper GI but after he saw you for labwork.  His hands are killing him and his hands and fingers are swollen.  He is having a hard time doing anything with his hands because they hurt so bad.  Please call them to discuss several questions that he and his wife have about what their next steps should be.  [phone]

## 2013-01-27 NOTE — Telephone Encounter (Signed)
I did agree to take patient on as new patient if this is what he desired.  I will be happy to follow-up with him at [facility] this week.

## 2013-01-27 NOTE — Telephone Encounter (Signed)
Message copied by Marlowe Kays on Mon Jan 27, 2013  9:36 AM ------      Message from: Melvia Heaps D      Created: Wed Jan 22, 2013 10:15 AM       Please call the patient to come to the office and give him 2 or 3 weeks worth of dexilant samples and have him return in about a month ------

## 2013-01-27 NOTE — Telephone Encounter (Signed)
Notes Recorded by Ethelda Chick, MD on 01/17/2013 at 9:36 AM Call --- inflammation marker (sed rate) in blood very elevated. After undergoing EGD in upcoming week, very important to follow-up with primary care physician, especially if joint and muscle aches persist. Please mail copy of labs to patient to take to PCP.  His sed rate is 126. This is very elevated, this could be associated with the hand / myalgia, he has not followed up with his PCP, indicates he would like for you to be his primary care. He will come in tomorrow to see you at the urgent care. I have indicated you can see him at the urgent care, but not sure if you are accepting new patients at this time, wife indicates you have agreed to accept him as patient, please advise.

## 2013-01-27 NOTE — Telephone Encounter (Signed)
Patient aware Protonix sent in and covered by insurance

## 2013-01-28 ENCOUNTER — Ambulatory Visit: Payer: Medicare Other

## 2013-01-28 ENCOUNTER — Ambulatory Visit (INDEPENDENT_AMBULATORY_CARE_PROVIDER_SITE_OTHER): Payer: Medicare Other | Admitting: Family Medicine

## 2013-01-28 VITALS — BP 134/68 | HR 68 | Temp 97.7°F | Resp 16 | Ht 68.75 in | Wt 207.0 lb

## 2013-01-28 DIAGNOSIS — K2270 Barrett's esophagus without dysplasia: Secondary | ICD-10-CM

## 2013-01-28 DIAGNOSIS — M7989 Other specified soft tissue disorders: Secondary | ICD-10-CM

## 2013-01-28 DIAGNOSIS — M79609 Pain in unspecified limb: Secondary | ICD-10-CM

## 2013-01-28 DIAGNOSIS — N200 Calculus of kidney: Secondary | ICD-10-CM

## 2013-01-28 DIAGNOSIS — D649 Anemia, unspecified: Secondary | ICD-10-CM

## 2013-01-28 LAB — POCT CBC
Granulocyte percent: 69.7 %G (ref 37–80)
HCT, POC: 49.6 % (ref 43.5–53.7)
Lymph, poc: 1.6 (ref 0.6–3.4)
MCH, POC: 30.5 pg (ref 27–31.2)
MCHC: 31 g/dL — AB (ref 31.8–35.4)
MID (cbc): 0.4 (ref 0–0.9)
POC Granulocyte: 4.6 (ref 2–6.9)
POC LYMPH PERCENT: 23.7 %L (ref 10–50)
Platelet Count, POC: 300 10*3/uL (ref 142–424)
RDW, POC: 13.9 %
WBC: 6.6 10*3/uL (ref 4.6–10.2)

## 2013-01-28 LAB — POCT URINALYSIS DIPSTICK
Bilirubin, UA: NEGATIVE
Blood, UA: NEGATIVE
Leukocytes, UA: NEGATIVE
Nitrite, UA: NEGATIVE
Protein, UA: 30
Spec Grav, UA: >=1.03
pH, UA: 5

## 2013-01-28 LAB — POCT SEDIMENTATION RATE: POCT SED RATE: 111 mm/hr — AB (ref 0–22)

## 2013-01-28 NOTE — Patient Instructions (Signed)
1.  START TAKING RANITIDINE/ZANTAC 150MG  ONE TABLET AS NEEDED FOR REFLUX/STOMACH PAIN. 2.  TAKE TYLENOL 500MG  1-2 EVERY EIGHT HOURS OR 1 TYLENOL WITH 1 ULTRAM/TRAMADOL EVERY 6-8 HOURS AS NEEDED FOR PAIN.

## 2013-01-28 NOTE — Progress Notes (Addendum)
Subjective:  This chart was scribed for Nilda Simmer, MD by Carl Best, Medical Scribe. This patient was seen in Room 3 and the patient's care was started at 8:49 AM.   Patient ID: [name], male    DOB: 1940-04-16, 72 y.o.   MRN: [id number]  HPI HPI Comments: [name] is a 72 y.o. male who presents to the [facility] for a two week follow up appointment.  The patient's wife states that they did not feel the prescription for Dexilant because the medication was too expensive.  The patient states that he takes Patoprazole once a day, first thing in the morning instead.  He states that last night he experienced severe chest pain radiating to his arms bilaterally after the patient ate too much food at dinner.  He states that he ate breaded tilapia and macaroni and cheese.  He states that he took four Tums with relief to his pain.  He states that his appetite has improved.  He states that he has been having regular bowel movements.    He is also complaining of the same bilateral hand swelling and pain starting at the wrist.  He states that he is experiencing hand pain from his wrists down bilaterally.  He states they are also stiff and it takes all day for the his hands to loosen up.  The patient states that the hand pain has affected his ability to work with his hands.  He states that he has never been diagnosed with Arthritis is his hands. The patient denies having a personal history of Gout.  He states that he normally takes Tylenol at night before bed to try and ease his hand pain in the morning with no relief.  He denies taking Tramadol for his symptoms.  He states that his hips and knees are also hurting bilaterally.  He states that it takes him a while to get up after he has been sitting for long periods of time.  He states that his knees are not swollen.  He denies shoulder pain and elbow pain as associated symptoms.    The patient states that he passed a  kidney stone after his EGD.    The patient has a history of epigastric pain.  He is status post EGD by Dr. Arlyce Dice on 12/9 that revealed Barrett's esophagus.  He was started on Dexilant 60 mg daily and to follow up with Dr. Arlyce Dice in one month.    Myalgias at visit two weeks ago body aches were better.  He was reporting hand stiffness and pain.  His hip pain had resolved at the last visit.  He could not really tell if his joint pains were any better because he had decreased activity.  His sedimentation rate at visit two weeks ago was elevated at 126.  Patient was advised to follow up with PCP after EGD to address elevated sed rate.  Patient's wife called yesterday.  The patient was having worsening hand pain and swelling and they planned to return to office to follow up of elevated sed rate.    Anemia had improved at last visit with a hemoglobin of 12.6.     Past Medical History  Diagnosis Date  . Coronary artery disease   . Hypertension   . Hyperlipidemia   . Diabetes   . Heart murmur   . Chronic kidney disease   . Myocardial infarction   . GERD (gastroesophageal reflux disease)    Past  Surgical History  Procedure Laterality Date  . Coronary angioplasty with stent placement    . Coronary artery bypass graft    . Joint replacement     Family History  Problem Relation Age of Onset  . Heart attack Brother   . Heart disease Brother   . Diabetes Mother   . Heart disease Mother   . Hypertension Mother   . Cancer Father   . Diabetes Sister   . Hypertension Sister   . Heart disease Brother   . Diabetes Brother   . Colon cancer Neg Hx    History   Social History  . Marital Status: Married    Spouse Name: N/A    Number of Children: N/A  . Years of Education: N/A   Occupational History  . Not on file.   Social History Main Topics  . Smoking status: Never Smoker   . Smokeless tobacco: Never Used  . Alcohol Use: Yes     Comment: 2-3 beers a month.  . Drug Use: No  . Sexual  Activity: Not on file   Other Topics Concern  . Not on file   Social History Narrative  . No narrative on file   Allergies  Allergen Reactions  . Ivp Dye [Iodinated Diagnostic Agents] Other (See Comments)    Increased blood pressure, heart rate     Review of Systems  Constitutional: Positive for activity change, appetite change and fatigue. Negative for fever, chills, diaphoresis and unexpected weight change.  Respiratory: Negative for cough, shortness of breath, wheezing and stridor.   Cardiovascular: Negative for chest pain, palpitations and leg swelling.  Gastrointestinal: Positive for abdominal pain. Negative for nausea, vomiting, diarrhea, constipation, blood in stool and anal bleeding.  Musculoskeletal: Positive for arthralgias (shoulder and elbow), joint swelling (bilateral hands) and myalgias (bilateral hands).  Skin: Negative for rash.  Neurological: Negative for dizziness, tremors, seizures, syncope, speech difficulty, weakness, light-headedness, numbness and headaches.  Hematological: Negative for adenopathy. Does not bruise/bleed easily.  All other systems reviewed and are negative.     Objective:  Physical Exam  Nursing note and vitals reviewed. Constitutional: He is oriented to person, place, and time. He appears well-developed and well-nourished. No distress.  HENT:  Head: Normocephalic and atraumatic.  Eyes: Conjunctivae and EOM are normal. Pupils are equal, round, and reactive to light.  Neck: Neck supple.  Cardiovascular: Normal rate, regular rhythm and normal heart sounds.  Exam reveals no gallop and no friction rub.   No murmur heard. Pulmonary/Chest: Effort normal and breath sounds normal. No respiratory distress. He has no wheezes. He has no rales.  Abdominal: Soft. Bowel sounds are normal. He exhibits no distension and no mass. There is no tenderness. There is no rebound and no guarding.  Musculoskeletal:  Swelling at the PIP joints diffusely.  Diffuse  swelling on right hand.  Pain with ROM of bilateral hands.  Decreased grip in bilateral hands.  Neurological: He is alert and oriented to person, place, and time. No cranial nerve deficit.  Psychiatric: He has a normal mood and affect. His behavior is normal.    BP 134/68  Pulse 68  Temp(Src) 97.7 F (36.5 C) (Oral)  Resp 16  Ht 5' 8.75" (1.746 m)  Wt 207 lb (93.895 kg)  BMI 30.80 kg/m2  SpO2 96%  Results for orders placed in visit on 01/28/13  POCT CBC      Result Value Range   WBC 6.6  4.6 - 10.2 K/uL   Lymph,  poc 1.6  0.6 - 3.4   POC LYMPH PERCENT 23.7  10 - 50 %L   MID (cbc) 0.4  0 - 0.9   POC MID % 6.6  0 - 12 %M   POC Granulocyte 4.6  2 - 6.9   Granulocyte percent 69.7  37 - 80 %G   RBC 5.05  4.69 - 6.13 M/uL   Hemoglobin 15.4  14.1 - 18.1 g/dL   HCT, POC 16.1  09.6 - 53.7 %   MCV 98.3 (*) 80 - 97 fL   MCH, POC 30.5  27 - 31.2 pg   MCHC 31.0 (*) 31.8 - 35.4 g/dL   RDW, POC 04.5     Platelet Count, POC 300  142 - 424 K/uL   MPV 7.74  0 - 99.8 fL  POCT URINALYSIS DIPSTICK      Result Value Range   Color, UA yellow     Clarity, UA clear     Glucose, UA neg     Bilirubin, UA neg     Ketones, UA trace     Spec Grav, UA >=1.030     Blood, UA neg     pH, UA 5.0     Protein, UA 30     Urobilinogen, UA 0.2     Nitrite, UA neg     Leukocytes, UA Negative     [facility] reading (PRIMARY) by  Dr. Katrinka Blazing.  HAND FILMS: NAD    Assessment & Plan:  BARRETTS ESOPHAGUS  Pain in limb - Plan: POCT CBC, POCT SEDIMENTATION RATE, Rheumatoid factor, ANA, Uric Acid, DG Hand Complete Left  Anemia, unspecified - Plan: POCT CBC, POCT SEDIMENTATION RATE, Rheumatoid factor  Nephrolithiasis - Plan: POCT CBC, POCT urinalysis dipstick  Pain in hand, unspecified laterality - Plan: Ambulatory referral to Rheumatology  Swelling of hand - Plan: Ambulatory referral to Rheumatology  No orders of the defined types were placed in this encounter.   1. Barrett's esophagus:  New. S/p EGD by  Arlyce Dice; started on Dexilant 60mg  daily. 2.  Pain/swelling hands B:  New.  With elevated ESR at last visit; repeat today; obtain autoimmune labs and refer to rheumatology. 3. Anemia: New at last visit; repeat today normal.  4.  Nephrolithiasis:  New/recurrent; pt reports passing a stone after EGD; urine negative today.   I personally performed the services described in this documentation, which was scribed in my presence. The recorded information has been reviewed and is accurate.  Nilda Simmer, M.D.  [facility] [address] [place], [place]  [address] [phone] phone [phone] fax

## 2013-01-30 NOTE — Telephone Encounter (Addendum)
What? Patient wants pain medications, states the Tramadol does not help please advise.

## 2013-01-30 NOTE — Telephone Encounter (Signed)
Requesting tramadol - IT DON"T WORK - don't know what happened to the script.   [phone]

## 2013-01-31 MED ORDER — HYDROCODONE-ACETAMINOPHEN 5-325 MG PO TABS: 1.0000 | ORAL_TABLET | Freq: Four times a day (QID) | ORAL | Status: DC | PRN

## 2013-01-31 NOTE — Telephone Encounter (Signed)
Notified pt's wife and gave her option of trying 2 tabs of Tramadol at a time, or come p/up Rx for hydrododone. She agreed she will come and p/up Rx tomorrow, prob can not make it tonight before closing. Advised precautions.

## 2013-01-31 NOTE — Telephone Encounter (Signed)
Please call patient --- I have written rx for hydrocodone for pain; this will make him drowsy; he cannot drive or operate machinery with hydrocodone but can take it as needed for pain in the evenings.  He must pick up rx; I cannot call into pharmacy because it is a narcotic.

## 2013-01-31 NOTE — Telephone Encounter (Signed)
Patient has called again, states Tramadol not helping wants something else for pain

## 2013-02-03 ENCOUNTER — Encounter: Payer: Self-pay | Admitting: Gastroenterology

## 2013-02-15 ENCOUNTER — Telehealth: Payer: Self-pay | Admitting: Family Medicine

## 2013-02-15 NOTE — Telephone Encounter (Signed)
Pt wife called and requesting Hydrocodone/acetaminophen 5-325 mg. Pt wife request c/b when ready for pick up. Pt is almost out of med and in a lot of pain.

## 2013-02-17 ENCOUNTER — Telehealth: Payer: Self-pay

## 2013-02-17 MED ORDER — HYDROCODONE-ACETAMINOPHEN 5-325 MG PO TABS: 1.0000 | ORAL_TABLET | Freq: Four times a day (QID) | ORAL | Status: DC | PRN

## 2013-02-17 NOTE — Telephone Encounter (Signed)
Patients wife called again for hydrocodone. He has appt with specialist on Thursday. Please advise. They do have a 30 minute drive and would like to pick up soon, before it gets dark.

## 2013-02-17 NOTE — Telephone Encounter (Signed)
Call -- -hydrocodone ready for pick up. 

## 2013-02-17 NOTE — Telephone Encounter (Signed)
Pt's wife is calling for a refill of Hydrocodone for him until he sees the arthritis specialist on Thursday. Please call  [phone]

## 2013-02-17 NOTE — Telephone Encounter (Signed)
Rx approved and signed and ready for pick up.

## 2013-02-18 NOTE — Telephone Encounter (Signed)
Pt notified ready in drawer.

## 2013-02-20 NOTE — Telephone Encounter (Signed)
Pt picked up.

## 2013-02-24 ENCOUNTER — Ambulatory Visit: Payer: Medicare Other | Admitting: Gastroenterology

## 2013-03-25 ENCOUNTER — Ambulatory Visit (INDEPENDENT_AMBULATORY_CARE_PROVIDER_SITE_OTHER): Payer: Medicare Other | Admitting: Gastroenterology

## 2013-03-25 ENCOUNTER — Encounter: Payer: Self-pay | Admitting: Gastroenterology

## 2013-03-25 VITALS — BP 124/62 | HR 68 | Ht 68.75 in | Wt 202.2 lb

## 2013-03-25 DIAGNOSIS — R079 Chest pain, unspecified: Secondary | ICD-10-CM

## 2013-03-25 DIAGNOSIS — K2270 Barrett's esophagus without dysplasia: Secondary | ICD-10-CM

## 2013-03-25 NOTE — Assessment & Plan Note (Signed)
The patient describes chest pain when he begins walking immediately postprandially.  Symptoms are suggestive of angina although he claims pain subsides with antacids.  He is scheduled to see his cardiologist within the next week.  In the interim he will continue Protonix and antacids as needed.

## 2013-03-25 NOTE — Patient Instructions (Signed)
Follow up as needed

## 2013-03-25 NOTE — Assessment & Plan Note (Signed)
Stable without dysplastic changes.    Recommendations #1 followup endoscopy in 3 years

## 2013-03-25 NOTE — Progress Notes (Signed)
          History of Present Illness:  The patient has returned following upper endoscopy where Barrett's esophagus was identified.  Biopsies did not demonstrate any dysplastic changes.  On Protonix he is doing well.  He has occasional pyrosis.  He does relate that he often will develop some chest tightness postprandially if he immediately walks to his mailbox.  Symptoms subside when he takes antacids or he rests.    Review of Systems: Pertinent positive and negative review of systems were noted in the above HPI section. All other review of systems were otherwise negative.    Current Medications, Allergies, Past Medical History, Past Surgical History, Family History and Social History were reviewed in [employer] electronic medical record  Vital signs were reviewed in today's medical record. Physical Exam: General: Well developed , well nourished, no acute distress   See Assessment and Plan under Problem List

## 2013-04-03 ENCOUNTER — Encounter: Payer: Self-pay | Admitting: Cardiovascular Disease

## 2013-04-03 ENCOUNTER — Ambulatory Visit (INDEPENDENT_AMBULATORY_CARE_PROVIDER_SITE_OTHER): Payer: Medicare Other | Admitting: Cardiovascular Disease

## 2013-04-03 VITALS — BP 152/84 | HR 70 | Ht 71.0 in | Wt 201.4 lb

## 2013-04-03 DIAGNOSIS — E785 Hyperlipidemia, unspecified: Secondary | ICD-10-CM

## 2013-04-03 DIAGNOSIS — K2270 Barrett's esophagus without dysplasia: Secondary | ICD-10-CM

## 2013-04-03 DIAGNOSIS — Z79899 Other long term (current) drug therapy: Secondary | ICD-10-CM

## 2013-04-03 DIAGNOSIS — G4730 Sleep apnea, unspecified: Secondary | ICD-10-CM

## 2013-04-03 DIAGNOSIS — M1990 Unspecified osteoarthritis, unspecified site: Secondary | ICD-10-CM

## 2013-04-03 DIAGNOSIS — M129 Arthropathy, unspecified: Secondary | ICD-10-CM

## 2013-04-03 DIAGNOSIS — I2510 Atherosclerotic heart disease of native coronary artery without angina pectoris: Secondary | ICD-10-CM

## 2013-04-03 DIAGNOSIS — I10 Essential (primary) hypertension: Secondary | ICD-10-CM

## 2013-04-03 MED ORDER — LOSARTAN POTASSIUM-HCTZ 100-12.5 MG PO TABS: 1.0000 | ORAL_TABLET | Freq: Every day | ORAL | Status: DC

## 2013-04-03 NOTE — Patient Instructions (Signed)
Your physician has recommended you make the following change in your medication: stop the losartan. Start the new prescription for losartan-hct. This has already been sent to the pharmacy.  Your physician recommends that you return for lab work in: 2 weeks. Your physician recommends that you schedule a follow-up appointment in: 3-4 months.

## 2013-04-05 ENCOUNTER — Encounter: Payer: Self-pay | Admitting: Cardiovascular Disease

## 2013-04-05 DIAGNOSIS — M1990 Unspecified osteoarthritis, unspecified site: Secondary | ICD-10-CM | POA: Insufficient documentation

## 2013-04-05 NOTE — Progress Notes (Signed)
Patient ID: [name], male   DOB: [birth year], 73 y.o.   MRN: [id number]       HPI: [name], is a 73 y.o. male who presents to the office for followup evaluation.  [name]  has established coronary artery disease dating back to 1997 as is s/p CABG revascularization surgery. In 2006 he underwent stenting of the midportion of the graft to the right coronary artery and cutting balloon to the LAD after the LIMA insertion. On New Year's Eve 2011 he developed an acute coronary syndrome was found to have a new subtotal stenosis in the graft to right coronary artery more distal to the prior stent and also had in-stent narrowing to the proximal portion of the previously placed stent. He underwent successful intervention with a 3.5x18 mm stent. His LIMA to the LAD was patent. The graft to the circumflex was occluded. He had 90% stenosis in a small marginal vessel. He does have a history of hypertension, type 2 diabetes mellitus, as well as hyperlipidemia. His last nuclear perfusion study was in February 2014 which continued to show normal perfusion without scar or ischemia. An echo Doppler study at that time showed an ejection fraction of 55-60% with mild aortic sclerosis without stenosis. He had mild pulmonary hypertension with a PA pressure 32 mm.  In August 2014, he developed a presyncopal spell after he'd been dragging and unloading logs from a stroller. He apparently was evaluated in the emergency room. Head CT did not show acute abnormalities although did suggest small vessel disease. Chest x-ray was normal. Cardiac enzymes were negative. Hemoglobin was 13.6 hematocrit 37.3. Renal function revealed a BUN of 25 currently 1.13. I saw him in the office several days later and felt that his symptoms occurred in the setting of mild dehydration with significant straining possibly inducing a vagal event while he was dragging heavy logs.   He does have a history of Barrett's esophagus. Recently, he  has developed episodes of chest pain typically after he eats. This has been associated with a sensation of increased gas. He feels symptoms radiating from his stomach into his esophagus. Recently, he started taking over-the-counter probiotics and some of his abdominal bloating and gas has significantly improved. He denies recent chest pressure that is similar to his prior angina. He denies any change in shortness of breath development. He recently saw the [facility] for Dr[facility]. Because of this chest pain history it was recommended that he see me prior to planned endoscopy when I saw him in November 2014.  He did undergo his procedure. He tolerated it well. He also tells me he is being cared for by the by Dr. Dierdre Forth for his arthritis. He denies any exertional chest pain. He denies shortness of breath. He is unaware of palpitations. He does have diabetes. He has been taking his Bentyl prednisone for his Barrett's esophagus and GERD.  Past Medical History  Diagnosis Date  . Coronary artery disease   . Hypertension   . Hyperlipidemia   . Diabetes   . Heart murmur   . Chronic kidney disease   . Myocardial infarction   . GERD (gastroesophageal reflux disease)   . Arthritis     Past Surgical History  Procedure Laterality Date  . Coronary angioplasty with stent placement    . Coronary artery bypass graft    . Joint replacement      Allergies  Allergen Reactions  . Ivp Dye [Iodinated Diagnostic Agents] Other (See  Comments)    Increased blood pressure, heart rate    Current Outpatient Prescriptions  Medication Sig Dispense Refill  . amLODipine (NORVASC) 10 MG tablet Take 10 mg by mouth daily.      Marland Kitchen aspirin EC 81 MG tablet Take 81 mg by mouth daily.      . calcium carbonate (TUMS - DOSED IN MG ELEMENTAL CALCIUM) 500 MG chewable tablet Chew 2 tablets by mouth daily as needed for heartburn.      . carvedilol (COREG) 25 MG tablet Take 1.5 tablets (37.5 mg total) by mouth 2 (two) times  daily with a meal.  90 tablet  9  . clopidogrel (PLAVIX) 75 MG tablet TAKE 1 TABLET EVERY DAY  30 tablet  10  . ezetimibe-simvastatin (VYTORIN) 10-20 MG per tablet Take 1 tablet by mouth at bedtime.      Marland Kitchen glimepiride (AMARYL) 1 MG tablet Take 1 tablet by mouth daily.      . metFORMIN (GLUCOPHAGE) 500 MG tablet Take 1,000 mg by mouth 2 (two) times daily with a meal.       . methotrexate (RHEUMATREX) 2.5 MG tablet Take 2.5 tablets by mouth once a week. Takes 6 tablets      . nitroGLYCERIN (NITROSTAT) 0.4 MG SL tablet Place 0.4 mg under the tongue every 5 (five) minutes as needed for chest pain.      . pantoprazole (PROTONIX) 40 MG tablet Take 1 tablet (40 mg total) by mouth daily.  30 tablet  6  . losartan-hydrochlorothiazide (HYZAAR) 100-12.5 MG per tablet Take 1 tablet by mouth daily.  30 tablet  11   No current facility-administered medications for this visit.    History   Social History  . Marital Status: Married    Spouse Name: N/A    Number of Children: 1  . Years of Education: N/A   Occupational History  . Retired    Social History Main Topics  . Smoking status: Never Smoker   . Smokeless tobacco: Never Used  . Alcohol Use: Yes     Comment: 2-3 beers a month.  . Drug Use: No  . Sexual Activity: Not on file   Other Topics Concern  . Not on file   Social History Narrative  . No narrative on file   Social history notable that he is married he has one child. There is no tobacco use. He does not routinely exercise. He does drink alcohol infrequently.  Family History  Problem Relation Age of Onset  . Heart attack Brother   . Heart disease Brother   . Diabetes Mother   . Heart disease Mother   . Hypertension Mother   . Cancer Father   . Diabetes Sister   . Hypertension Sister   . Heart disease Brother   . Diabetes Brother   . Colon cancer Neg Hx     ROS is negative for fevers, chills or night sweats. He denies skin rash. He denies change in vision or hearing. He  denies any further episodes of dizziness. He denies chest pain. He denies wheezing. He denies cough or increased sputum production. He does have sleep apnea. He denies palpitations. He denies bleeding. He does note abdominal bloating and at times noted some chest discomfort after he eats associated with increased abdominal gas. This has improved with probiotic therapy. He denies myalgias.  There is no cold or heat intolerance. He does have diabetes Other comprehensive 124system review is negative.  PE BP 152/84  Pulse 70  Ht 5\' 11"  (1.803 m)  Wt 201 lb 6.4 oz (91.354 kg)  BMI 28.10 kg/m2  Repeat blood pressure by me was 160/80 General: Alert, oriented, no distress.  Skin: normal turgor, no rashes HEENT: Normocephalic, atraumatic. Pupils round and reactive; sclera anicteric;no lid lag. No xanthelasmas. Nose without nasal septal hypertrophy Mouth/Parynx benign; Mallinpatti scale 3 Neck: No JVD, no carotid bruits with normal carotid upstroke Lungs: clear to ausculatation and percussion; no wheezing or rales Heart: RRR, s1 s2 normal 1-2/6 systolic murmur. No diastolic murmur. No rub thrills or heaves. No S3  Abdomen: Moderate central adiposity soft, nontender; no hepatosplenomehaly, BS+; abdominal aorta nontender and not dilated by palpation. Back: No CVA tenderness Pulses 2+ Extremities: no clubbing cyanosis or edema, Homan's sign negative  Neurologic: grossly nonfocal Psychologic: Normal affect and mood  ECG (independently read by me): Sinus rhythm with inferolateral ST-T changes, unchanged  Prior ECG of 01/03/2013: Sinus rhythm at 82 beats per minute with previously noted the inferolateral T-wave abnormalities; however these T changes appear slightly more prominent than his last ECG in are diffuse. Probable LVH with repolarization changes; incomplete right bundle branch block  LABS:  BMET    Component Value Date/Time   NA 133* 12/19/2012 2116   K 4.5 12/19/2012 2116   CL 97 12/19/2012  2116   CO2 24 12/19/2012 2116   GLUCOSE 143* 12/19/2012 2116   BUN 24* 12/19/2012 2116   CREATININE 0.95 12/19/2012 2116   CREATININE 1.13 10/08/2012 1314   CALCIUM 9.4 12/19/2012 2116   GFRNONAA 63* 10/08/2012 1314   GFRAA 73* 10/08/2012 1314     Hepatic Function Panel     Component Value Date/Time   PROT 6.8 12/19/2012 2116   ALBUMIN 3.5 12/19/2012 2116   AST 16 12/19/2012 2116   ALT 13 12/19/2012 2116   ALKPHOS 78 12/19/2012 2116   BILITOT 0.5 12/19/2012 2116     CBC    Component Value Date/Time   WBC 6.6 01/28/2013 0943   WBC 11.1* 10/08/2012 1314   RBC 5.05 01/28/2013 0943   RBC 4.11* 10/08/2012 1314   HGB 15.4 01/28/2013 0943   HGB 13.6 10/08/2012 1314   HCT 49.6 01/28/2013 0943   HCT 37.3* 10/08/2012 1314   PLT 260 10/08/2012 1314   MCV 98.3* 01/28/2013 0943   MCV 90.8 10/08/2012 1314   MCH 30.5 01/28/2013 0943   MCH 33.1 10/08/2012 1314   MCHC 31.0* 01/28/2013 0943   MCHC 36.5* 10/08/2012 1314   RDW 12.8 10/08/2012 1314   LYMPHSABS 2.6 02/12/2009 1520   MONOABS 0.8 02/12/2009 1520   EOSABS 0.2 02/12/2009 1520   BASOSABS 0.0 02/12/2009 1520     BNP    Component Value Date/Time   PROBNP <30.0 02/12/2009 1937    Lipid Panel     Component Value Date/Time   CHOL  Value: 133        ATP III CLASSIFICATION:  <200     mg/dL   Desirable  02/14/2009  mg/dL   Borderline High  798-921    mg/dL   High        >=194 0146   TRIG 152* 02/13/2009 0146   HDL 35* 02/13/2009 0146   CHOLHDL 3.8 02/13/2009 0146   VLDL 30 02/13/2009 0146   LDLCALC  Value: 68        Total Cholesterol/HDL:CHD Risk Coronary Heart Disease Risk Table  Men   Women  1/2 Average Risk   3.4   3.3  Average Risk       5.0   4.4  2 X Average Risk   9.6   7.1  3 X Average Risk  23.4   11.0        Use the calculated Patient Ratio above and the CHD Risk Table to determine the patient's CHD Risk.        ATP III CLASSIFICATION (LDL):  <100     mg/dL   Optimal  932-355  mg/dL   Near or Above                    Optimal   130-159  mg/dL   Borderline  732-202  mg/dL   High  >542     mg/dL   Very High 7/0/6237 6283     RADIOLOGY: Dg Chest 2 View  10/08/2012   CLINICAL DATA:  Chest pain  EXAM: CHEST  2 VIEW  COMPARISON:  02/12/2009.  FINDINGS: Mild hyperinflation of the lungs. Prior CABG. Heart and mediastinal contours are within normal limits. No focal opacities or effusions. No acute bony abnormality.  IMPRESSION: No active cardiopulmonary disease.   Electronically Signed   By: Charlett Nose   On: 10/08/2012 14:55   Ct Head Wo Contrast  10/08/2012   *RADIOLOGY REPORT*  Clinical Data: Near-syncope  CT HEAD WITHOUT CONTRAST  Technique:  Contiguous axial images were obtained from the base of the skull through the vertex without contrast.  Comparison: 05/06/2007  Findings: No evidence of parenchymal hemorrhage or extra-axial fluid collection. No mass lesion, mass effect, or midline shift.  No CT evidence of acute infarction.  Subcortical white matter and periventricular small vessel ischemic changes.  Intracranial atherosclerosis.  Mild global cortical atrophy, likely age appropriate.  No ventriculomegaly.  The visualized paranasal sinuses are essentially clear. The mastoid air cells are unopacified.  No evidence of calvarial fracture.  IMPRESSION: No evidence of acute intracranial abnormality.  Mild atrophy with small vessel ischemic changes and intracranial atherosclerosis.   Original Report Authenticated By: Charline Bills, M.D.      ASSESSMENT AND PLAN:  [name] is a 73 year old white male who is status post CABG surgery in 1997 and has undergone several interventions in 2006 his last being in 2011 as noted above. His last nuclear perfusion study done in February 2014 continue to show normal perfusion. When I last saw him, he was not well beta blocked on his carvedilol and I further titrated this to 37.5 mg twice a day and I further titrated his losartan to 50 mg twice a day for improved blood pressure control.   His blood pressure today remains elevated at 160/80. I recommended we discontinue the losartan 100 mg and changes to losartan HCT 100/12.5. I also recommended significant reduction in sodium intake in his diet. He now is on methotrexate for his arthritis followed by Dr. Dierdre Forth. His Barrett's esophagus and GERD appeared to be stable on his pantoprazole. He is not having any anginal symptoms or dyspnea. Repeat blood work will be checked and we will notify him regarding the results if adjustments need to be made in his medical regimen. I will see him in 3 months for followup evaluation. Lennette Bihari, MD, [facility]  04/05/2013 12:04 PM

## 2013-04-28 LAB — CBC
HCT: 36.4 % — ABNORMAL LOW (ref 39.0–52.0)
Hemoglobin: 12.4 g/dL — ABNORMAL LOW (ref 13.0–17.0)
MCH: 30.7 pg (ref 26.0–34.0)
MCHC: 34.1 g/dL (ref 30.0–36.0)
MCV: 90.1 fL (ref 78.0–100.0)
Platelets: 314 10*3/uL (ref 150–400)
RBC: 4.04 MIL/uL — ABNORMAL LOW (ref 4.22–5.81)
RDW: 15.9 % — AB (ref 11.5–15.5)
WBC: 7.9 10*3/uL (ref 4.0–10.5)

## 2013-04-28 LAB — BASIC METABOLIC PANEL
BUN: 27 mg/dL — ABNORMAL HIGH (ref 6–23)
CALCIUM: 9.7 mg/dL (ref 8.4–10.5)
CO2: 27 mEq/L (ref 19–32)
CREATININE: 0.81 mg/dL (ref 0.50–1.35)
Chloride: 103 mEq/L (ref 96–112)
GLUCOSE: 105 mg/dL — AB (ref 70–99)
POTASSIUM: 4.3 meq/L (ref 3.5–5.3)
Sodium: 138 mEq/L (ref 135–145)

## 2013-06-04 ENCOUNTER — Encounter: Payer: Self-pay | Admitting: *Deleted

## 2013-07-10 ENCOUNTER — Telehealth: Payer: Self-pay | Admitting: Cardiovascular Disease

## 2013-07-10 MED ORDER — NITROGLYCERIN 0.4 MG SL SUBL: 0.4000 mg | SUBLINGUAL_TABLET | SUBLINGUAL | Status: DC | PRN

## 2013-07-10 NOTE — Telephone Encounter (Signed)
Called patient/wife to inform refill will be provided. Asked patient if he had been having any issues regarding chest discomfort since he needed a NTG refill. He states she has problems with reflux and chest discomfort mostly at night after he eats. He was taking tums to help with this discomfort but that has stopped working so he decided to try NTG. He has only had to take 1 NTG and has had relief with this. His last OV as 03/2013 with Dr. Tresa Endo. Patient denies SOB/lightheadedness/dizziness.   Will forward on to Dr. Tresa Endo / Vernona Rieger, NP (since Dr. Tresa Endo is out of office) to advise if they think patient should be evaluated.  Wife stated she thought patient should come in.Marland Kitchen

## 2013-07-10 NOTE — Telephone Encounter (Signed)
Need a new prescription for his nitroglycerin.Please call to [phone]

## 2013-07-11 NOTE — Telephone Encounter (Signed)
Yes I would have him come in just to recheck EKG and BP.  Most likely GI and NTG would help that as well, but to be sure.  He was supposed to see Dr. Tresa Endo in 3 months from Feb.

## 2013-07-11 NOTE — Telephone Encounter (Signed)
LMTCB

## 2013-07-14 NOTE — Telephone Encounter (Signed)
Message acknowledged

## 2013-07-14 NOTE — Telephone Encounter (Signed)
Called wife and offered her husband an EKG & BP check as nurse visit. Reminded of OV 6/15 with Dr. Tresa Endo. She states he is not complaining of chest discomfort as often.. And she doesn't think he is using NTG as frequently. They will call office should problems arise between now and 6/15. They declined EKG & BP check visit.

## 2013-07-26 ENCOUNTER — Other Ambulatory Visit: Payer: Self-pay | Admitting: Gastroenterology

## 2013-07-28 ENCOUNTER — Encounter (HOSPITAL_COMMUNITY): Payer: Self-pay | Admitting: Pharmacy Technician

## 2013-07-28 ENCOUNTER — Encounter: Payer: Self-pay | Admitting: Cardiovascular Disease

## 2013-07-28 ENCOUNTER — Ambulatory Visit (INDEPENDENT_AMBULATORY_CARE_PROVIDER_SITE_OTHER): Payer: Medicare Other | Admitting: Cardiovascular Disease

## 2013-07-28 ENCOUNTER — Ambulatory Visit
Admission: RE | Admit: 2013-07-28 | Discharge: 2013-07-28 | Disposition: A | Discharge status: Home / Self Care | Payer: Medicare Other | Source: Ambulatory Visit | Attending: Cardiovascular Disease | Admitting: Cardiovascular Disease

## 2013-07-28 VITALS — BP 132/64 | HR 64 | Ht 71.0 in | Wt 205.2 lb

## 2013-07-28 DIAGNOSIS — Z8679 Personal history of other diseases of the circulatory system: Secondary | ICD-10-CM

## 2013-07-28 DIAGNOSIS — I2510 Atherosclerotic heart disease of native coronary artery without angina pectoris: Secondary | ICD-10-CM

## 2013-07-28 DIAGNOSIS — K2270 Barrett's esophagus without dysplasia: Secondary | ICD-10-CM

## 2013-07-28 DIAGNOSIS — Z01812 Encounter for preprocedural laboratory examination: Secondary | ICD-10-CM

## 2013-07-28 DIAGNOSIS — G4730 Sleep apnea, unspecified: Secondary | ICD-10-CM

## 2013-07-28 DIAGNOSIS — E785 Hyperlipidemia, unspecified: Secondary | ICD-10-CM

## 2013-07-28 DIAGNOSIS — I10 Essential (primary) hypertension: Secondary | ICD-10-CM

## 2013-07-28 LAB — LIPID PANEL
CHOL/HDL RATIO: 3.9 ratio
Cholesterol: 204 mg/dL — ABNORMAL HIGH (ref 0–200)
HDL: 52 mg/dL (ref >39)
LDL CALC: 133 mg/dL — AB (ref 0–99)
Triglycerides: 93 mg/dL (ref <150)
VLDL: 19 mg/dL (ref 0–40)

## 2013-07-28 LAB — COMPREHENSIVE METABOLIC PANEL
ALBUMIN: 4.3 g/dL (ref 3.5–5.2)
ALT: 10 U/L (ref 0–53)
AST: 14 U/L (ref 0–37)
Alkaline Phosphatase: 66 U/L (ref 39–117)
BUN: 29 mg/dL — ABNORMAL HIGH (ref 6–23)
CO2: 25 meq/L (ref 19–32)
Calcium: 9.7 mg/dL (ref 8.4–10.5)
Chloride: 105 mEq/L (ref 96–112)
Creat: 1.06 mg/dL (ref 0.50–1.35)
GLUCOSE: 104 mg/dL — AB (ref 70–99)
Potassium: 4.8 mEq/L (ref 3.5–5.3)
Sodium: 141 mEq/L (ref 135–145)
Total Bilirubin: 0.6 mg/dL (ref 0.2–1.2)
Total Protein: 6.5 g/dL (ref 6.0–8.3)

## 2013-07-28 LAB — CBC
HCT: 35.7 % — ABNORMAL LOW (ref 39.0–52.0)
Hemoglobin: 12.1 g/dL — ABNORMAL LOW (ref 13.0–17.0)
MCH: 33.2 pg (ref 26.0–34.0)
MCHC: 33.9 g/dL (ref 30.0–36.0)
MCV: 98.1 fL (ref 78.0–100.0)
Platelets: 293 10*3/uL (ref 150–400)
RBC: 3.64 MIL/uL — AB (ref 4.22–5.81)
RDW: 15.1 % (ref 11.5–15.5)
WBC: 10.1 10*3/uL (ref 4.0–10.5)

## 2013-07-28 MED ORDER — RANOLAZINE ER 500 MG PO TB12: 500.0000 mg | ORAL_TABLET | Freq: Two times a day (BID) | ORAL | Status: DC

## 2013-07-28 MED ORDER — ISOSORBIDE MONONITRATE ER 30 MG PO TB24: 6.0000 mg | ORAL_TABLET | Freq: Every day | ORAL | Status: DC

## 2013-07-28 NOTE — Patient Instructions (Addendum)
Your physician has requested that you have a cardiac catheterization. Cardiac catheterization is used to diagnose and/or treat various heart conditions. Doctors may recommend this procedure for a number of different reasons. The most common reason is to evaluate chest pain. Chest pain can be a symptom of coronary artery disease (CAD), and cardiac catheterization can show whether plaque is narrowing or blocking your heart's arteries. This procedure is also used to evaluate the valves, as well as measure the blood flow and oxygen levels in different parts of your heart. For further information please visit [email or web address] Please follow instruction sheet, as given. This will be scheduled on Wednesday June 17 th.  Your physician recommends that you return for lab work today.  Your physician has recommended you make the following change in your medication: start new prescription for isorsorbide and ranrexa. Take the ranexa sample 1 tablet twice daily. The isosorbide prescription has been sent to your pharmacy.  A chest x-ray takes a picture of the organs and structures inside the chest, including the heart, lungs, and blood vessels. This test can show several things, including, whether the heart is enlarges; whether fluid is building up in the lungs; and whether pacemaker / defibrillator leads are still in place.  Your physician recommends that you schedule a follow-up appointment with a Dr. Tresa Endo or [facility]  2 weeks following your procedure. The hospital will make these arrangements at the time of your discharge.

## 2013-07-28 NOTE — Progress Notes (Signed)
Patient ID: [name], male   DOB: 1940/04/28, 73 y.o.   MRN: [id number]       HPI: [name] is a 73 y.o. male who presents to the office for cardiology evaluation with a chief complaint of recurrent episodes of chest discomfort.  [name]  has established coronary artery disease dating back to 1997 as is s/p CABGx5 revascularization surgery. In 2006 he underwent stenting of the midportion of the graft to the right coronary artery and cutting balloon to the LAD after the LIMA insertion. On New Year's Eve 2011 he developed an acute coronary syndrome was found to have a new subtotal stenosis in the graft to right coronary artery more distal to the prior stent and also had in-stent narrowing to the proximal portion of the previously placed stent. He underwent successful intervention with a 3.5x18 mm stent. His LIMA to the LAD was patent. The graft to the circumflex was occluded. He had 90% stenosis in a small marginal vessel. He does have a history of hypertension, type 2 diabetes mellitus, as well as hyperlipidemia. His last nuclear perfusion study was in February 2014 which continued to show normal perfusion without scar or ischemia. An echo Doppler study at that time showed an ejection fraction of 55-60% with mild aortic sclerosis without stenosis. He had mild pulmonary hypertension with a PA pressure 32 mm.  In August 2014, he developed a presyncopal spell after he'd been dragging and unloading logs from a stroller. He apparently was evaluated in the emergency room. Head CT did not show acute abnormalities although did suggest small vessel disease. Chest x-ray was normal. Cardiac enzymes were negative. Hemoglobin was 13.6 hematocrit 37.3. Renal function revealed a BUN of 25 currently 1.13. I saw him in the office several days later and felt that his symptoms occurred in the setting of mild dehydration with significant straining possibly inducing a vagal event while he was dragging heavy  logs.   He does have a history of Barrett's esophagus. Recently, he has developed episodes of chest pain typically after he eats. This has been associated with a sensation of increased gas. He feels symptoms radiating from his stomach into his esophagus. Recently, he started taking over-the-counter probiotics and some of his abdominal bloating and gas has significantly improved. He denies recent chest pressure that is similar to his prior angina. He denies any change in shortness of breath development. He recently saw the PA for Dr[facility]ce [facility]. Because of this chest pain history it was recommended that he see me prior to planned endoscopy when I saw him in November 2014.  He did undergo his procedure. He tolerated it well. He also tells me he is being cared for by the by Dr. Dierdre Forth for his arthritis. He denies any exertional chest pain. He denies shortness of breath. He is unaware of palpitations. He does have diabetes. He has been taking his Bentyl prednisone for his Barrett's esophagus and GERD.  Over the last several months, [name] has noticed recurrent episodes of chest pain.  In he has tried [place] without benefit and most recently he has noticed that if he takes sublingual nitroglycerin.  His chest pain, often improves within 1 minute.  He has had frequent episodes of chest pain with activity and recently has occurred with increased frequency and almost daily.  He denies any prolonged episode of substernal chest tightness.  He denies any nocturnal symptoms.  He presents for evaluation.  Past Medical History  Diagnosis Date  . Coronary artery disease   . Hypertension   . Hyperlipidemia   . Diabetes   . Heart murmur   . Chronic kidney disease   . Myocardial infarction   . GERD (gastroesophageal reflux disease)   . Arthritis     Past Surgical History  Procedure Laterality Date  . Coronary angioplasty with stent placement    . Coronary artery bypass graft    . Joint replacement        Allergies  Allergen Reactions  . Ivp Dye [Iodinated Diagnostic Agents] Other (See Comments)    Increased blood pressure, heart rate    Current Outpatient Prescriptions  Medication Sig Dispense Refill  . amLODipine (NORVASC) 10 MG tablet Take 10 mg by mouth daily.      Marland Kitchen aspirin EC 81 MG tablet Take 81 mg by mouth at bedtime.       . folic acid (FOLVITE) 1 MG tablet Take 1 mg by mouth daily with lunch.       . glimepiride (AMARYL) 1 MG tablet Take 1 mg by mouth daily.       Marland Kitchen losartan (COZAAR) 50 MG tablet Take 50 mg by mouth 2 (two) times daily.       . methotrexate (RHEUMATREX) 2.5 MG tablet Take 15 mg by mouth once a week. Takes 6 tablets (15 mg) on Saturday      . nitroGLYCERIN (NITROSTAT) 0.4 MG SL tablet Place 1 tablet (0.4 mg total) under the tongue every 5 (five) minutes as needed for chest pain.  25 tablet  3  . predniSONE (DELTASONE) 1 MG tablet Take 4 mg by mouth at bedtime.       . carvedilol (COREG) 25 MG tablet Take 25 mg by mouth 2 (two) times daily with a meal.      . clopidogrel (PLAVIX) 75 MG tablet Take 75 mg by mouth daily with lunch.      . isosorbide mononitrate (IMDUR) 30 MG 24 hr tablet Take 0.5 tablets (15 mg total) by mouth daily.  30 tablet  6  . metFORMIN (GLUCOPHAGE) 1000 MG tablet Take 1,000 mg by mouth 2 (two) times daily with a meal.      . pantoprazole (PROTONIX) 40 MG tablet Take 40 mg by mouth daily.      . predniSONE (DELTASONE) 5 MG tablet Take 5 mg by mouth daily with breakfast.      . ranolazine (RANEXA) 500 MG 12 hr tablet Take 1 tablet (500 mg total) by mouth 2 (two) times daily.  56 tablet  0   No current facility-administered medications for this visit.    History   Social History  . Marital Status: Married    Spouse Name: N/A    Number of Children: 1  . Years of Education: N/A   Occupational History  . Retired    Social History Main Topics  . Smoking status: Never Smoker   . Smokeless tobacco: Never Used  . Alcohol Use: Yes      Comment: 2-3 beers a month.  . Drug Use: No  . Sexual Activity: Not on file   Other Topics Concern  . Not on file   Social History Narrative  . No narrative on file   Social history notable that he is married he has one child. There is no tobacco use. He does not routinely exercise. He does drink alcohol infrequently.  Family History  Problem Relation Age of Onset  . Heart attack Brother   .  Heart disease Brother   . Diabetes Mother   . Heart disease Mother   . Hypertension Mother   . Cancer Father   . Diabetes Sister   . Hypertension Sister   . Heart disease Brother   . Diabetes Brother   . Colon cancer Neg Hx     ROS General: Negative; No fevers, chills, or night sweats;  HEENT: Negative; No changes in vision or hearing, sinus congestion, difficulty swallowing Pulmonary: Negative; No cough, wheezing, shortness of breath, hemoptysis Cardiovascular: See history of present illness GI: Positive for history of abdominal bloating No nausea, vomiting, diarrhea, or abdominal pain GU: Negative; No dysuria, hematuria, or difficulty voiding Musculoskeletal: Negative; no myalgias, joint pain, or weakness Hematologic/Oncology: Negative; no easy bruising, bleeding Endocrine: Positive for diabetes; no heat/cold intolerance; Neuro: Negative; no changes in balance, headaches Skin: Negative; No rashes or skin lesions Psychiatric: Negative; No behavioral problems, depression Sleep: Positive for sleep apnea No snoring, daytime sleepiness, hypersomnolence, bruxism, restless legs, hypnogognic hallucinations, no cataplexy Other comprehensive 14 point system review is negative.  PE BP 132/64  Pulse 64  Ht 5\' 11"  (1.803 m)  Wt 205 lb 3.2 oz (93.078 kg)  BMI 28.63 kg/m2  Repeat blood pressure by me was 160/80 General: Alert, oriented, no distress.  Skin: normal turgor, no rashes HEENT: Normocephalic, atraumatic. Pupils round and reactive; sclera anicteric;no lid lag. No  xanthelasmas. Nose without nasal septal hypertrophy Mouth/Parynx benign; Mallinpatti scale 3 Neck: No JVD, no carotid bruits with normal carotid upstroke Lungs: clear to ausculatation and percussion; no wheezing or rales Heart: RRR, s1 s2 normal 1-2/6 systolic murmur. No diastolic murmur. No rub thrills or heaves. No S3  Abdomen: Moderate central adiposity soft, nontender; no hepatosplenomehaly, BS+; abdominal aorta nontender and not dilated by palpation. Back: No CVA tenderness Pulses 2+ Extremities: no clubbing cyanosis or edema, Homan's sign negative  Neurologic: grossly nonfocal Psychologic: Normal affect and mood  ECG (independently read by me): Normal sinus rhythm at 64 beats per minute.  Inferior lateral T-wave abnormality  Prior February 2015 ECG (independently read by me): Sinus rhythm with inferolateral ST-T changes, unchanged  Prior ECG of 01/03/2013: Sinus rhythm at 82 beats per minute with previously noted the inferolateral T-wave abnormalities; however these T changes appear slightly more prominent than his last ECG in are diffuse. Probable LVH with repolarization changes; incomplete right bundle branch block  LABS:  BMET    Component Value Date/Time   NA 138 04/28/2013 0944   K 4.3 04/28/2013 0944   CL 103 04/28/2013 0944   CO2 27 04/28/2013 0944   GLUCOSE 105* 04/28/2013 0944   BUN 27* 04/28/2013 0944   CREATININE 0.81 04/28/2013 0944   CREATININE 1.13 10/08/2012 1314   CALCIUM 9.7 04/28/2013 0944   GFRNONAA 63* 10/08/2012 1314   GFRAA 73* 10/08/2012 1314     Hepatic Function Panel     Component Value Date/Time   PROT 6.8 12/19/2012 2116   ALBUMIN 3.5 12/19/2012 2116   AST 16 12/19/2012 2116   ALT 13 12/19/2012 2116   ALKPHOS 78 12/19/2012 2116   BILITOT 0.5 12/19/2012 2116     CBC    Component Value Date/Time   WBC 7.9 04/28/2013 0944   WBC 6.6 01/28/2013 0943   RBC 4.04* 04/28/2013 0944   RBC 5.05 01/28/2013 0943   HGB 12.4* 04/28/2013 0944   HGB 15.4  01/28/2013 0943   HCT 36.4* 04/28/2013 0944   HCT 49.6 01/28/2013 0943   PLT 314 04/28/2013 0944  MCV 90.1 04/28/2013 0944   MCV 98.3* 01/28/2013 0943   MCH 30.7 04/28/2013 0944   MCH 30.5 01/28/2013 0943   MCHC 34.1 04/28/2013 0944   MCHC 31.0* 01/28/2013 0943   RDW 15.9* 04/28/2013 0944   LYMPHSABS 2.6 02/12/2009 1520   MONOABS 0.8 02/12/2009 1520   EOSABS 0.2 02/12/2009 1520   BASOSABS 0.0 02/12/2009 1520     BNP    Component Value Date/Time   PROBNP <30.0 02/12/2009 1937    Lipid Panel     Component Value Date/Time   CHOL  Value: 133        ATP III CLASSIFICATION:  <200     mg/dL   Desirable  161-096200-239  mg/dL   Borderline High  >=045>=240    mg/dL   High        4/0/98111/02/2009 0146   TRIG 152* 02/13/2009 0146   HDL 35* 02/13/2009 0146   CHOLHDL 3.8 02/13/2009 0146   VLDL 30 02/13/2009 0146   LDLCALC  Value: 68        Total Cholesterol/HDL:CHD Risk Coronary Heart Disease Risk Table                     Men   Women  1/2 Average Risk   3.4   3.3  Average Risk       5.0   4.4  2 X Average Risk   9.6   7.1  3 X Average Risk  23.4   11.0        Use the calculated Patient Ratio above and the CHD Risk Table to determine the patient's CHD Risk.        ATP III CLASSIFICATION (LDL):  <100     mg/dL   Optimal  914-782100-129  mg/dL   Near or Above                    Optimal  130-159  mg/dL   Borderline  956-213160-189  mg/dL   High  >086>190     mg/dL   Very High 5/7/84691/02/2009 [id number]     RADIOLOGY: Dg Chest 2 View  10/08/2012   CLINICAL DATA:  Chest pain  EXAM: CHEST  2 VIEW  COMPARISON:  02/12/2009.  FINDINGS: Mild hyperinflation of the lungs. Prior CABG. Heart and mediastinal contours are within normal limits. No focal opacities or effusions. No acute bony abnormality.  IMPRESSION: No active cardiopulmonary disease.   Electronically Signed   By: Charlett NoseKevin  [place]   On: 10/08/2012 14:55   Ct Head Wo Contrast  10/08/2012   *RADIOLOGY REPORT*  Clinical Data: Near-syncope  CT HEAD WITHOUT CONTRAST  Technique:  Contiguous axial images were  obtained from the base of the skull through the vertex without contrast.  Comparison: 05/06/2007  Findings: No evidence of parenchymal hemorrhage or extra-axial fluid collection. No mass lesion, mass effect, or midline shift.  No CT evidence of acute infarction.  Subcortical white matter and periventricular small vessel ischemic changes.  Intracranial atherosclerosis.  Mild global cortical atrophy, likely age appropriate.  No ventriculomegaly.  The visualized paranasal sinuses are essentially clear. The mastoid air cells are unopacified.  No evidence of calvarial fracture.  IMPRESSION: No evidence of acute intracranial abnormality.  Mild atrophy with small vessel ischemic changes and intracranial atherosclerosis.   Original Report Authenticated By: Charline BillsSriyesh Krishnan, M.D.      ASSESSMENT AND PLAN:  Mr. [name] is a 73 year old white male who is status post CABG surgery in 1997  and has undergone several interventions in 2006 his last being in 2011.  He has a documented occluded graft to his circumflex vessel and has undergone placement of several stents into his RCA graft in the mid and more distal portion of the body of the graft.  He is documented total mid LAD occlusion with 70% diagonal stenosis and a patent LIMA graft, which had supplied the mid LAD, but with mild narrowing beyond the LIMA insertion.  The cervix marginal had 90% stenosis.  He now presents with increasing episodes of chest pain, which seemed to be nitrate responsive.  I had a long discussion with both he and his wife.  It is my recommendation that definitive cardiac catheterization be performed.  I am adding Imdur 30 mg to his medical regimen and also provided him with samples of Nexium 500 mg twice a day.  Laboratory will be obtained today since he is in the fasting state.  I discussed the risk benefits of the catheterization procedure and potential need for intervention if necessary.  Catheterization will be scheduled to be done in 2 days  at [facility].   Lennette Bihari, MD, [facility]  07/28/2013 7:16 PM

## 2013-07-29 ENCOUNTER — Encounter: Payer: Self-pay | Admitting: Cardiology

## 2013-07-29 ENCOUNTER — Other Ambulatory Visit: Payer: Self-pay | Admitting: *Deleted

## 2013-07-29 DIAGNOSIS — Z01818 Encounter for other preprocedural examination: Secondary | ICD-10-CM

## 2013-07-29 LAB — APTT: APTT: 26 s (ref 24–37)

## 2013-07-29 LAB — PROTIME-INR
INR: 1.04 (ref <1.50)
Prothrombin Time: 13.5 seconds (ref 11.6–15.2)

## 2013-07-29 MED ORDER — DIAZEPAM 5 MG PO TABS: 5.0000 mg | ORAL_TABLET | ORAL | Status: AC

## 2013-07-30 ENCOUNTER — Ambulatory Visit (HOSPITAL_COMMUNITY)
Admission: RE | Admit: 2013-07-30 | Discharge: 2013-07-31 | Disposition: A | Discharge status: Home / Self Care | Payer: Medicare Other | Source: Ambulatory Visit | Attending: Cardiovascular Disease | Admitting: Cardiovascular Disease

## 2013-07-30 ENCOUNTER — Encounter (HOSPITAL_COMMUNITY)
Admission: RE | Disposition: A | Discharge status: Home / Self Care | Payer: Medicare Other | Source: Ambulatory Visit | Attending: Cardiovascular Disease

## 2013-07-30 ENCOUNTER — Encounter (HOSPITAL_COMMUNITY): Payer: Self-pay | Admitting: General Practice

## 2013-07-30 DIAGNOSIS — Y840 Cardiac catheterization as the cause of abnormal reaction of the patient, or of later complication, without mention of misadventure at the time of the procedure: Secondary | ICD-10-CM | POA: Insufficient documentation

## 2013-07-30 DIAGNOSIS — N189 Chronic kidney disease, unspecified: Secondary | ICD-10-CM | POA: Insufficient documentation

## 2013-07-30 DIAGNOSIS — I25810 Atherosclerosis of coronary artery bypass graft(s) without angina pectoris: Secondary | ICD-10-CM | POA: Insufficient documentation

## 2013-07-30 DIAGNOSIS — Z7902 Long term (current) use of antithrombotics/antiplatelets: Secondary | ICD-10-CM | POA: Insufficient documentation

## 2013-07-30 DIAGNOSIS — Z01818 Encounter for other preprocedural examination: Secondary | ICD-10-CM

## 2013-07-30 DIAGNOSIS — I200 Unstable angina: Secondary | ICD-10-CM | POA: Diagnosis present

## 2013-07-30 DIAGNOSIS — Z8679 Personal history of other diseases of the circulatory system: Secondary | ICD-10-CM

## 2013-07-30 DIAGNOSIS — E119 Type 2 diabetes mellitus without complications: Secondary | ICD-10-CM | POA: Diagnosis present

## 2013-07-30 DIAGNOSIS — Z955 Presence of coronary angioplasty implant and graft: Secondary | ICD-10-CM

## 2013-07-30 DIAGNOSIS — K2270 Barrett's esophagus without dysplasia: Secondary | ICD-10-CM | POA: Insufficient documentation

## 2013-07-30 DIAGNOSIS — Z9861 Coronary angioplasty status: Secondary | ICD-10-CM | POA: Insufficient documentation

## 2013-07-30 DIAGNOSIS — D649 Anemia, unspecified: Secondary | ICD-10-CM | POA: Diagnosis present

## 2013-07-30 DIAGNOSIS — I209 Angina pectoris, unspecified: Secondary | ICD-10-CM | POA: Insufficient documentation

## 2013-07-30 DIAGNOSIS — I252 Old myocardial infarction: Secondary | ICD-10-CM | POA: Insufficient documentation

## 2013-07-30 DIAGNOSIS — I119 Hypertensive heart disease without heart failure: Secondary | ICD-10-CM | POA: Diagnosis present

## 2013-07-30 DIAGNOSIS — I129 Hypertensive chronic kidney disease with stage 1 through stage 4 chronic kidney disease, or unspecified chronic kidney disease: Secondary | ICD-10-CM | POA: Insufficient documentation

## 2013-07-30 DIAGNOSIS — K219 Gastro-esophageal reflux disease without esophagitis: Secondary | ICD-10-CM | POA: Insufficient documentation

## 2013-07-30 DIAGNOSIS — I2510 Atherosclerotic heart disease of native coronary artery without angina pectoris: Secondary | ICD-10-CM | POA: Diagnosis present

## 2013-07-30 DIAGNOSIS — E785 Hyperlipidemia, unspecified: Secondary | ICD-10-CM | POA: Diagnosis present

## 2013-07-30 DIAGNOSIS — Z7982 Long term (current) use of aspirin: Secondary | ICD-10-CM | POA: Insufficient documentation

## 2013-07-30 DIAGNOSIS — I359 Nonrheumatic aortic valve disorder, unspecified: Secondary | ICD-10-CM | POA: Insufficient documentation

## 2013-07-30 HISTORY — PX: CORONARY STENT PLACEMENT: SHX1402

## 2013-07-30 HISTORY — PX: PERCUTANEOUS CORONARY INTERVENTION-BALLOON ONLY: SHX6014

## 2013-07-30 HISTORY — PX: PERCUTANEOUS CORONARY STENT INTERVENTION (PCI-S): SHX5485

## 2013-07-30 HISTORY — PX: LEFT HEART CATHETERIZATION WITH CORONARY/GRAFT ANGIOGRAM: SHX5450

## 2013-07-30 HISTORY — DX: Barrett's esophagus without dysplasia: K22.70

## 2013-07-30 HISTORY — DX: Syncope and collapse: R55

## 2013-07-30 HISTORY — DX: Sleep apnea, unspecified: G47.30

## 2013-07-30 LAB — GLUCOSE, CAPILLARY
GLUCOSE-CAPILLARY: 117 mg/dL — AB (ref 70–99)
GLUCOSE-CAPILLARY: 128 mg/dL — AB (ref 70–99)
Glucose-Capillary: 124 mg/dL — ABNORMAL HIGH (ref 70–99)
Glucose-Capillary: 242 mg/dL — ABNORMAL HIGH (ref 70–99)

## 2013-07-30 LAB — POCT ACTIVATED CLOTTING TIME: Activated Clotting Time: 348 seconds

## 2013-07-30 SURGERY — LEFT HEART CATHETERIZATION WITH CORONARY/GRAFT ANGIOGRAM
Anesthesia: LOCAL

## 2013-07-30 MED ORDER — SODIUM CHLORIDE 0.9 % IV SOLN
0.2500 mg/kg/h | INTRAVENOUS | Status: DC
  Filled 2013-07-30: qty 250

## 2013-07-30 MED ORDER — NITROGLYCERIN 0.2 MG/ML ON CALL CATH LAB
INTRAVENOUS | Status: AC
  Filled 2013-07-30: qty 1

## 2013-07-30 MED ORDER — CARVEDILOL 25 MG PO TABS
25.0000 mg | ORAL_TABLET | Freq: Two times a day (BID) | ORAL | Status: DC
  Administered 2013-07-30 – 2013-07-31 (×2): 25 mg via ORAL
  Filled 2013-07-30 (×4): qty 1

## 2013-07-30 MED ORDER — ISOSORBIDE MONONITRATE ER 30 MG PO TB24
30.0000 mg | ORAL_TABLET | Freq: Every day | ORAL | Status: DC
  Administered 2013-07-30 – 2013-07-31 (×2): 30 mg via ORAL
  Filled 2013-07-30 (×2): qty 1

## 2013-07-30 MED ORDER — MIDAZOLAM HCL 2 MG/2ML IJ SOLN
INTRAMUSCULAR | Status: AC
  Filled 2013-07-30: qty 2

## 2013-07-30 MED ORDER — CLOPIDOGREL BISULFATE 300 MG PO TABS
ORAL_TABLET | ORAL | Status: AC
  Filled 2013-07-30: qty 1

## 2013-07-30 MED ORDER — ASPIRIN EC 81 MG PO TBEC
81.0000 mg | DELAYED_RELEASE_TABLET | Freq: Every day | ORAL | Status: DC
  Administered 2013-07-31: 10:00:00 81 mg via ORAL
  Filled 2013-07-30: qty 1

## 2013-07-30 MED ORDER — SODIUM CHLORIDE 0.9 % IV SOLN: 250.0000 mL | INTRAVENOUS | Status: DC | PRN

## 2013-07-30 MED ORDER — SODIUM CHLORIDE 0.9 % IJ SOLN
3.0000 mL | INTRAMUSCULAR | Status: DC | PRN
Start: 2013-07-30 — End: 2013-07-30

## 2013-07-30 MED ORDER — FENTANYL CITRATE 0.05 MG/ML IJ SOLN
INTRAMUSCULAR | Status: AC
  Filled 2013-07-30: qty 2

## 2013-07-30 MED ORDER — RANOLAZINE ER 500 MG PO TB12
500.0000 mg | ORAL_TABLET | Freq: Two times a day (BID) | ORAL | Status: DC
  Administered 2013-07-30 – 2013-07-31 (×3): 500 mg via ORAL
  Filled 2013-07-30 (×4): qty 1

## 2013-07-30 MED ORDER — ASPIRIN 81 MG PO CHEW
81.0000 mg | CHEWABLE_TABLET | ORAL | Status: AC
  Administered 2013-07-30: 81 mg via ORAL
  Filled 2013-07-30: qty 1

## 2013-07-30 MED ORDER — SODIUM CHLORIDE 0.9 % IV SOLN
INTRAVENOUS | Status: DC
  Administered 2013-07-30: 07:00:00 via INTRAVENOUS

## 2013-07-30 MED ORDER — LIDOCAINE HCL (PF) 1 % IJ SOLN
INTRAMUSCULAR | Status: AC
  Filled 2013-07-30: qty 30

## 2013-07-30 MED ORDER — CLOPIDOGREL BISULFATE 75 MG PO TABS: 75.0000 mg | ORAL_TABLET | Freq: Every day | ORAL | Status: DC

## 2013-07-30 MED ORDER — BIVALIRUDIN 250 MG IV SOLR
INTRAVENOUS | Status: AC
  Filled 2013-07-30: qty 250

## 2013-07-30 MED ORDER — HEPARIN (PORCINE) IN NACL 2-0.9 UNIT/ML-% IJ SOLN
INTRAMUSCULAR | Status: AC
  Filled 2013-07-30: qty 1000

## 2013-07-30 MED ORDER — SODIUM CHLORIDE 0.9 % IV SOLN
INTRAVENOUS | Status: DC
  Administered 2013-07-30: 19:00:00 via INTRAVENOUS

## 2013-07-30 MED ORDER — SODIUM CHLORIDE 0.9 % IV SOLN
1.7500 mg/kg/h | INTRAVENOUS | Status: DC
  Filled 2013-07-30: qty 250

## 2013-07-30 MED ORDER — ASPIRIN EC 81 MG PO TBEC
81.0000 mg | DELAYED_RELEASE_TABLET | Freq: Every day | ORAL | Status: DC
  Filled 2013-07-30: qty 1

## 2013-07-30 MED ORDER — AMLODIPINE BESYLATE 5 MG PO TABS
5.0000 mg | ORAL_TABLET | Freq: Every day | ORAL | Status: DC
  Administered 2013-07-30 – 2013-07-31 (×2): 5 mg via ORAL
  Filled 2013-07-30 (×2): qty 1

## 2013-07-30 MED ORDER — ACETAMINOPHEN 325 MG PO TABS
650.0000 mg | ORAL_TABLET | ORAL | Status: DC | PRN
  Administered 2013-07-30: 17:00:00 650 mg via ORAL
  Filled 2013-07-30: qty 2

## 2013-07-30 MED ORDER — SODIUM CHLORIDE 0.9 % IJ SOLN: 3.0000 mL | Freq: Two times a day (BID) | INTRAMUSCULAR | Status: DC

## 2013-07-30 NOTE — Interval H&P Note (Signed)
Cath Lab Visit (complete for each Cath Lab visit)  Clinical Evaluation Leading to the Procedure:   ACS: no  Non-ACS:    Anginal Classification: CCS III  Anti-ischemic medical therapy: Maximal Therapy (2 or more classes of medications)  Non-Invasive Test Results: No non-invasive testing performed  Prior CABG: Previous CABG      History and Physical Interval Note:  07/30/2013 9:00 AM  [name]  has presented today for surgery, with the diagnosis of cp  The various methods of treatment have been discussed with the patient and family. After consideration of risks, benefits and other options for treatment, the patient has consented to  Procedure(s): LEFT HEART CATHETERIZATION WITH CORONARY/GRAFT ANGIOGRAM (N/A) as a surgical intervention .  The patient's history has been reviewed, patient examined, no change in status, stable for surgery.  I have reviewed the patient's chart and labs.  Questions were answered to the patient's satisfaction.     KELLY,THOMAS A

## 2013-07-30 NOTE — H&P (View-Only) (Signed)
Patient ID: [name], male   DOB: 1940/04/28, 73 y.o.   MRN: [id number]       HPI: [name] is a 73 y.o. male who presents to the office for cardiology evaluation with a chief complaint of recurrent episodes of chest discomfort.  [name]  has established coronary artery disease dating back to 1997 as is s/p CABGx5 revascularization surgery. In 2006 he underwent stenting of the midportion of the graft to the right coronary artery and cutting balloon to the LAD after the LIMA insertion. On New Year's Eve 2011 he developed an acute coronary syndrome was found to have a new subtotal stenosis in the graft to right coronary artery more distal to the prior stent and also had in-stent narrowing to the proximal portion of the previously placed stent. He underwent successful intervention with a 3.5x18 mm stent. His LIMA to the LAD was patent. The graft to the circumflex was occluded. He had 90% stenosis in a small marginal vessel. He does have a history of hypertension, type 2 diabetes mellitus, as well as hyperlipidemia. His last nuclear perfusion study was in February 2014 which continued to show normal perfusion without scar or ischemia. An echo Doppler study at that time showed an ejection fraction of 55-60% with mild aortic sclerosis without stenosis. He had mild pulmonary hypertension with a PA pressure 32 mm.  In August 2014, he developed a presyncopal spell after he'd been dragging and unloading logs from a stroller. He apparently was evaluated in the emergency room. Head CT did not show acute abnormalities although did suggest small vessel disease. Chest x-ray was normal. Cardiac enzymes were negative. Hemoglobin was 13.6 hematocrit 37.3. Renal function revealed a BUN of 25 currently 1.13. I saw him in the office several days later and felt that his symptoms occurred in the setting of mild dehydration with significant straining possibly inducing a vagal event while he was dragging heavy  logs.   He does have a history of Barrett's esophagus. Recently, he has developed episodes of chest pain typically after he eats. This has been associated with a sensation of increased gas. He feels symptoms radiating from his stomach into his esophagus. Recently, he started taking over-the-counter probiotics and some of his abdominal bloating and gas has significantly improved. He denies recent chest pressure that is similar to his prior angina. He denies any change in shortness of breath development. He recently saw the [facility] for Dr[facility]. Because of this chest pain history it was recommended that he see me prior to planned endoscopy when I saw him in November 2014.  He did undergo his procedure. He tolerated it well. He also tells me he is being cared for by the by Dr. Dierdre Forth for his arthritis. He denies any exertional chest pain. He denies shortness of breath. He is unaware of palpitations. He does have diabetes. He has been taking his Bentyl prednisone for his Barrett's esophagus and GERD.  Over the last several months, [name] has noticed recurrent episodes of chest pain.  In he has tried [place] without benefit and most recently he has noticed that if he takes sublingual nitroglycerin.  His chest pain, often improves within 1 minute.  He has had frequent episodes of chest pain with activity and recently has occurred with increased frequency and almost daily.  He denies any prolonged episode of substernal chest tightness.  He denies any nocturnal symptoms.  He presents for evaluation.  Past Medical History  Diagnosis Date  . Coronary artery disease   . Hypertension   . Hyperlipidemia   . Diabetes   . Heart murmur   . Chronic kidney disease   . Myocardial infarction   . GERD (gastroesophageal reflux disease)   . Arthritis     Past Surgical History  Procedure Laterality Date  . Coronary angioplasty with stent placement    . Coronary artery bypass graft    . Joint replacement        Allergies  Allergen Reactions  . Ivp Dye [Iodinated Diagnostic Agents] Other (See Comments)    Increased blood pressure, heart rate    Current Outpatient Prescriptions  Medication Sig Dispense Refill  . amLODipine (NORVASC) 10 MG tablet Take 10 mg by mouth daily.      Marland Kitchen aspirin EC 81 MG tablet Take 81 mg by mouth at bedtime.       . folic acid (FOLVITE) 1 MG tablet Take 1 mg by mouth daily with lunch.       . glimepiride (AMARYL) 1 MG tablet Take 1 mg by mouth daily.       Marland Kitchen losartan (COZAAR) 50 MG tablet Take 50 mg by mouth 2 (two) times daily.       . methotrexate (RHEUMATREX) 2.5 MG tablet Take 15 mg by mouth once a week. Takes 6 tablets (15 mg) on Saturday      . nitroGLYCERIN (NITROSTAT) 0.4 MG SL tablet Place 1 tablet (0.4 mg total) under the tongue every 5 (five) minutes as needed for chest pain.  25 tablet  3  . predniSONE (DELTASONE) 1 MG tablet Take 4 mg by mouth at bedtime.       . carvedilol (COREG) 25 MG tablet Take 25 mg by mouth 2 (two) times daily with a meal.      . clopidogrel (PLAVIX) 75 MG tablet Take 75 mg by mouth daily with lunch.      . isosorbide mononitrate (IMDUR) 30 MG 24 hr tablet Take 0.5 tablets (15 mg total) by mouth daily.  30 tablet  6  . metFORMIN (GLUCOPHAGE) 1000 MG tablet Take 1,000 mg by mouth 2 (two) times daily with a meal.      . pantoprazole (PROTONIX) 40 MG tablet Take 40 mg by mouth daily.      . predniSONE (DELTASONE) 5 MG tablet Take 5 mg by mouth daily with breakfast.      . ranolazine (RANEXA) 500 MG 12 hr tablet Take 1 tablet (500 mg total) by mouth 2 (two) times daily.  56 tablet  0   No current facility-administered medications for this visit.    History   Social History  . Marital Status: Married    Spouse Name: N/A    Number of Children: 1  . Years of Education: N/A   Occupational History  . Retired    Social History Main Topics  . Smoking status: Never Smoker   . Smokeless tobacco: Never Used  . Alcohol Use: Yes      Comment: 2-3 beers a month.  . Drug Use: No  . Sexual Activity: Not on file   Other Topics Concern  . Not on file   Social History Narrative  . No narrative on file   Social history notable that he is married he has one child. There is no tobacco use. He does not routinely exercise. He does drink alcohol infrequently.  Family History  Problem Relation Age of Onset  . Heart attack Brother   .  Heart disease Brother   . Diabetes Mother   . Heart disease Mother   . Hypertension Mother   . Cancer Father   . Diabetes Sister   . Hypertension Sister   . Heart disease Brother   . Diabetes Brother   . Colon cancer Neg Hx     ROS General: Negative; No fevers, chills, or night sweats;  HEENT: Negative; No changes in vision or hearing, sinus congestion, difficulty swallowing Pulmonary: Negative; No cough, wheezing, shortness of breath, hemoptysis Cardiovascular: See history of present illness GI: Positive for history of abdominal bloating No nausea, vomiting, diarrhea, or abdominal pain GU: Negative; No dysuria, hematuria, or difficulty voiding Musculoskeletal: Negative; no myalgias, joint pain, or weakness Hematologic/Oncology: Negative; no easy bruising, bleeding Endocrine: Positive for diabetes; no heat/cold intolerance; Neuro: Negative; no changes in balance, headaches Skin: Negative; No rashes or skin lesions Psychiatric: Negative; No behavioral problems, depression Sleep: Positive for sleep apnea No snoring, daytime sleepiness, hypersomnolence, bruxism, restless legs, hypnogognic hallucinations, no cataplexy Other comprehensive 14 point system review is negative.  PE BP 132/64  Pulse 64  Ht 5\' 11"  (1.803 m)  Wt 205 lb 3.2 oz (93.078 kg)  BMI 28.63 kg/m2  Repeat blood pressure by me was 160/80 General: Alert, oriented, no distress.  Skin: normal turgor, no rashes HEENT: Normocephalic, atraumatic. Pupils round and reactive; sclera anicteric;no lid lag. No  xanthelasmas. Nose without nasal septal hypertrophy Mouth/Parynx benign; Mallinpatti scale 3 Neck: No JVD, no carotid bruits with normal carotid upstroke Lungs: clear to ausculatation and percussion; no wheezing or rales Heart: RRR, s1 s2 normal 1-2/6 systolic murmur. No diastolic murmur. No rub thrills or heaves. No S3  Abdomen: Moderate central adiposity soft, nontender; no hepatosplenomehaly, BS+; abdominal aorta nontender and not dilated by palpation. Back: No CVA tenderness Pulses 2+ Extremities: no clubbing cyanosis or edema, Homan's sign negative  Neurologic: grossly nonfocal Psychologic: Normal affect and mood  ECG (independently read by me): Normal sinus rhythm at 64 beats per minute.  Inferior lateral T-wave abnormality  Prior February 2015 ECG (independently read by me): Sinus rhythm with inferolateral ST-T changes, unchanged  Prior ECG of 01/03/2013: Sinus rhythm at 82 beats per minute with previously noted the inferolateral T-wave abnormalities; however these T changes appear slightly more prominent than his last ECG in are diffuse. Probable LVH with repolarization changes; incomplete right bundle branch block  LABS:  BMET    Component Value Date/Time   NA 138 04/28/2013 0944   K 4.3 04/28/2013 0944   CL 103 04/28/2013 0944   CO2 27 04/28/2013 0944   GLUCOSE 105* 04/28/2013 0944   BUN 27* 04/28/2013 0944   CREATININE 0.81 04/28/2013 0944   CREATININE 1.13 10/08/2012 1314   CALCIUM 9.7 04/28/2013 0944   GFRNONAA 63* 10/08/2012 1314   GFRAA 73* 10/08/2012 1314     Hepatic Function Panel     Component Value Date/Time   PROT 6.8 12/19/2012 2116   ALBUMIN 3.5 12/19/2012 2116   AST 16 12/19/2012 2116   ALT 13 12/19/2012 2116   ALKPHOS 78 12/19/2012 2116   BILITOT 0.5 12/19/2012 2116     CBC    Component Value Date/Time   WBC 7.9 04/28/2013 0944   WBC 6.6 01/28/2013 0943   RBC 4.04* 04/28/2013 0944   RBC 5.05 01/28/2013 0943   HGB 12.4* 04/28/2013 0944   HGB 15.4  01/28/2013 0943   HCT 36.4* 04/28/2013 0944   HCT 49.6 01/28/2013 0943   PLT 314 04/28/2013 0944  MCV 90.1 04/28/2013 0944   MCV 98.3* 01/28/2013 0943   MCH 30.7 04/28/2013 0944   MCH 30.5 01/28/2013 0943   MCHC 34.1 04/28/2013 0944   MCHC 31.0* 01/28/2013 0943   RDW 15.9* 04/28/2013 0944   LYMPHSABS 2.6 02/12/2009 1520   MONOABS 0.8 02/12/2009 1520   EOSABS 0.2 02/12/2009 1520   BASOSABS 0.0 02/12/2009 1520     BNP    Component Value Date/Time   PROBNP <30.0 02/12/2009 1937    Lipid Panel     Component Value Date/Time   CHOL  Value: 133        ATP III CLASSIFICATION:  <200     mg/dL   Desirable  200-239  mg/dL   Borderline High  >=240    mg/dL   High        02/13/2009 0146   TRIG 152* 02/13/2009 0146   HDL 35* 02/13/2009 0146   CHOLHDL 3.8 02/13/2009 0146   VLDL 30 02/13/2009 0146   LDLCALC  Value: 68        Total Cholesterol/HDL:CHD Risk Coronary Heart Disease Risk Table                     Men   Women  1/2 Average Risk   3.4   3.3  Average Risk       5.0   4.4  2 X Average Risk   9.6   7.1  3 X Average Risk  23.4   11.0        Use the calculated Patient Ratio above and the CHD Risk Table to determine the patient's CHD Risk.        ATP III CLASSIFICATION (LDL):  <100     mg/dL   Optimal  100-129  mg/dL   Near or Above                    Optimal  130-159  mg/dL   Borderline  160-189  mg/dL   High  >190     mg/dL   Very High 02/13/2009 0146     RADIOLOGY: Dg Chest 2 View  10/08/2012   CLINICAL DATA:  Chest pain  EXAM: CHEST  2 VIEW  COMPARISON:  02/12/2009.  FINDINGS: Mild hyperinflation of the lungs. Prior CABG. Heart and mediastinal contours are within normal limits. No focal opacities or effusions. No acute bony abnormality.  IMPRESSION: No active cardiopulmonary disease.   Electronically Signed   By: Kevin  Dover   On: 10/08/2012 14:55   Ct Head Wo Contrast  10/08/2012   *RADIOLOGY REPORT*  Clinical Data: Near-syncope  CT HEAD WITHOUT CONTRAST  Technique:  Contiguous axial images were  obtained from the base of the skull through the vertex without contrast.  Comparison: 05/06/2007  Findings: No evidence of parenchymal hemorrhage or extra-axial fluid collection. No mass lesion, mass effect, or midline shift.  No CT evidence of acute infarction.  Subcortical white matter and periventricular small vessel ischemic changes.  Intracranial atherosclerosis.  Mild global cortical atrophy, likely age appropriate.  No ventriculomegaly.  The visualized paranasal sinuses are essentially clear. The mastoid air cells are unopacified.  No evidence of calvarial fracture.  IMPRESSION: No evidence of acute intracranial abnormality.  Mild atrophy with small vessel ischemic changes and intracranial atherosclerosis.   Original Report Authenticated By: Sriyesh Krishnan, M.D.      ASSESSMENT AND PLAN:  [name] is a 72-year-old white male who is status post CABG surgery in 1997   and has undergone several interventions in 2006 his last being in 2011.  He has a documented occluded graft to his circumflex vessel and has undergone placement of several stents into his RCA graft in the mid and more distal portion of the body of the graft.  He is documented total mid LAD occlusion with 70% diagonal stenosis and a patent LIMA graft, which had supplied the mid LAD, but with mild narrowing beyond the LIMA insertion.  The cervix marginal had 90% stenosis.  He now presents with increasing episodes of chest pain, which seemed to be nitrate responsive.  I had a long discussion with both he and his wife.  It is my recommendation that definitive cardiac catheterization be performed.  I am adding Imdur 30 mg to his medical regimen and also provided him with samples of Nexium 500 mg twice a day.  Laboratory will be obtained today since he is in the fasting state.  I discussed the risk benefits of the catheterization procedure and potential need for intervention if necessary.  Catheterization will be scheduled to be done in 2 days  at [facility].   Lennette Bihari, MD, [facility]  07/28/2013 7:16 PM

## 2013-07-30 NOTE — Progress Notes (Signed)
Site area: right groin  Site Prior to Removal:  Level 0  Pressure Applied For 20 MINUTES    Minutes Beginning at 1440  Manual:   yes  Patient Status During Pull:  Decreased blood pressure and heart rate patient asymptomatic given bolus of of NS. Blood pressure returned to pre pull numbers  Post Pull Groin Site:  Level 0  Post Pull Instructions Given:  yes  Post Pull Pulses Present:  yes  Dressing Applied:  yes  Comments:  Pressure gauze dressing applied

## 2013-07-30 NOTE — Progress Notes (Signed)
ANTICOAGULATION CONSULT NOTE - Initial Consult  Pharmacy Consult for bivalirudin Indication: s/p cath  Allergies  Allergen Reactions  . Ivp Dye [Iodinated Diagnostic Agents] Other (See Comments)    Increased blood pressure, heart rate    Patient Measurements: Height: 5\' 11"  (180.3 cm) Weight: 204 lb (92.534 kg) IBW/kg (Calculated) : 75.3 Heparin Dosing Weight:   Vital Signs: Temp: 97.7 F (36.5 C) (06/17 1200) Temp src: Oral (06/17 1200) BP: 135/67 mmHg (06/17 1245) Pulse Rate: 66 (06/17 1245)  Labs:  Recent Labs  07/28/13 1236  HGB 12.1*  HCT 35.7*  PLT 293  APTT 26  LABPROT 13.5  INR 1.04  CREATININE 1.06    Estimated Creatinine Clearance: 72.2 ml/min (by C-G formula based on Cr of 1.06).   Medical History: Past Medical History  Diagnosis Date  . Coronary artery disease   . Hypertension   . Hyperlipidemia   . Diabetes   . Heart murmur   . Chronic kidney disease   . Myocardial infarction   . GERD (gastroesophageal reflux disease)   . Arthritis     Medications:  Scheduled:  . amLODipine  5 mg Oral Daily  . [START ON 07/31/2013] aspirin EC  81 mg Oral Daily  . carvedilol  25 mg Oral BID WC  . clopidogrel  75 mg Oral Q breakfast  . isosorbide mononitrate  30 mg Oral Daily  . ranolazine  500 mg Oral BID   Infusions:  . sodium chloride      Assessment: 73 yo male s/p cath will be on bivalirudin until current bag runs out. Goal of Therapy:   Monitor platelets by anticoagulation protocol: Yes   Plan:  1) Continue current bag of bivalirudin at 0.25 mg/kg/hr until out. 2) Pharmacy will sign off.   So, Tsz-Yin 07/30/2013,1:09 PM

## 2013-07-30 NOTE — CV Procedure (Signed)
[name] is a 73 y.o. male    [id number]  [id number] LOCATION:  FACILITY: [facility]  PHYSICIAN: Lennette Bihari, MD, [facility] Mar 04, 1940   DATE OF PROCEDURE:  07/30/2013    CARDIAC CATHETERIZATION/PERCUTANEOUS CORONARY INTERVENTION    HISTORY:    [name] is a 73 y.o. male who underwent CABG x 5 surgery in 1997.  In 2006 he underwent stenting of the midportion of the graft to the RCA and cutting balloon to the LAD after the LIMA insertion.  He developed an acute coronary syndrome on 02/12/2010 and a new stent was placed in the vein graft to the RCA beyond the initial stent and there was in-stent restenosis, for which both lesions were treated successfully.  He had an occluded graft to the circumflex vessel and a 90% stenosis of a small marginal vessel.  Additional problems include hypertension, type 2 diabetes mellitus, and hyperlipidemia.  He recently has developed class III to class IV angina symptoms.  He was seen in the office 2 days ago, Ranexa, and isosorbide mononitrate were added to his medical regimen.  He presents now for definitive cardiac catheterization.   PROCEDURE: Left heart cardiac catheterization; coronary angiography, selective angiography into 2 saphenous vein grafts, selective angiography into the left internal mammary artery, left ventriculography, and complex percutaneous coronary intervention involving the saphenous vein graft to the RCA and distal native RCA involving 4 sites with angiosculpt, PTCA, and stenting of the proximal vein graft and restenotic lesion in the proximal stent, angiosculpt cutting balloon, and PTCA of the mid in-stent restenosis in the vein graft, angioscope, and stenting of the distal graft anastomosis site, extending into the native RCA; and PTCA with attempted cutting balloon and attempted stenting of the distal RCA PLA vessel  The patient was brought to the cath lab in the postabsorptive state. He  was premedicated with Versed 2 mg and  fentanyl 25 mcg. His right groin was prepped and shaved in usual sterile fashion. Xylocaine 1% was used for local anesthesia. A 5 French sheath was inserted into the R femoral artery. Diagnostic catheterizatiion was done with 5 [place] LF4, FR4, and pigtail catheters.  The right catheter was used for selective angiography into the vein graft, which I supplied the circumflex vessel and the vein graft supplying the RCA.  A LIMA catheter was necessary for selective angiography into the left internal mammary artery.  Left ventriculography was done with 25 cc Omnipaque contrast.   With the demonstration of significant progressive disease in the vein graft supplying the RCA and diseased beyond the grafted segment, the decision was made to attempt intervention.  There was a 99% stenosis in the most proximal stent with narrowing proximal to the stented segment.  Prior stent in the midportion of the graft also had in-stent narrowing of 50-60%.  The distal graft had 80% stenosis in the region of the anastomosis, extending into the distal RCA.  There also was a 95% stenosis more distally in the PLA territory.  The 5 French sheath was exchanged for a 6 [place] sheath.  A 6 French right bypass graft catheter was used for the intervention.  Angiomax bolus plus infusion was administered.  The patient received an additional 300 mg of oral Plavix.  ACT was therapeutic.  A 2.0x12 mm Euphora balloon was initially inserted with dilatation initially in the proximal 99% in-stent restenotic segment at 12 and 14 atmospheres.  This balloon was then advanced to the very distal 95% stenosis in the PLA vessel  and several inflations were made at this site.  An Angiosculpt scoring balloon  3.0x15 was inserted and dilatations were made in the proximal stent, the previously placed mid stent, and also at the distal anastomosis site.  A Xience Alpine 3.5x28 mm DES stent was then inserted to cover the 70% stenosis proximal to the stent and the high  grade in-stent restenotic lesion.  This was dilated at 14 and 15 atmospheres.  Attention was then directed at trying to stent the distal PLA stenosis.  A 2.25x12 mm Xience DES stent was inserted, but this was unable to cross the calcified stenosis.  This was removed and a 2.0x10 mm Angiosculpt was inserted.  This also was unable to cross the calcified stenosis.  The previously placed 2.0x12 mm Euphora balloon was then reinserted and multiple dilatations were made.  Another attempt at passing the 2.25 stent was made again unsuccessfully.  A 2.0x12 mm mini vision stent was then inserted, but this also was unsuccessful.  Repeat dilatation was done with the 2 each eye 4 balloon.  Again.  An additional attempt was made to pass the angioscope balloon at the site but again this was unsuccessful.  Ultimately, a 2.25x8 mm [place] Trek balloon was used for more aggressive noncompliant dilatation.  It was felt that since this was small caliber the end result would be just treated with this last noncompliant balloon dilatation.  Attention was then redirected at the other 3 lesions.  A 3.0x18 mm Xience Alpine stent was then inserted to the distal vein graft to cover the previous 80% stenosis which at already undergone Angiosculpt and extended into the native RCA.  This was dilated at 15 and 16 atmospheres.  A 3.75x18 mm [place] Trek  was then used for 3 stented segments in the graft.  The distal stent at the anastomosis, extending into the distal RCA was post dilated up to 3.59 mm.  The previously placed mid RCA stent, which had undergone Angiosculpt today was post dilated to 3.74 mm, and the new proximal stent and previously placed stent were postdilated to 3.85 mm.  All stented segments were reduced to 0%.  The distal PLA the stenosis was reduced from 95% to 60% but remained calcified.  The arterial sheath was sutured in place.  Plans are for continuation of bivalirudin for 2 hours post procedure.  Patient left the catheterization  laboratory, chest pain-free with stable hemodynamics.  HEMODYNAMICS:   Central Aorta: 144/70   Left Ventricle: 144/16/21  ANGIOGRAPHY:  Calcified coronary arteries.  Left main: Moderate size calcified vessel, which bifurcated into the LAD and the circumflex vessel  LAD: Proximal 70 and 80% stenoses before the septal perforating artery vessels with total occlusion of the LAD after several septal perforating arteries.  Left circumflex: 90% diffuse proximal stenosis   Right coronary artery: Total proximal occlusion  LIMA to LAD: Widely patent and anastomosed into the mid LAD and there was filling of the diagonal vessel from the LAD.  There also was collateralization to the OM branch of the circumflex vessel from the apical portion of the LAD  SVG to circumflex marginal is occluded and this is old.  SVG to RCA was diffusely diseased.  Proximal to the initially placed proximal stent was a 70% stenosis.  In the stent there was a 99% stenosis in its proximal portion.  The mid RCA stent had in-stent smooth restenosis of 50-60%.  The distal vein graft to 80% stenosis proximal to the anastomosis and 70% beyond the  anastomosis.  The more distal RCA.  He PLA branch was a calcified 95% stenosis.  Left ventriculography revealed mild LV dysfunction with an ejection fraction of 50%-55% with mid to basal posterior hypokinesis.  PCI Result:   Following complex intervention involving 4 sites:  The 70% very proximal narrowing in the vein graft prior to the first previously placed stent and a 99% in-stent restenotic region were reduced to 0% scoring balloon and 3.5x28 mm Xience DES postdilated to 3.85 mm.  The in-stent restenosis of 50-60% in the stent placed in 2011 treated with scoring balloon and noncompliant balloon dilatation was reduced to 0% with dilatation up to 3.74 mm.  The distal SVG 80% stenosis extending into the native RCA, treated with scoring balloon and insertion of a new science 3.0x18  mm stent was reduced to 0% and postdilated to 3.59 mm.  The very distal calcified PLA 95% stenosis was treated with PTCA and failed attempts at passing scoring balloon, and multiple stents, with ultimate treatment with noncompliant balloon dilatation to approximately 60%.    IMPRESSION:  Significant native coronary artery disease with proximal 80% LAD stenoses and total mid LAD occlusion; 90% circumflex stenosis; and total proximal occlusion of the native RCA.  Patent LIMA graft to the LAD.  Old SVG occlusion of the graft, which had supplied the circumflex marginal vessel.  SVG to RCA, with 70% new proximal stenosis, 99% in-stent restenosis in the proximal segment of the more proximal previously placed graft, 50-60% in-stent restenosis in the previously placed mid RCA stent, new 80 to percent distal anastomosis stenosis extending into the native RCA, and 95% calcified PLA stenosis.  Successful 4 lesion coronary intervention involving the SVG to RCA proximally, mid, and distally, as well as the PLA branch of the distal native RCA   Lennette Bihari, MD, [facility] 07/30/2013 12:44 PM

## 2013-07-30 NOTE — Care Management Note (Addendum)
  Page 1 of 1   07/30/2013     2:22:52 PM CARE MANAGEMENT NOTE 07/30/2013  Patient:  JOSTIN, RUE   Account Number:  [id number]  Date Initiated:  07/30/2013  Documentation initiated by:  HUTCHINSON,CRYSTAL  Subjective/Objective Assessment:   unstable angina     Action/Plan:   CM to follow for disposition needs   Anticipated DC Date:  07/31/2013   Anticipated DC Plan:  HOME/SELF CARE         Choice offered to / List presented to:             Status of service:  Completed, signed off Medicare Important Message given?   (If response is "NO", the following Medicare IM given date fields will be blank) Date Medicare IM given:   Date Additional Medicare IM given:    Discharge Disposition:  HOME/SELF CARE  Per UR Regulation:    If discussed at Long Length of Stay Meetings, dates discussed:    Comments:  Crystal Hutchinson RN, BSN, M[facility], CCM  Nurse - Case Manager, (Unit [phone]  07/30/2013 Med Review: Plavix

## 2013-07-31 ENCOUNTER — Telehealth: Payer: Self-pay | Admitting: Cardiology

## 2013-07-31 ENCOUNTER — Encounter (HOSPITAL_COMMUNITY): Payer: Self-pay | Admitting: Physician Assistant

## 2013-07-31 DIAGNOSIS — I2510 Atherosclerotic heart disease of native coronary artery without angina pectoris: Secondary | ICD-10-CM | POA: Diagnosis present

## 2013-07-31 DIAGNOSIS — R55 Syncope and collapse: Secondary | ICD-10-CM | POA: Insufficient documentation

## 2013-07-31 DIAGNOSIS — N189 Chronic kidney disease, unspecified: Secondary | ICD-10-CM | POA: Insufficient documentation

## 2013-07-31 DIAGNOSIS — D649 Anemia, unspecified: Secondary | ICD-10-CM | POA: Diagnosis present

## 2013-07-31 DIAGNOSIS — K219 Gastro-esophageal reflux disease without esophagitis: Secondary | ICD-10-CM | POA: Insufficient documentation

## 2013-07-31 LAB — BASIC METABOLIC PANEL
BUN: 21 mg/dL (ref 6–23)
CALCIUM: 8.9 mg/dL (ref 8.4–10.5)
CHLORIDE: 104 meq/L (ref 96–112)
CO2: 20 meq/L (ref 19–32)
Creatinine, Ser: 0.91 mg/dL (ref 0.50–1.35)
GFR calc Af Amer: >90 mL/min (ref >90)
GFR calc non Af Amer: 82 mL/min — ABNORMAL LOW (ref >90)
Glucose, Bld: 95 mg/dL (ref 70–99)
Potassium: 4.2 mEq/L (ref 3.7–5.3)
Sodium: 140 mEq/L (ref 137–147)

## 2013-07-31 LAB — GLUCOSE, CAPILLARY: Glucose-Capillary: 129 mg/dL — ABNORMAL HIGH (ref 70–99)

## 2013-07-31 LAB — CBC
HEMATOCRIT: 31.6 % — AB (ref 39.0–52.0)
Hemoglobin: 10.6 g/dL — ABNORMAL LOW (ref 13.0–17.0)
MCH: 33.5 pg (ref 26.0–34.0)
MCHC: 33.5 g/dL (ref 30.0–36.0)
MCV: 100 fL (ref 78.0–100.0)
Platelets: 217 10*3/uL (ref 150–400)
RBC: 3.16 MIL/uL — ABNORMAL LOW (ref 4.22–5.81)
RDW: 14 % (ref 11.5–15.5)
WBC: 8.4 10*3/uL (ref 4.0–10.5)

## 2013-07-31 MED ORDER — PREDNISONE 1 MG PO TABS
4.0000 mg | ORAL_TABLET | Freq: Every day | ORAL | Status: DC
  Filled 2013-07-31: qty 4

## 2013-07-31 MED ORDER — PREDNISONE 5 MG PO TABS
5.0000 mg | ORAL_TABLET | Freq: Every day | ORAL | Status: DC
  Administered 2013-07-31: 5 mg via ORAL
  Filled 2013-07-31 (×2): qty 1

## 2013-07-31 MED ORDER — AMLODIPINE BESYLATE 5 MG PO TABS: 5.0000 mg | ORAL_TABLET | Freq: Every day | ORAL | Status: DC

## 2013-07-31 MED ORDER — GLIMEPIRIDE 1 MG PO TABS
1.0000 mg | ORAL_TABLET | Freq: Every day | ORAL | Status: DC
  Administered 2013-07-31: 10:00:00 1 mg via ORAL
  Filled 2013-07-31 (×2): qty 1

## 2013-07-31 MED ORDER — METFORMIN HCL 1000 MG PO TABS: 1000.0000 mg | ORAL_TABLET | Freq: Two times a day (BID) | ORAL | Status: DC

## 2013-07-31 MED ORDER — ISOSORBIDE MONONITRATE ER 30 MG PO TB24: 30.0000 mg | ORAL_TABLET | Freq: Every day | ORAL | Status: DC

## 2013-07-31 MED ORDER — LOSARTAN POTASSIUM 50 MG PO TABS
50.0000 mg | ORAL_TABLET | Freq: Two times a day (BID) | ORAL | Status: DC
  Filled 2013-07-31 (×3): qty 1

## 2013-07-31 MED ORDER — ASPIRIN EC 81 MG PO TBEC: 81.0000 mg | DELAYED_RELEASE_TABLET | Freq: Every day | ORAL | Status: DC

## 2013-07-31 MED FILL — Sodium Chloride IV Soln 0.9%: INTRAVENOUS | Qty: 50 | Status: AC

## 2013-07-31 NOTE — Discharge Summary (Signed)
I saw & evaluated the patient post PCI - stable for d/c.  Agree with brief summary.  Marykay Lex, MD

## 2013-07-31 NOTE — Telephone Encounter (Signed)
Closed encounter °

## 2013-07-31 NOTE — Discharge Summary (Signed)
Discharge Summary   Patient ID: [name] MRN: [id number], DOB/AGE: [birth year] 73 y.o. Admit date: 07/30/2013 D/C date:     07/31/2013  Primary Care Provider: Georgann Housekeeper, MD Primary Cardiologist: Tresa Endo  Primary Discharge Diagnoses:  1. CAD (h/o CABG 1997, stenting to graft-RCA, CB to LAD after LIMA in 2006, stenting to VG-RCA beyond initial stent and in-stent restenosis 2011) with recent class III-IV anginal symptoms  - yesterday underwent complex PCI involving the saphenous vein graft to the RCA and distal native RCA involving 4 sites with angiosculpt, PTCA, and stenting of the proximal vein graft and restenotic lesion in the proximal stent, angiosculpt cutting balloon, and PTCA of the mid in-stent restenosis in the vein graft, angioscope, and stenting of the distal graft anastomosis site, extending into the native RCA; and PTCA with attempted cutting balloon and attempted stenting of the distal RCA PLA vessel; EF 50-55% with mid-basal posterior HK  2. Diabetes mellitus  3. HTN  4. Hyperlipidemia  5. Anemia, acute on chronic, suspect procedurally-related  Secondary Discharge Diagnoses:  1. Reported h/o CKD - Cr 0.91 this adm 2. H/o sleep apnea 3. GERD 4. Arthritis 5. Sleep apnea 6. Barrett's esophagus 7. Presyncope 09/2012 on the setting of mild dehydration with significant straining possibly inducing a vagal event while he was dragging heavy logs  Hospital Course: Mr. [name] is a 73 y/o M with history of CAD (h/o CABGx5 1997, stenting to graft-RCA & CB to LAD after LIMA in 2006, stenting to VG-RCA beyond initial stent and in-stent restenosis 2011), DM, HTN, HLD, Barrett's esophagus who was recently experiencing substernal chest discomfort. He has tried Tums without benefit but SL NTG had helped. This chest pain was occuring with increasing frequency and almost daily. He denied any nocturnal symptoms. The patient is on Imdur and Ranexa chronically. Dr. Tresa Endo recommended cardiac  cath for evaluation so the patient presented yesterday for this procedure. Full cath note is below but patient underwent complex percutaneous coronary intervention involving the saphenous vein graft to the RCA and distal native RCA involving 4 sites with angiosculpt, PTCA, and stenting of the proximal vein graft and restenotic lesion in the proximal stent, angiosculpt cutting balloon, and PTCA of the mid in-stent restenosis in the vein graft, angioscope, and stenting of the distal graft anastomosis site, extending into the native RCA; and PTCA with attempted cutting balloon and attempted stenting of the distal RCA PLA vessel. Left ventriculography revealed mild LV dysfunction with an ejection fraction of 50%-55% with mid to basal posterior hypokinesis. The patient tolerated the cath well without complication. Hgb is down slightly this AM but felt peri-procedurally related given prolonged case and hydration. The patient denies any bleeding. It appears that Dr. Tresa Endo has reduced amlodipine from 10mg  home dose to 5mg  daily and increased Imdur from 15mg  to 30mg . We will keep these changes. The patient is not on a statin and it is not clear why this is the case. Do not see any contraindications in the note but Dr. Tresa Endo is meticulous about these things thus will defer to him regarding initiation in followup. Dr. Herbie Baltimore has seen and examined the patient today and feels he is stable for discharge. I have left a message on our office's scheduling voicemail requesting a follow-up appointment, and our office will call the patient with this appointment. Given extent of case the patient will resume his Metformin Saturday AM 6/20 (between 48-72 hr post cath).  Discharge Vitals: Blood pressure 147/65, pulse 65, temperature 98.5 F (36.9  C), temperature source Oral, resp. rate 20, height 5\' 11"  (1.803 m), weight 209 lb 14.1 oz (95.2 kg), SpO2 98.00%.  Labs: Lab Results  Component Value Date   WBC 8.4 07/31/2013   HGB  10.6* 07/31/2013   HCT 31.6* 07/31/2013   MCV 100.0 07/31/2013   PLT 217 07/31/2013     Recent Labs Lab 07/28/13 1236 07/31/13 0341  NA 141 140  K 4.8 4.2  CL 105 104  CO2 25 20  BUN 29* 21  CREATININE 1.06 0.91  CALCIUM 9.7 8.9  PROT 6.5  --   BILITOT 0.6  --   ALKPHOS 66  --   ALT 10  --   AST 14  --   GLUCOSE 104* 95    Lab Results  Component Value Date   CHOL 204* 07/28/2013   HDL 52 07/28/2013   LDLCALC 133* 07/28/2013   TRIG 93 07/28/2013    Diagnostic Studies/Procedures   Dg Chest 2 View 07/28/2013   CLINICAL DATA:  Chest pain, pre catheterization  EXAM: CHEST  2 VIEW  COMPARISON:  10/08/2012  FINDINGS: Cardiomediastinal silhouette is stable. No acute infiltrate or pleural effusion. No pulmonary edema. Status post CABG. Bilateral shoulder prosthesis.  IMPRESSION: No active cardiopulmonary disease.   Electronically Signed   By: Natasha Mead M.D.   On: 07/28/2013 14:07   Cardiac Cath 07/30/13 CARDIAC CATHETERIZATION/PERCUTANEOUS CORONARY INTERVENTION  HISTORY:  [name] is a 73 y.o. male who underwent CABG x 5 surgery in 1997. In 2006 he underwent stenting of the midportion of the graft to the RCA and cutting balloon to the LAD after the LIMA insertion. He developed an acute coronary syndrome on 02/12/2010 and a new stent was placed in the vein graft to the RCA beyond the initial stent and there was in-stent restenosis, for which both lesions were treated successfully. He had an occluded graft to the circumflex vessel and a 90% stenosis of a small marginal vessel. Additional problems include hypertension, type 2 diabetes mellitus, and hyperlipidemia. He recently has developed class III to class IV angina symptoms. He was seen in the office 2 days ago, Ranexa, and isosorbide mononitrate were added to his medical regimen. He presents now for definitive cardiac catheterization.  PROCEDURE: Left heart cardiac catheterization; coronary angiography, selective angiography into 2  saphenous vein grafts, selective angiography into the left internal mammary artery, left ventriculography, and complex percutaneous coronary intervention involving the saphenous vein graft to the RCA and distal native RCA involving 4 sites with angiosculpt, PTCA, and stenting of the proximal vein graft and restenotic lesion in the proximal stent, angiosculpt cutting balloon, and PTCA of the mid in-stent restenosis in the vein graft, angioscope, and stenting of the distal graft anastomosis site, extending into the native RCA; and PTCA with attempted cutting balloon and attempted stenting of the distal RCA PLA vessel  The patient was brought to the cath lab in the postabsorptive state. He was premedicated with Versed 2 mg and fentanyl 25 mcg. His right groin was prepped and shaved in usual sterile fashion. Xylocaine 1% was used for local anesthesia. A 5 French sheath was inserted into the R femoral artery. Diagnostic catheterizatiion was done with 5 [place] LF4, FR4, and pigtail catheters. The right catheter was used for selective angiography into the vein graft, which I supplied the circumflex vessel and the vein graft supplying the RCA. A LIMA catheter was necessary for selective angiography into the left internal mammary artery. Left ventriculography was done with  25 cc Omnipaque contrast.  With the demonstration of significant progressive disease in the vein graft supplying the RCA and diseased beyond the grafted segment, the decision was made to attempt intervention. There was a 99% stenosis in the most proximal stent with narrowing proximal to the stented segment. Prior stent in the midportion of the graft also had in-stent narrowing of 50-60%. The distal graft had 80% stenosis in the region of the anastomosis, extending into the distal RCA. There also was a 95% stenosis more distally in the PLA territory. The 5 French sheath was exchanged for a 6 [place] sheath. A 6 French right bypass graft catheter was used  for the intervention. Angiomax bolus plus infusion was administered. The patient received an additional 300 mg of oral Plavix. ACT was therapeutic. A 2.0x12 mm [place] balloon was initially inserted with dilatation initially in the proximal 99% in-stent restenotic segment at 12 and 14 atmospheres. This balloon was then advanced to the very distal 95% stenosis in the PLA vessel and several inflations were made at this site. An Angiosculpt scoring balloon 3.0x15 was inserted and dilatations were made in the proximal stent, the previously placed mid stent, and also at the distal anastomosis site. A Xience Alpine 3.5x28 mm DES stent was then inserted to cover the 70% stenosis proximal to the stent and the high grade in-stent restenotic lesion. This was dilated at 14 and 15 atmospheres. Attention was then directed at trying to stent the distal PLA stenosis. A 2.25x12 mm Xience DES stent was inserted, but this was unable to cross the calcified stenosis. This was removed and a 2.0x10 mm Angiosculpt was inserted. This also was unable to cross the calcified stenosis. The previously placed 2.0x12 mm E[place]hora balloon was then reinserted and multiple dilatations were made. Another attempt at passing the 2.25 stent was made again unsuccessfully. A 2.0x12 mm mini vision stent was then inserted, but this also was unsuccessful. Repeat dilatation was done with the 2 each eye 4 balloon. Again. An additional attempt was made to pass the angioscope balloon at the site but again this was unsuccessful. Ultimately, a 2.25x8 mm [place] Trek balloon was used for more aggressive noncompliant dilatation. It was felt that since this was small caliber the end result would be just treated with this last noncompliant balloon dilatation. Attention was then redirected at the other 3 lesions. A 3.0x18 mm Xience Alpine stent was then inserted to the distal vein graft to cover the previous 80% stenosis which at already undergone Angiosculpt and extended  into the native RCA. This was dilated at 15 and 16 atmospheres. A 3.75x18 mm [place] Trek was then used for 3 stented segments in the graft. The distal stent at the anastomosis, extending into the distal RCA was post dilated up to 3.59 mm. The previously placed mid RCA stent, which had undergone Angiosculpt today was post dilated to 3.74 mm, and the new proximal stent and previously placed stent were postdilated to 3.85 mm. All stented segments were reduced to 0%. The distal PLA the stenosis was reduced from 95% to 60% but remained calcified. The arterial sheath was sutured in place. Plans are for continuation of bivalirudin for 2 hours post procedure. Patient left the catheterization laboratory, chest pain-free with stable hemodynamics.  HEMODYNAMICS:  Central Aorta: 144/70  Left Ventricle: 144/16/21  ANGIOGRAPHY:  Calcified coronary arteries.  Left main: Moderate size calcified vessel, which bifurcated into the LAD and the circumflex vessel  LAD: Proximal 70 and 80% stenoses before the septal  perforating artery vessels with total occlusion of the LAD after several septal perforating arteries.  Left circumflex: 90% diffuse proximal stenosis  Right coronary artery: Total proximal occlusion  LIMA to LAD: Widely patent and anastomosed into the mid LAD and there was filling of the diagonal vessel from the LAD. There also was collateralization to the OM branch of the circumflex vessel from the apical portion of the LAD  SVG to circumflex marginal is occluded and this is old.  SVG to RCA was diffusely diseased. Proximal to the initially placed proximal stent was a 70% stenosis. In the stent there was a 99% stenosis in its proximal portion. The mid RCA stent had in-stent smooth restenosis of 50-60%. The distal vein graft to 80% stenosis proximal to the anastomosis and 70% beyond the anastomosis. The more distal RCA. He PLA branch was a calcified 95% stenosis.  Left ventriculography revealed mild LV dysfunction  with an ejection fraction of 50%-55% with mid to basal posterior hypokinesis.  PCI Result:  Following complex intervention involving 4 sites: The 70% very proximal narrowing in the vein graft prior to the first previously placed stent and a 99% in-stent restenotic region were reduced to 0% scoring balloon and 3.5x28 mm Xience DES postdilated to 3.85 mm.  The in-stent restenosis of 50-60% in the stent placed in 2011 treated with scoring balloon and noncompliant balloon dilatation was reduced to 0% with dilatation up to 3.74 mm.  The distal SVG 80% stenosis extending into the native RCA, treated with scoring balloon and insertion of a new science 3.0x18 mm stent was reduced to 0% and postdilated to 3.59 mm.  The very distal calcified PLA 95% stenosis was treated with PTCA and failed attempts at passing scoring balloon, and multiple stents, with ultimate treatment with noncompliant balloon dilatation to approximately 60%.  IMPRESSION:  Significant native coronary artery disease with proximal 80% LAD stenoses and total mid LAD occlusion; 90% circumflex stenosis; and total proximal occlusion of the native RCA.  Patent LIMA graft to the LAD.  Old SVG occlusion of the graft, which had supplied the circumflex marginal vessel.  SVG to RCA, with 70% new proximal stenosis, 99% in-stent restenosis in the proximal segment of the more proximal previously placed graft, 50-60% in-stent restenosis in the previously placed mid RCA stent, new 80 to percent distal anastomosis stenosis extending into the native RCA, and 95% calcified PLA stenosis.  Successful 4 lesion coronary intervention involving the SVG to RCA proximally, mid, and distally, as well as the PLA branch of the distal native RCA  Lennette Bihari, MD, [facility]  07/30/2013  12:44 PM   Discharge Medications   Current Discharge Medication List    CONTINUE these medications which have CHANGED   Details  amLODipine (NORVASC) 5 MG tablet Take 1 tablet (5 mg  total) by mouth daily. Qty: 30 tablet, Refills: 6    aspirin EC 81 MG tablet Take 1 tablet (81 mg total) by mouth daily.    isosorbide mononitrate (IMDUR) 30 MG 24 hr tablet Take 1 tablet (30 mg total) by mouth daily. Qty: 30 tablet, Refills: 6    metFORMIN (GLUCOPHAGE) 1000 MG tablet Take 1 tablet (1,000 mg total) by mouth 2 (two) times daily with a meal. No dose change - just instructed to start 08/02/13      CONTINUE these medications which have NOT CHANGED   Details  carvedilol (COREG) 25 MG tablet Take 25 mg by mouth 2 (two) times daily with a meal.  clopidogrel (PLAVIX) 75 MG tablet Take 75 mg by mouth daily with lunch.    folic acid (FOLVITE) 1 MG tablet Take 1 mg by mouth daily with lunch.     glimepiride (AMARYL) 1 MG tablet Take 1 mg by mouth daily.     losartan (COZAAR) 50 MG tablet Take 50 mg by mouth 2 (two) times daily.     methotrexate (RHEUMATREX) 2.5 MG tablet Take 15 mg by mouth once a week. Takes 6 tablets (15 mg) on Saturday    nitroGLYCERIN (NITROSTAT) 0.4 MG SL tablet Place 1 tablet (0.4 mg total) under the tongue every 5 (five) minutes as needed for chest pain.     pantoprazole (PROTONIX) 40 MG tablet Take 40 mg by mouth daily.    !! predniSONE (DELTASONE) 1 MG tablet Take 4 mg by mouth at bedtime.     !! predniSONE (DELTASONE) 5 MG tablet Take 5 mg by mouth daily with breakfast.    ranolazine (RANEXA) 500 MG 12 hr tablet Take 1 tablet (500 mg total) by mouth 2 (two) times daily.      !! - Potential duplicate medications found. Please discuss with provider.      Disposition   The patient will be discharged in stable condition to home. Discharge Instructions   Amb Referral to Cardiac Rehabilitation    Complete by:  As directed      Diet - low sodium heart healthy    Complete by:  As directed   Diabetic Diet     Increase activity slowly    Complete by:  As directed   No driving for 2 days. No lifting over 5 lbs for 1 week. No sexual activity  for 1 week. Keep procedure site clean & dry. If you notice increased pain, swelling, bleeding or pus, call/return!  You may shower, but no soaking baths/hot tubs/pools for 1 week.   Restart Metformin on Saturday morning 08/02/13.          Follow-up Information   Follow up with Lennette BihariKELLY,THOMAS A, MD. (Our office will call you for a follow-up appointment. Please call the office if you have not heard from us within 3 days.)    Specialty:  Cardiology   Contact information:   [address] [place] [address] [phone]         Duration of Discharge Encounter: Greater than 30 minutes including physician and PA time.  Signed, Kriste Basqueayna Dunn PA-C 07/31/2013, 9:06 AM

## 2013-07-31 NOTE — Progress Notes (Signed)
CARDIAC REHAB PHASE I   PRE:  Rate/Rhythm: 67 SR  BP:  Supine:   Sitting: 189/78  Standing:    SaO2:   MODE:  Ambulation: 1000 ft   POST:  Rate/Rhythm: 85 SR  BP:  Supine:   Sitting: 196/80  Standing:    SaO2:  0805-0905 Pt tolerated ambulation well without c/o of pain or SOB. BP up before and after walk,reported to his nurse. Completed stent discharge education with pt. He voices understanding. Pt agrees to Outp. CRP in [place], will send referral.  Melina Copa RN 07/31/2013 9:05 AM

## 2013-07-31 NOTE — Progress Notes (Signed)
Patient: [name] / Admit Date: 07/30/2013 / Date of Encounter: 07/31/2013, 6:31 AM  Subjective  Feels well. No CP or SOB.   Objective   Telemetry: NSR, rare PVC  Physical Exam: Blood pressure 138/54, pulse 64, temperature 97.5 F (36.4 C), temperature source Oral, resp. rate 18, height 5\' 11"  (1.803 m), weight 204 lb (92.534 kg), SpO2 96.00%. General: Well developed, well nourished, in no acute distress. Head: Normocephalic, atraumatic, sclera non-icteric, no xanthomas, nares are without discharge. Neck:  JVP not elevated. Lungs: Clear bilaterally to auscultation without wheezes, rales, or rhonchi. Breathing is unlabored. Heart: RRR S1 S2 without murmurs, rubs, or gallops.  Abdomen: Soft, non-tender, non-distended with normoactive bowel sounds. No rebound/guarding. Extremities: No clubbing or cyanosis. No edema. Distal pedal pulses are 2+ and equal bilaterally. R groin without significant ecchymosis, hematoma or bruit Neuro: Alert and oriented X 3. Moves all extremities spontaneously. Psych:  Responds to questions appropriately with a normal affect.   Intake/Output Summary (Last 24 hours) at 07/31/13 0631 Last data filed at 07/30/13 2053  Gross per 24 hour  Intake 1356.67 ml  Output    955 ml  Net 401.67 ml    Inpatient Medications:  . amLODipine  5 mg Oral Daily  . aspirin EC  81 mg Oral Daily  . carvedilol  25 mg Oral BID WC  . clopidogrel  75 mg Oral Q breakfast  . isosorbide mononitrate  30 mg Oral Daily  . ranolazine  500 mg Oral BID   Infusions:  . sodium chloride 100 mL/hr at 07/30/13 2000    Labs:  Recent Labs  07/28/13 1236 07/31/13 0341  NA 141 140  K 4.8 4.2  CL 105 104  CO2 25 20  GLUCOSE 104* 95  BUN 29* 21  CREATININE 1.06 0.91  CALCIUM 9.7 8.9    Recent Labs  07/28/13 1236  AST 14  ALT 10  ALKPHOS 66  BILITOT 0.6  PROT 6.5  ALBUMIN 4.3    Recent Labs  07/28/13 1236 07/31/13 0341  WBC 10.1 8.4  HGB 12.1* 10.6*  HCT  35.7* 31.6*  MCV 98.1 100.0  PLT 293 217   No results found for this basename: CKTOTAL, CKMB, TROPONINI,  in the last 72 hours No components found with this basename: POCBNP,  No results found for this basename: HGBA1C,  in the last 72 hours   Radiology/Studies:  Dg Chest 2 View 07/28/2013   CLINICAL DATA:  Chest pain, pre catheterization  EXAM: CHEST  2 VIEW  COMPARISON:  10/08/2012  FINDINGS: Cardiomediastinal silhouette is stable. No acute infiltrate or pleural effusion. No pulmonary edema. Status post CABG. Bilateral shoulder prosthesis.  IMPRESSION: No active cardiopulmonary disease.   Electronically Signed   By: 10/10/2012 M.D.   On: 07/28/2013 14:07     Assessment and Plan  1. CAD (h/o CABG 1997, stenting to graft-RCA, CB to LAD after LIMA in 2006, stenting to VG-RCA beyond initial stent and in-stent restenosis) with recent class III-IV anginal symptoms - yesterday underwent complex PCI involving the saphenous vein graft to the RCA and distal native RCA involving 4 sites with angiosculpt, PTCA, and stenting of the proximal vein graft and restenotic lesion in the proximal stent, angiosculpt cutting balloon, and PTCA of the mid in-stent restenosis in the vein graft, angioscope, and stenting of the distal graft anastomosis site, extending into the native RCA; and PTCA with attempted cutting balloon and attempted stenting of the distal RCA PLA vessel; EF 50-55%  with mid-basal posterior HK 2. Diabetes mellitus 3. HTN 4. Hyperlipidemia 5. Anemia, acute on chronic, suspect procedurally-related  Doing well post-cath. MD to advise regarding home meds vs inpatient meds. Amlodipine has been reduced to 5mg  (from 10mg  outpatient) and Imdur increased to 30mg  (from 15). He is also on Cozaar which was not continued here, as well as Prednisone, methotrexate, glipizide and Metformin (latter will need to be held 48 hr post-cath). Not on statin at home - he does not know why and we cannot find info in Dr.  chart regarding this. D/w Dr. - will defer to Dr. who knows pt well in followup.  Signed, Landry Dyke PA-C  I have seen and evaluated the patient this Am along with Dayna Dunn PA-C. I agree with her findings, examination as well as impression recommendations.  Otherwise stable post PCI.  Agree that pre-procedure med changes are confusing - restart ARB & keep CCB @ 5 for now with Imdur.    Extensive SVG & Native RCA PCI-PTCA.    Needs to ambulate to prove groin stability & no angina prior to d/c.  Drop in Hgb not unexpected with aggressive hydration & prolonged PCI case.    Herbie Baltimore, M.D., M.S. Interventional Cardiologist   Pager # [phone] 07/31/2013

## 2013-08-12 ENCOUNTER — Telehealth (HOSPITAL_COMMUNITY): Payer: Self-pay | Admitting: Cardiac Rehabilitation

## 2013-08-12 NOTE — Telephone Encounter (Signed)
pc to pt to enroll in cardiac rehab. Pt declined stating too far for him to drive. Dr. Tresa Endo made aware.

## 2013-08-14 ENCOUNTER — Encounter: Payer: Self-pay | Admitting: Cardiology

## 2013-08-14 ENCOUNTER — Ambulatory Visit (INDEPENDENT_AMBULATORY_CARE_PROVIDER_SITE_OTHER): Payer: Medicare Other | Admitting: Cardiology

## 2013-08-14 VITALS — BP 144/77 | HR 75 | Ht 71.0 in | Wt 206.0 lb

## 2013-08-14 DIAGNOSIS — E782 Mixed hyperlipidemia: Secondary | ICD-10-CM

## 2013-08-14 DIAGNOSIS — Z79899 Other long term (current) drug therapy: Secondary | ICD-10-CM

## 2013-08-14 DIAGNOSIS — E785 Hyperlipidemia, unspecified: Secondary | ICD-10-CM

## 2013-08-14 DIAGNOSIS — I200 Unstable angina: Secondary | ICD-10-CM

## 2013-08-14 MED ORDER — EZETIMIBE-SIMVASTATIN 10-20 MG PO TABS: 1.0000 | ORAL_TABLET | Freq: Every day | ORAL | Status: DC

## 2013-08-14 NOTE — Patient Instructions (Signed)
1. Your physician recommends that you schedule a follow-up appointment in: September or October  2. Have your labs done just prior to your visit with Dr. Tresa Endo

## 2013-08-14 NOTE — Assessment & Plan Note (Addendum)
LDL is 130 He has agreed to go back on Vytorin, will begin at 10/20, not sure why pt came off the medication. Will check hepatic and lipid in Sept before follow up with Dr. Tresa Endo.

## 2013-08-14 NOTE — Progress Notes (Signed)
08/14/2013   PCP: Georgann Housekeeper, MD   Chief Complaint  Patient presents with  . Follow-up    S/P hospital visit    Primary Cardiologist: Dr. Bishop Limbo   HPI:  73 year old white male with hx. Of coronary artery disease dating back to 1997 as is s/p CABGx5 revascularization surgery. In 2006 he underwent stenting of the midportion of the graft to the right coronary artery and cutting balloon to the LAD after the LIMA insertion. On New Year's Eve 2011 he developed an acute coronary syndrome was found to have a new subtotal stenosis in the graft to right coronary artery more distal to the prior stent and also had in-stent narrowing to the proximal portion of the previously placed stent. He underwent successful intervention with a 3.5x18 mm stent. His LIMA to the LAD was patent. The graft to the circumflex was occluded. He had 90% stenosis in a small marginal vessel. He does have a history of hypertension, type 2 diabetes mellitus, as well as hyperlipidemia. His last nuclear perfusion study was in February 2014 which continued to show normal perfusion without scar or ischemia. An echo Doppler study at that time showed an ejection fraction of 55-60% with mild aortic sclerosis without stenosis. He had mild pulmonary hypertension with a PA pressure 32 mm.   He saw Dr. Tresa Endo June 15 he was having chest pain after eating and increasing episodes of chest pain with nitroglycerin his chest pain would improve within a minute. Dr. Tresa Endo arranged for cardiac catheterization and patient underwent complex PCI involving the saphenous vein graft to the RCA and distal native RCA involving 4 sites with angiosculpt, PTCA, and stenting of the proximal vein graft and restenotic lesion in the proximal stent, angiosculpt cutting balloon, and PTCA of the mid in-stent restenosis in the vein graft, angioscope, and stenting of the distal graft anastomosis site, extending into the native RCA; and PTCA with attempted  cutting balloon and attempted stenting of the distal RCA PLA vessel; EF 50-55% with mid-basal posterior HK.  Today patient is back for follow-up.  His chest pain is completely resolved he has no pain and no shortness of breath he feels quite well.  It was noticed during hospitalization he was not on statin. I discussed it with the patient and his wife and they are not sure why he was on it he may just stop taking it at one point he was on Vytorin.  I will resume the Vytorin due to elevated LDL during hospitalization.   Lipid Panel     Component Value Date/Time   CHOL 204* 07/28/2013 1236   TRIG 93 07/28/2013 1236   HDL 52 07/28/2013 1236   CHOLHDL 3.9 07/28/2013 1236   VLDL 19 07/28/2013 1236   LDLCALC 133* 07/28/2013 1236     Allergies  Allergen Reactions  . Ivp Dye [Iodinated Diagnostic Agents] Other (See Comments)    Increased blood pressure, heart rate    Current Outpatient Prescriptions  Medication Sig Dispense Refill  . amLODipine (NORVASC) 5 MG tablet Take 1 tablet (5 mg total) by mouth daily.  30 tablet  6  . aspirin EC 81 MG tablet Take 1 tablet (81 mg total) by mouth daily.      . carvedilol (COREG) 25 MG tablet Take 25 mg by mouth 2 (two) times daily with a meal.      . clopidogrel (PLAVIX) 75 MG tablet Take 75 mg by mouth daily with  lunch.      . folic acid (FOLVITE) 1 MG tablet Take 1 mg by mouth daily with lunch.       . glimepiride (AMARYL) 1 MG tablet Take 1 mg by mouth daily.       . isosorbide mononitrate (IMDUR) 30 MG 24 hr tablet Take 1 tablet (30 mg total) by mouth daily.  30 tablet  6  . losartan (COZAAR) 50 MG tablet Take 50 mg by mouth 2 (two) times daily.       . metFORMIN (GLUCOPHAGE) 1000 MG tablet Take 1 tablet (1,000 mg total) by mouth 2 (two) times daily with a meal.      . methotrexate (RHEUMATREX) 2.5 MG tablet Take 15 mg by mouth once a week. Takes 6 tablets (15 mg) on Saturday      . nitroGLYCERIN (NITROSTAT) 0.4 MG SL tablet Place 1 tablet (0.4 mg  total) under the tongue every 5 (five) minutes as needed for chest pain.  25 tablet  3  . pantoprazole (PROTONIX) 40 MG tablet Take 40 mg by mouth daily.      . predniSONE (DELTASONE) 1 MG tablet Take 4 mg by mouth at bedtime.       . predniSONE (DELTASONE) 5 MG tablet Take 5 mg by mouth daily with breakfast.      . ranolazine (RANEXA) 500 MG 12 hr tablet Take 1 tablet (500 mg total) by mouth 2 (two) times daily.  56 tablet  0  . ezetimibe-simvastatin (VYTORIN) 10-20 MG per tablet Take 1 tablet by mouth daily.  30 tablet  6   No current facility-administered medications for this visit.    Past Medical History  Diagnosis Date  . Coronary artery disease     a. h/o CABG 1997. b. stenting to graft-RCA, CB to LAD after LIMA in 2006. c. stenting to VG-RCA beyond initial stent and in-stent restenosis in 2011. c. Complex PCI 07/2013 to SVG-RCA and distal native RCA involving 4 sites (see cath report).  . Hypertension   . Hyperlipidemia   . Diabetes   . Heart murmur   . Chronic kidney disease   . Myocardial infarction   . GERD (gastroesophageal reflux disease)   . Arthritis   . Sleep apnea     hx of   . Barrett's esophagus   . Pre-syncope     a. 09/2012: in the setting of mild dehydration with significant straining possibly inducing a vagal event while he was dragging heavy logs.     Past Surgical History  Procedure Laterality Date  . Coronary angioplasty with stent placement    . Coronary artery bypass graft    . Joint replacement    . Coronary stent placement  07/30/2013    DES to RCA     DR KELLY    QHU:TMLYYTK:PT colds or fevers, no weight changes Skin:no rashes or ulcers HEENT:no blurred vision, no congestion CV:see HPI PUL:see HPI GI:no diarrhea constipation or melena, no indigestion GU:no hematuria, no dysuria MS:no joint pain, no claudication Neuro:no syncope, no lightheadedness Endo:++ diabetes stable, no thyroid disease  Wt Readings from Last 3 Encounters:  08/14/13  206 lb (93.441 kg)  07/31/13 209 lb 14.1 oz (95.2 kg)  07/31/13 209 lb 14.1 oz (95.2 kg)    PHYSICAL EXAM BP 144/77  Pulse 75  Ht 5\' 11"  (1.803 m)  Wt 206 lb (93.441 kg)  BMI 28.74 kg/m2 General:Pleasant affect, NAD Skin:Warm and dry, brisk capillary refill HEENT:normocephalic, sclera clear, mucus membranes moist  Neck:supple, no JVD, no bruits, no adenopathy  Heart:S1S2 RRR with 2/6 systolic murmur, no gallup, rub or click Lungs:clear without rales, rhonchi, or wheezes SNK:NLZJ, non tender, + BS, do not palpate liver spleen or masses Ext:no lower ext edema, 2+ pedal pulses, 2+ radial pulses Neuro:alert and oriented, MAE, follows commands, + facial symmetry   ASSESSMENT AND PLAN Unstable angina Resolved after PCI, no further chest pain  Coronary artery disease a. h/o CABG 1997. b. stenting to graft-RCA, CB to LAD after LIMA in 2006. c. stenting to VG-RCA beyond initial stent and in-stent restenosis in 2011. c. Complex PCI 07/2013 to SVG-RCA and distal native RCA involving 4 sites (see cath report).   Hyperlipidemia with target LDL less than 70 LDL is 130 He has agreed to go back on Vytorin, will begin at 10/20, not sure why pt came off the medication. Will check hepatic and lipid in Sept before follow up with Dr. Tresa Endo.  Diabetes mellitus Stable, followed by PCP

## 2013-08-14 NOTE — Assessment & Plan Note (Signed)
Stable, followed by PCP 

## 2013-08-14 NOTE — Assessment & Plan Note (Signed)
Resolved after PCI, no further chest pain

## 2013-08-14 NOTE — Assessment & Plan Note (Signed)
a. h/o CABG 1997. b. stenting to graft-RCA, CB to LAD after LIMA in 2006. c. stenting to VG-RCA beyond initial stent and in-stent restenosis in 2011. c. Complex PCI 07/2013 to SVG-RCA and distal native RCA involving 4 sites (see cath report).

## 2013-08-21 ENCOUNTER — Telehealth: Payer: Self-pay | Admitting: *Deleted

## 2013-08-21 NOTE — Telephone Encounter (Signed)
Returned cardiac rehab phase ll prescription to [facility].

## 2013-10-28 ENCOUNTER — Encounter (HOSPITAL_COMMUNITY): Payer: Self-pay | Admitting: Emergency Medicine

## 2013-10-28 ENCOUNTER — Emergency Department (HOSPITAL_COMMUNITY): Payer: Medicare Other

## 2013-10-28 ENCOUNTER — Inpatient Hospital Stay (HOSPITAL_COMMUNITY)
Admission: EM | Admit: 2013-10-28 | Discharge: 2013-10-29 | DRG: 309 | Disposition: A | Discharge status: Home / Self Care | Payer: Medicare Other | Attending: Cardiovascular Disease | Admitting: Cardiovascular Disease

## 2013-10-28 DIAGNOSIS — E785 Hyperlipidemia, unspecified: Secondary | ICD-10-CM | POA: Diagnosis present

## 2013-10-28 DIAGNOSIS — I10 Essential (primary) hypertension: Secondary | ICD-10-CM | POA: Diagnosis not present

## 2013-10-28 DIAGNOSIS — E119 Type 2 diabetes mellitus without complications: Secondary | ICD-10-CM | POA: Diagnosis present

## 2013-10-28 DIAGNOSIS — I252 Old myocardial infarction: Secondary | ICD-10-CM | POA: Diagnosis not present

## 2013-10-28 DIAGNOSIS — Z8679 Personal history of other diseases of the circulatory system: Secondary | ICD-10-CM

## 2013-10-28 DIAGNOSIS — Z951 Presence of aortocoronary bypass graft: Secondary | ICD-10-CM | POA: Diagnosis not present

## 2013-10-28 DIAGNOSIS — I200 Unstable angina: Secondary | ICD-10-CM | POA: Diagnosis present

## 2013-10-28 DIAGNOSIS — K219 Gastro-esophageal reflux disease without esophagitis: Secondary | ICD-10-CM | POA: Diagnosis present

## 2013-10-28 DIAGNOSIS — I129 Hypertensive chronic kidney disease with stage 1 through stage 4 chronic kidney disease, or unspecified chronic kidney disease: Secondary | ICD-10-CM | POA: Diagnosis present

## 2013-10-28 DIAGNOSIS — Z9861 Coronary angioplasty status: Secondary | ICD-10-CM | POA: Diagnosis not present

## 2013-10-28 DIAGNOSIS — I2510 Atherosclerotic heart disease of native coronary artery without angina pectoris: Secondary | ICD-10-CM | POA: Diagnosis present

## 2013-10-28 DIAGNOSIS — N189 Chronic kidney disease, unspecified: Secondary | ICD-10-CM | POA: Diagnosis present

## 2013-10-28 DIAGNOSIS — Z7982 Long term (current) use of aspirin: Secondary | ICD-10-CM | POA: Diagnosis not present

## 2013-10-28 DIAGNOSIS — M129 Arthropathy, unspecified: Secondary | ICD-10-CM | POA: Diagnosis present

## 2013-10-28 DIAGNOSIS — I4891 Unspecified atrial fibrillation: Principal | ICD-10-CM | POA: Diagnosis present

## 2013-10-28 DIAGNOSIS — Z7902 Long term (current) use of antithrombotics/antiplatelets: Secondary | ICD-10-CM | POA: Diagnosis not present

## 2013-10-28 DIAGNOSIS — I25118 Atherosclerotic heart disease of native coronary artery with other forms of angina pectoris: Secondary | ICD-10-CM

## 2013-10-28 DIAGNOSIS — I119 Hypertensive heart disease without heart failure: Secondary | ICD-10-CM | POA: Diagnosis present

## 2013-10-28 DIAGNOSIS — E876 Hypokalemia: Secondary | ICD-10-CM | POA: Diagnosis present

## 2013-10-28 DIAGNOSIS — I208 Other forms of angina pectoris: Secondary | ICD-10-CM

## 2013-10-28 DIAGNOSIS — Z79899 Other long term (current) drug therapy: Secondary | ICD-10-CM

## 2013-10-28 LAB — HEPATIC FUNCTION PANEL
ALT: 13 U/L (ref 0–53)
AST: 18 U/L (ref 0–37)
Albumin: 3.8 g/dL (ref 3.5–5.2)
Alkaline Phosphatase: 78 U/L (ref 39–117)
Bilirubin, Direct: <0.2 mg/dL (ref 0.0–0.3)
TOTAL PROTEIN: 6.8 g/dL (ref 6.0–8.3)
Total Bilirubin: 0.3 mg/dL (ref 0.3–1.2)

## 2013-10-28 LAB — BASIC METABOLIC PANEL
Anion gap: 15 (ref 5–15)
BUN: 26 mg/dL — ABNORMAL HIGH (ref 6–23)
CHLORIDE: 103 meq/L (ref 96–112)
CO2: 22 mEq/L (ref 19–32)
Calcium: 10.2 mg/dL (ref 8.4–10.5)
Creatinine, Ser: 1.06 mg/dL (ref 0.50–1.35)
GFR calc Af Amer: 78 mL/min — ABNORMAL LOW (ref >90)
GFR, EST NON AFRICAN AMERICAN: 68 mL/min — AB (ref >90)
GLUCOSE: 88 mg/dL (ref 70–99)
POTASSIUM: 4.4 meq/L (ref 3.7–5.3)
Sodium: 140 mEq/L (ref 137–147)

## 2013-10-28 LAB — CBC
HCT: 35.4 % — ABNORMAL LOW (ref 39.0–52.0)
HEMOGLOBIN: 12.4 g/dL — AB (ref 13.0–17.0)
MCH: 33.5 pg (ref 26.0–34.0)
MCHC: 35 g/dL (ref 30.0–36.0)
MCV: 95.7 fL (ref 78.0–100.0)
Platelets: 268 10*3/uL (ref 150–400)
RBC: 3.7 MIL/uL — ABNORMAL LOW (ref 4.22–5.81)
RDW: 13.9 % (ref 11.5–15.5)
WBC: 8.3 10*3/uL (ref 4.0–10.5)

## 2013-10-28 LAB — PROTIME-INR
INR: 1.02 (ref 0.00–1.49)
PROTHROMBIN TIME: 13.4 s (ref 11.6–15.2)

## 2013-10-28 LAB — CBG MONITORING, ED: Glucose-Capillary: 82 mg/dL (ref 70–99)

## 2013-10-28 LAB — GLUCOSE, CAPILLARY: GLUCOSE-CAPILLARY: 212 mg/dL — AB (ref 70–99)

## 2013-10-28 LAB — I-STAT TROPONIN, ED: TROPONIN I, POC: 0.05 ng/mL (ref 0.00–0.08)

## 2013-10-28 LAB — APTT: aPTT: 26 seconds (ref 24–37)

## 2013-10-28 MED ORDER — HEPARIN BOLUS VIA INFUSION
4000.0000 [IU] | Freq: Once | INTRAVENOUS | Status: AC
  Administered 2013-10-28: 4000 [IU] via INTRAVENOUS
  Filled 2013-10-28: qty 4000

## 2013-10-28 MED ORDER — CARVEDILOL 25 MG PO TABS
25.0000 mg | ORAL_TABLET | Freq: Two times a day (BID) | ORAL | Status: DC
  Filled 2013-10-28: qty 1

## 2013-10-28 MED ORDER — SODIUM CHLORIDE 0.9 % IJ SOLN: 3.0000 mL | Freq: Two times a day (BID) | INTRAMUSCULAR | Status: DC

## 2013-10-28 MED ORDER — INSULIN ASPART 100 UNIT/ML ~~LOC~~ SOLN
0.0000 [IU] | Freq: Every day | SUBCUTANEOUS | Status: DC
  Administered 2013-10-28: 2 [IU] via SUBCUTANEOUS

## 2013-10-28 MED ORDER — FOLIC ACID 1 MG PO TABS
1.0000 mg | ORAL_TABLET | Freq: Every day | ORAL | Status: DC
  Administered 2013-10-29: 1 mg via ORAL
  Filled 2013-10-28: qty 1

## 2013-10-28 MED ORDER — HEPARIN (PORCINE) IN NACL 100-0.45 UNIT/ML-% IJ SOLN
1200.0000 [IU]/h | INTRAMUSCULAR | Status: DC
  Administered 2013-10-28 – 2013-10-29 (×2): 1200 [IU]/h via INTRAVENOUS
  Filled 2013-10-28 (×2): qty 250

## 2013-10-28 MED ORDER — LOSARTAN POTASSIUM 25 MG PO TABS
25.0000 mg | ORAL_TABLET | Freq: Two times a day (BID) | ORAL | Status: DC
Start: 2013-10-28 — End: 2013-10-29
  Administered 2013-10-28 – 2013-10-29 (×2): 25 mg via ORAL
  Filled 2013-10-28 (×3): qty 1

## 2013-10-28 MED ORDER — ASPIRIN EC 81 MG PO TBEC
81.0000 mg | DELAYED_RELEASE_TABLET | Freq: Every day | ORAL | Status: DC
  Administered 2013-10-29: 81 mg via ORAL
  Filled 2013-10-28 (×2): qty 1

## 2013-10-28 MED ORDER — ALPRAZOLAM 0.25 MG PO TABS: 0.2500 mg | ORAL_TABLET | Freq: Two times a day (BID) | ORAL | Status: DC | PRN

## 2013-10-28 MED ORDER — DILTIAZEM HCL 100 MG IV SOLR
5.0000 mg/h | INTRAVENOUS | Status: DC
  Administered 2013-10-28: 5 mg/h via INTRAVENOUS
  Filled 2013-10-28: qty 100

## 2013-10-28 MED ORDER — NITROGLYCERIN 0.4 MG SL SUBL: 0.4000 mg | SUBLINGUAL_TABLET | SUBLINGUAL | Status: DC | PRN

## 2013-10-28 MED ORDER — CARVEDILOL 25 MG PO TABS
37.5000 mg | ORAL_TABLET | Freq: Two times a day (BID) | ORAL | Status: DC
  Administered 2013-10-29 (×2): 37.5 mg via ORAL
  Filled 2013-10-28 (×3): qty 1

## 2013-10-28 MED ORDER — SODIUM CHLORIDE 0.9 % IJ SOLN: 3.0000 mL | INTRAMUSCULAR | Status: DC | PRN

## 2013-10-28 MED ORDER — CLOPIDOGREL BISULFATE 75 MG PO TABS
75.0000 mg | ORAL_TABLET | Freq: Every day | ORAL | Status: DC
  Administered 2013-10-29: 75 mg via ORAL
  Filled 2013-10-28: qty 1

## 2013-10-28 MED ORDER — ONDANSETRON HCL 4 MG/2ML IJ SOLN: 4.0000 mg | Freq: Four times a day (QID) | INTRAMUSCULAR | Status: DC | PRN

## 2013-10-28 MED ORDER — DILTIAZEM LOAD VIA INFUSION
10.0000 mg | Freq: Once | INTRAVENOUS | Status: AC
  Administered 2013-10-28: 10 mg via INTRAVENOUS
  Filled 2013-10-28: qty 10

## 2013-10-28 MED ORDER — ACETAMINOPHEN 325 MG PO TABS: 650.0000 mg | ORAL_TABLET | ORAL | Status: DC | PRN

## 2013-10-28 MED ORDER — INSULIN ASPART 100 UNIT/ML ~~LOC~~ SOLN
0.0000 [IU] | Freq: Three times a day (TID) | SUBCUTANEOUS | Status: DC
Start: 2013-10-29 — End: 2013-10-29

## 2013-10-28 MED ORDER — PREDNISONE 5 MG PO TABS
5.0000 mg | ORAL_TABLET | Freq: Every day | ORAL | Status: DC
  Administered 2013-10-29: 5 mg via ORAL
  Filled 2013-10-28 (×2): qty 1

## 2013-10-28 MED ORDER — SODIUM CHLORIDE 0.9 % IV SOLN: 250.0000 mL | INTRAVENOUS | Status: DC | PRN

## 2013-10-28 MED ORDER — ZOLPIDEM TARTRATE 5 MG PO TABS: 5.0000 mg | ORAL_TABLET | Freq: Every evening | ORAL | Status: DC | PRN

## 2013-10-28 NOTE — ED Provider Notes (Signed)
CSN: [id number]     Arrival date & time 10/28/13  1745 History   First MD Initiated Contact with Patient 10/28/13 1808     CC: CP and weakness  (Consider location/radiation/quality/duration/timing/severity/associated sxs/prior Treatment) HPI  [name] is a pleasant 73 y.o. male with past medical history significant for hypertension, hyperlipidemia, coronary artery disease, non-insulin-dependent diabetes, with multiple bypasses and stents complaining of acute onset of severe chest pain is eating dinner last night, chest pain lasts approximately 3 hours was not relieved by one nitroglycerin and 3 tums. Patient states he "felt like he was having a heart attack but if it would be dead by now. States he did not call 911 because he didn't want to wake the neighbors. Patient states that he woke up this morning and felt generally fatigued, wife states that he was pale he has a lightheaded sensation when going from sitting to standing. States that his chest pain is 0/10 at this moment. He denies shortness of breath, nausea, vomiting, diaphoresis, history of A. Fib, cough, increasing peripheral edema, unintentional weight gain, fever and chills.   Cards: Tresa Endo  Past Medical History  Diagnosis Date  . Coronary artery disease     a. h/o CABG 1997. b. stenting to graft-RCA, CB to LAD after LIMA in 2006. c. stenting to VG-RCA beyond initial stent and in-stent restenosis in 2011. c. Complex PCI 07/2013 to SVG-RCA and distal native RCA involving 4 sites (see cath report).  . Hypertension   . Hyperlipidemia   . Diabetes   . Heart murmur   . Chronic kidney disease   . Myocardial infarction   . GERD (gastroesophageal reflux disease)   . Arthritis   . Sleep apnea     hx of   . Barrett's esophagus   . Pre-syncope     a. 09/2012: in the setting of mild dehydration with significant straining possibly inducing a vagal event while he was dragging heavy logs.    Past Surgical History  Procedure Laterality  Date  . Coronary angioplasty with stent placement    . Coronary artery bypass graft    . Joint replacement    . Coronary stent placement  07/30/2013    DES to RCA     DR KELLY   Family History  Problem Relation Age of Onset  . Heart attack Brother   . Heart disease Brother   . Diabetes Mother   . Heart disease Mother   . Hypertension Mother   . Cancer Father   . Diabetes Sister   . Hypertension Sister   . Heart disease Brother   . Diabetes Brother   . Colon cancer Neg Hx    History  Substance Use Topics  . Smoking status: Never Smoker   . Smokeless tobacco: Never Used  . Alcohol Use: Yes     Comment: 2-3 beers a month.    Review of Systems  10 systems reviewed and found to be negative, except as noted in the HPI.   Allergies  Ivp dye  Home Medications   Prior to Admission medications   Medication Sig Start Date End Date Taking? Authorizing Provider  amLODipine (NORVASC) 5 MG tablet Take 1 tablet (5 mg total) by mouth daily. 07/31/13   Dayna N Dunn, PA-C  aspirin EC 81 MG tablet Take 1 tablet (81 mg total) by mouth daily. 07/31/13   Dayna N Dunn, PA-C  carvedilol (COREG) 25 MG tablet Take 25 mg by mouth 2 (two) times daily with  a meal.    Historical Provider, MD  clopidogrel (PLAVIX) 75 MG tablet Take 75 mg by mouth daily with lunch.    Historical Provider, MD  ezetimibe-simvastatin (VYTORIN) 10-20 MG per tablet Take 1 tablet by mouth daily. 08/14/13   Nada Boozer, NP  folic acid (FOLVITE) 1 MG tablet Take 1 mg by mouth daily with lunch.     Historical Provider, MD  glimepiride (AMARYL) 1 MG tablet Take 1 mg by mouth daily.  12/13/12   Historical Provider, MD  isosorbide mononitrate (IMDUR) 30 MG 24 hr tablet Take 1 tablet (30 mg total) by mouth daily. 07/31/13   Dayna N Dunn, PA-C  losartan (COZAAR) 50 MG tablet Take 50 mg by mouth 2 (two) times daily.  07/10/13   Historical Provider, MD  metFORMIN (GLUCOPHAGE) 1000 MG tablet Take 1 tablet (1,000 mg total) by mouth 2 (two)  times daily with a meal. 08/02/13   Dayna N Dunn, PA-C  methotrexate (RHEUMATREX) 2.5 MG tablet Take 15 mg by mouth once a week. Takes 6 tablets (15 mg) on Saturday 03/28/13   Historical Provider, MD  nitroGLYCERIN (NITROSTAT) 0.4 MG SL tablet Place 1 tablet (0.4 mg total) under the tongue every 5 (five) minutes as needed for chest pain. 07/10/13   Lennette Bihari, MD  pantoprazole (PROTONIX) 40 MG tablet Take 40 mg by mouth daily.    Historical Provider, MD  predniSONE (DELTASONE) 1 MG tablet Take 4 mg by mouth at bedtime.  07/21/13   Historical Provider, MD  predniSONE (DELTASONE) 5 MG tablet Take 5 mg by mouth daily with breakfast.    Historical Provider, MD  ranolazine (RANEXA) 500 MG 12 hr tablet Take 1 tablet (500 mg total) by mouth 2 (two) times daily. 07/28/13   Lennette Bihari, MD   BP 157/77  Temp(Src) 98.9 F (37.2 C) (Oral)  Resp 20  Ht 5\' 11"  (1.803 m)  SpO2 98% Physical Exam  Nursing note and vitals reviewed. Constitutional: He is oriented to person, place, and time. He appears well-developed and well-nourished. No distress.  HENT:  Head: Normocephalic and atraumatic.  Mouth/Throat: Oropharynx is clear and moist.  Eyes: Conjunctivae and EOM are normal. Pupils are equal, round, and reactive to light.  Neck: Normal range of motion.  Cardiovascular:  Irregular, ranging between 100 and 130  Pulmonary/Chest: Effort normal and breath sounds normal. No stridor. No respiratory distress. He has no wheezes. He has no rales. He exhibits no tenderness.  Abdominal: Soft. Bowel sounds are normal. He exhibits no distension and no mass. There is no tenderness. There is no rebound and no guarding.  Musculoskeletal: Normal range of motion. He exhibits no edema and no tenderness.  Neurological: He is alert and oriented to person, place, and time.  Psychiatric: He has a normal mood and affect.    ED Course  Procedures (including critical care time) Labs Review Labs Reviewed  CBC - Abnormal;  Notable for the following:    RBC 3.70 (*)    Hemoglobin 12.4 (*)    HCT 35.4 (*)    All other components within normal limits  BASIC METABOLIC PANEL  HEPATIC FUNCTION PANEL  PROTIME-INR  APTT  I-STAT TROPOININ, ED  CBG MONITORING, ED  Rosezena Sensor, ED    Imaging Review Dg Chest 1 View  10/28/2013   CLINICAL DATA:  Chest pain  EXAM: CHEST - 1 VIEW  COMPARISON:  Multiple exams, including 07/28/2013  FINDINGS: Prior CABG. Bilateral shoulder prostheses. Heart size within normal  limits. No edema. No pleural effusion. The lungs appear clear.  IMPRESSION: 1. Prior CABG.  No acute findings.   Electronically Signed   By: Herbie Baltimore M.D.   On: 10/28/2013 19:08     EKG Interpretation   Date/Time:  Tuesday October 28 2013 18:00:30 EDT Ventricular Rate:  112 PR Interval:    QRS Duration: 86 QT Interval:  330 QTC Calculation: 450 R Axis:   82 Text Interpretation:  Atrial fibrillation with rapid ventricular response  ST \\T \ T wave abnormality, consider inferior ischemia ST \\T \ T wave  abnormality, consider anterolateral ischemia Abnormal ECG Confirmed by RAY  MD, DANIELLE [phone]) on 10/28/2013 6:41:08 PM      MDM   Final diagnoses:  Atrial fibrillation with RVR    Filed Vitals:   10/28/13 1755 10/28/13 1859  BP: 157/77   Temp: 98.9 F (37.2 C)   TempSrc: Oral   Resp: 20 20  Height: 5\' 11"  (1.803 m)   SpO2: 100% 98%    Medications  diltiazem (CARDIZEM) 1 mg/mL load via infusion 10 mg (not administered)    And  diltiazem (CARDIZEM) 100 mg in dextrose 5 % 100 mL (1 mg/mL) infusion (not administered)    [name] is a 73 y.o. male with extensive cardiac history presenting with severe chest pain at rest onset last night lasting about 3 hours. EKG shows new onset atrial fibrillation with rapid ventricular response. Vital signs stable with pulse ranging between 101 30s. Case discussed with cardiologist Dr. 65: Recommends Cardizem bolus and drip in  admission for discussion of anticoagulation.       Royann Shivers, PA-C 10/28/13 2334

## 2013-10-28 NOTE — ED Notes (Signed)
Patient transported to X-ray 

## 2013-10-28 NOTE — ED Notes (Signed)
Attempted report 

## 2013-10-28 NOTE — ED Notes (Signed)
CBG 82 

## 2013-10-28 NOTE — Progress Notes (Signed)
ANTICOAGULATION CONSULT NOTE - Initial Consult  Pharmacy Consult for heparin Indication: atrial fibrillation  Allergies  Allergen Reactions  . Ivp Dye [Iodinated Diagnostic Agents] Other (See Comments)    Increased blood pressure, heart rate    Patient Measurements: Height: 5\' 11"  (180.3 cm) IBW/kg (Calculated) : 75.3 Heparin Dosing Weight: 93 kg  Vital Signs: Temp: 98.9 F (37.2 C) (09/15 1755) Temp src: Oral (09/15 1755) BP: 131/67 mmHg (09/15 2100) Pulse Rate: 117 (09/15 2100)  Labs:  Recent Labs  10/28/13 1803 10/28/13 1830  HGB 12.4*  --   HCT 35.4*  --   PLT 268  --   APTT  --  26  LABPROT  --  13.4  INR  --  1.02  CREATININE 1.06  --     The CrCl is unknown because both a height and weight (above a minimum accepted value) are required for this calculation.   Medical History: Past Medical History  Diagnosis Date  . Coronary artery disease     a. h/o CABG 1997. b. stenting to graft-RCA, CB to LAD after LIMA in 2006. c. stenting to VG-RCA beyond initial stent and in-stent restenosis in 2011. c. Complex PCI 07/2013 to SVG-RCA and distal native RCA involving 4 sites (see cath report).  . Hypertension   . Hyperlipidemia   . Diabetes   . Heart murmur   . Chronic kidney disease   . Myocardial infarction   . GERD (gastroesophageal reflux disease)   . Arthritis   . Sleep apnea     hx of   . Barrett's esophagus   . Pre-syncope     a. 09/2012: in the setting of mild dehydration with significant straining possibly inducing a vagal event while he was dragging heavy logs.     Medications:  See home med list  Assessment: 73 year old man to be admitted to the hospital with new onset AFIB.  Heparin per pharmacy ordered. Goal of Therapy:  Heparin level 0.3-0.7 units/ml Monitor platelets by anticoagulation protocol: Yes   Plan:  Give 4000 units bolus x 1 Start heparin infusion at 1200 units/hr Check anti-Xa level in 6 hours and daily while on  heparin Continue to monitor H&H and platelets  65 10/28/2013,9:32 PM

## 2013-10-28 NOTE — ED Notes (Signed)
Pt reports he had some CP last night that has gone away now. sts that he still wasn't feeling right so he went to have his BP checked and told it was very high with systolic BP in 200s. Pt tried called cardiologist but the office was closed. Recent dx of 5 blockages, with multiple stent placement 1 month ago. Nad, skin warm and dry, resp e/u.

## 2013-10-28 NOTE — H&P (Signed)
History and Physical   Patient ID: SANJEEV [name] MRN: [id number], DOB/AGE: 1940/06/30 73 y.o. Date of Encounter: 10/28/2013  Primary Physician: Georgann Housekeeper, MD Primary Cardiologist: Dr. Tresa Endo  Chief Complaint:  Atrial fib  HPI: [name] is a 73 y.o. male with a history of CAD. He was in his usual state of health until yesterday. Last p.m., after dinner, he had onset of substernal/midepigastric chest discomfort, described as a dull ache. It reached a 4-5/10. It lasted several hours. It was not associated with shortness of breath, nausea, vomiting or diaphoresis. There was no radiation. He had some pickles at dinner and felt the pickles were responsible so he not take nitroglycerin at first. He finally took nitroglycerin and the pain eventually resolved. It has not returned. This is the first time he has had pain since his PCI in June 2015.  Today, he was feeling poorly, no specific complaints but he felt fatigued and had no energy. His wife took his blood pressure. His blood pressure was very high, systolic BP greater than 200. She took him to the fire department and they also noted a significantly elevated blood pressure as well as an extremely variable heart rate. They recommended he be evaluated and she brought him to the emergency room. In the emergency room he was in atrial fibrillation with rapid ventricular response.  His heart rate has improved on IV Cardizem. He has no awareness of a rapid or irregular heart rate. He is currently resting well, without complaints.   Past Medical History  Diagnosis Date  . Coronary artery disease     a. h/o CABG 1997. b. stenting to graft-RCA, CB to LAD after LIMA in 2006. c. stenting to VG-RCA beyond initial stent and in-stent restenosis in 2011. c. Complex PCI 07/2013 to SVG-RCA and distal native RCA involving 4 sites (see cath report).  . Hypertension   . Hyperlipidemia   . Diabetes   . Heart murmur   . Chronic kidney disease     . Myocardial infarction   . GERD (gastroesophageal reflux disease)   . Arthritis   . Sleep apnea     hx of   . Barrett's esophagus   . Pre-syncope     a. 09/2012: in the setting of mild dehydration with significant straining possibly inducing a vagal event while he was dragging heavy logs.     Surgical History:  Past Surgical History  Procedure Laterality Date  . Coronary angioplasty with stent placement    . Coronary artery bypass graft    . Joint replacement    . Coronary stent placement  07/30/2013    DES to RCA     DR KELLY     I have reviewed the patient's current medications. Prior to Admission medications   Medication Sig Start Date End Date Taking? Authorizing Provider  amLODipine (NORVASC) 5 MG tablet Take 5 mg by mouth daily.   Yes Historical Provider, MD  aspirin EC 81 MG tablet Take 81 mg by mouth daily.   Yes Historical Provider, MD  carvedilol (COREG) 25 MG tablet Take 25 mg by mouth 2 (two) times daily with a meal.   Yes Historical Provider, MD  clopidogrel (PLAVIX) 75 MG tablet Take 75 mg by mouth daily with lunch.   Yes Historical Provider, MD  folic acid (FOLVITE) 1 MG tablet Take 1 mg by mouth daily with lunch.    Yes Historical Provider, MD  glimepiride (AMARYL) 1 MG tablet Take 1  mg by mouth daily.  12/13/12  Yes Historical Provider, MD  losartan (COZAAR) 50 MG tablet Take 50 mg by mouth 2 (two) times daily.  07/10/13  Yes Historical Provider, MD  metFORMIN (GLUCOPHAGE) 1000 MG tablet Take 1,000 mg by mouth 2 (two) times daily with a meal.   Yes Historical Provider, MD  nitroGLYCERIN (NITROSTAT) 0.4 MG SL tablet Place 0.4 mg under the tongue every 5 (five) minutes as needed for chest pain.   Yes Historical Provider, MD  predniSONE (DELTASONE) 5 MG tablet Take 5 mg by mouth daily with breakfast.   Yes Historical Provider, MD   Scheduled Meds:  Continuous Infusions: . diltiazem (CARDIZEM) infusion 5 mg/hr (10/28/13 1914)   PRN Meds:.  Allergies:  Allergies   Allergen Reactions  . Ivp Dye [Iodinated Diagnostic Agents] Other (See Comments)    Increased blood pressure, heart rate    History   Social History  . Marital Status: Married    Spouse Name: N/A    Number of Children: 1  . Years of Education: N/A   Occupational History  . Retired    Social History [name] Topics  . Smoking status: Never Smoker   . Smokeless tobacco: Never Used  . Alcohol Use: Yes     Comment: 2-3 beers a month.  . Drug Use: No  . Sexual Activity: Not on file   Other Topics Concern  . Not on file   Social History Narrative   Lives with family    Family History  Problem Relation Age of Onset  . Heart attack Brother   . Heart disease Brother   . Diabetes Mother   . Heart disease Mother   . Hypertension Mother   . Cancer Father   . Diabetes Sister   . Hypertension Sister   . Heart disease Brother   . Diabetes Brother   . Colon cancer Neg Hx    Family Status  Relation Status Death Age  . Brother Deceased   . Mother Deceased   . Father Deceased     Review of Systems:   Full 14-point review of systems otherwise negative except as noted above.  Physical Exam: Blood pressure 136/69, pulse 121, temperature 98.9 F (37.2 C), temperature source Oral, resp. rate 20, height 5\' 11"  (1.803 m), SpO2 96.00%. General: Well developed, well nourished,male in no acute distress. Head: Normocephalic, atraumatic, sclera non-icteric, no xanthomas, nares are without discharge. Dentition: Good Neck: No carotid bruits. JVD not elevated. No thyromegally Lungs: Good expansion bilaterally. without wheezes or rhonchi.  Heart: Regular rate and rhythm with S1 S2.  No S3 or S4.  No murmur, no rubs, or gallops appreciated. Abdomen: Soft, non-tender, non-distended with normoactive bowel sounds. No hepatomegaly. No rebound/guarding. No obvious abdominal masses. Msk:  Strength and tone appear normal for age. No joint deformities or effusions, no spine or costo-vertebral angle  tenderness. Extremities: No clubbing or cyanosis. No edema.  Distal pedal pulses are 2+ in 4 extrem Neuro: Alert and oriented X 3. Moves all extremities spontaneously. No focal deficits noted. Psych:  Responds to questions appropriately with a normal affect. Skin: No rashes or lesions noted  Labs:   Lab Results  Component Value Date   WBC 8.3 10/28/2013   HGB 12.4* 10/28/2013   HCT 35.4* 10/28/2013   MCV 95.7 10/28/2013   PLT 268 10/28/2013    Recent Labs  10/28/13 1830  INR 1.02     Recent Labs Lab 10/28/13 1803 10/28/13 1830  NA 140  --  K 4.4  --   CL 103  --   CO2 22  --   BUN 26*  --   CREATININE 1.06  --   CALCIUM 10.2  --   PROT  --  6.8  BILITOT  --  0.3  ALKPHOS  --  78  ALT  --  13  AST  --  18  GLUCOSE 88  --     Recent Labs  10/28/13 1823  TROPIPOC 0.05    Radiology/Studies: Dg Chest 1 View 10/28/2013   CLINICAL DATA:  Chest pain  EXAM: CHEST - 1 VIEW  COMPARISON:  Multiple exams, including 07/28/2013  FINDINGS: Prior CABG. Bilateral shoulder prostheses. Heart size within normal limits. No edema. No pleural effusion. The lungs appear clear.  IMPRESSION: 1. Prior CABG.  No acute findings.   Electronically Signed   By: Herbie Baltimore M.D.   On: 10/28/2013 19:08     Cardiac Cath: See chart review notes, 07/30/2013  Echo: 04/04/2012 Conclusions - Left ventricle: The cavity size was normal. Wall thickness was increased in a pattern of moderate LVH. Systolic function was normal. The estimated ejection fraction was in the range of 55% to 60%. Wall motion was normal; there were no regional wall motion abnormalities. Left ventricular diastolic function parameters were normal. - Aortic valve: Trileaflet; mildly thickened, mildly calcified leaflets. Sclerosis without stenosis. - Mitral valve: Mild regurgitation. - Left atrium: The atrium was mildly dilated. - Right ventricle: The cavity size was severely dilated. Systolic pressure was increased. -  Atrial septum: No defect or patent foramen ovale was identified. - Pulmonary arteries: PA peak pressure: 32mm Hg (S). Impressions: - The right ventricular systolic pressure was increased consistent with mild pulmonary hypertension.   ECG: 10/28/2013 Atrial fibrillation with rapid ventricular response Lateral T wave inversions are new  ASSESSMENT AND PLAN:  Principal Problem:   Atrial fibrillation with rapid ventricular response - continue IV Cardizem for rate control. Duration is unknown. We'll start him on heparin, and continue his home beta blocker. M.D. to discuss with patient further management including consideration of TEE cardioversion versus rate control and anticoagulation. Check TSH. Hold Norvasc while on Cardizem.   Active Problems:   Unstable angina - cycle cardiac enzymes. Check echocardiogram. If cardiac enzymes elevate, heart catheterization in a.m. If cardiac enzymes remain negative, M.D. advise on stress testing versus heart cath. If heart catheterization needed, will pretreat for dye allergy.    Diabetes mellitus - hold Glucophage and Amaryl, use sliding scale insulin.    HYPERTENSION - blood pressure has improved with IV Cardizem, continue other home medications except hold Norvasc and decrease Cozaar to allow for up-titration of Cardizem if needed.    Hyperlipidemia with target LDL less than 70 - lipid profile showed elevated LDL in June, recheck in a.m.    Chronic kidney disease - monitor renal function carefully.  Will make him n.p.o. after midnight.  Signed, Theodore Demark, PA-C 10/28/2013 8:21 PM Beeper [phone]   I have seen and examined the patient along with Theodore Demark, PA-C.  I have reviewed the chart, notes and new data.  I agree with PA's note.  Key new complaints: first documented AFib, but he has had palpitations occasionally in the past Key examination changes: no clinical signs of CHF, rate well controlled Key new findings / data: ST  depression diffusely during tachycardia  PLAN: Rate control with IV diltiazem- transition to oral rate control meds in AM. Increase carvedilol to 37.5 mg BID, may  need to switch to metoprolol if BP too low. Reevaluate ECG when rate slower and recheck enzymes, but suspect angina and ECG changes are secondary to tachycardia and not a true ACS. If asymptomatic and well rate controlled in AM, may not need immediate cardioversion and can readdress after proper anticoagulation is instituted.  Thurmon Fair, MD, [facility] [phone] 10/28/2013, 8:53 PM

## 2013-10-29 DIAGNOSIS — I208 Other forms of angina pectoris: Secondary | ICD-10-CM

## 2013-10-29 DIAGNOSIS — Z8679 Personal history of other diseases of the circulatory system: Secondary | ICD-10-CM

## 2013-10-29 DIAGNOSIS — I517 Cardiomegaly: Secondary | ICD-10-CM

## 2013-10-29 DIAGNOSIS — I209 Angina pectoris, unspecified: Secondary | ICD-10-CM

## 2013-10-29 LAB — LIPID PANEL
CHOL/HDL RATIO: 5 ratio
CHOLESTEROL: 200 mg/dL (ref 0–200)
HDL: 40 mg/dL (ref >39)
LDL Cholesterol: 133 mg/dL — ABNORMAL HIGH (ref 0–99)
TRIGLYCERIDES: 134 mg/dL (ref <150)
VLDL: 27 mg/dL (ref 0–40)

## 2013-10-29 LAB — GLUCOSE, CAPILLARY
GLUCOSE-CAPILLARY: 312 mg/dL — AB (ref 70–99)
Glucose-Capillary: 80 mg/dL (ref 70–99)
Glucose-Capillary: 111 mg/dL — ABNORMAL HIGH (ref 70–99)

## 2013-10-29 LAB — HEPARIN LEVEL (UNFRACTIONATED)
HEPARIN UNFRACTIONATED: 0.67 [IU]/mL (ref 0.30–0.70)
Heparin Unfractionated: 0.57 IU/mL (ref 0.30–0.70)

## 2013-10-29 LAB — CBC
HEMATOCRIT: 33.6 % — AB (ref 39.0–52.0)
Hemoglobin: 11.6 g/dL — ABNORMAL LOW (ref 13.0–17.0)
MCH: 32.8 pg (ref 26.0–34.0)
MCHC: 34.5 g/dL (ref 30.0–36.0)
MCV: 94.9 fL (ref 78.0–100.0)
Platelets: 230 10*3/uL (ref 150–400)
RBC: 3.54 MIL/uL — ABNORMAL LOW (ref 4.22–5.81)
RDW: 13.9 % (ref 11.5–15.5)
WBC: 7.2 10*3/uL (ref 4.0–10.5)

## 2013-10-29 LAB — COMPREHENSIVE METABOLIC PANEL
ALK PHOS: 70 U/L (ref 39–117)
ALT: 11 U/L (ref 0–53)
ANION GAP: 15 (ref 5–15)
AST: 15 U/L (ref 0–37)
Albumin: 3.4 g/dL — ABNORMAL LOW (ref 3.5–5.2)
BUN: 22 mg/dL (ref 6–23)
CO2: 22 meq/L (ref 19–32)
Calcium: 9 mg/dL (ref 8.4–10.5)
Chloride: 105 mEq/L (ref 96–112)
Creatinine, Ser: 0.91 mg/dL (ref 0.50–1.35)
GFR, EST NON AFRICAN AMERICAN: 82 mL/min — AB (ref >90)
GLUCOSE: 86 mg/dL (ref 70–99)
POTASSIUM: 3.6 meq/L — AB (ref 3.7–5.3)
SODIUM: 142 meq/L (ref 137–147)
TOTAL PROTEIN: 6.3 g/dL (ref 6.0–8.3)
Total Bilirubin: 0.3 mg/dL (ref 0.3–1.2)

## 2013-10-29 LAB — HEMOGLOBIN A1C
Hgb A1c MFr Bld: 6.6 % — ABNORMAL HIGH (ref <5.7)
MEAN PLASMA GLUCOSE: 143 mg/dL — AB (ref <117)

## 2013-10-29 LAB — TROPONIN I
Troponin I: <0.3 ng/mL (ref <0.30)
Troponin I: <0.3 ng/mL (ref <0.30)
Troponin I: <0.3 ng/mL (ref <0.30)

## 2013-10-29 LAB — TSH: TSH: 1.02 u[IU]/mL (ref 0.350–4.500)

## 2013-10-29 MED ORDER — WARFARIN - PHARMACIST DOSING INPATIENT: Freq: Every day | Status: DC

## 2013-10-29 MED ORDER — DILTIAZEM HCL ER COATED BEADS 120 MG PO CP24
120.0000 mg | ORAL_CAPSULE | Freq: Every day | ORAL | Status: DC
  Administered 2013-10-29: 120 mg via ORAL
  Filled 2013-10-29: qty 1

## 2013-10-29 MED ORDER — COUMADIN BOOK
Freq: Once | Status: AC
  Administered 2013-10-29: 15:00:00
  Filled 2013-10-29: qty 1

## 2013-10-29 MED ORDER — CARVEDILOL 25 MG PO TABS: 37.5000 mg | ORAL_TABLET | Freq: Two times a day (BID) | ORAL | Status: DC

## 2013-10-29 MED ORDER — WARFARIN SODIUM 7.5 MG PO TABS
7.5000 mg | ORAL_TABLET | Freq: Once | ORAL | Status: AC
  Administered 2013-10-29: 7.5 mg via ORAL
  Filled 2013-10-29: qty 1

## 2013-10-29 MED ORDER — POTASSIUM CHLORIDE CRYS ER 20 MEQ PO TBCR
40.0000 meq | EXTENDED_RELEASE_TABLET | Freq: Once | ORAL | Status: AC
  Administered 2013-10-29: 40 meq via ORAL
  Filled 2013-10-29: qty 2

## 2013-10-29 MED ORDER — WARFARIN SODIUM 7.5 MG PO TABS: 7.5000 mg | ORAL_TABLET | Freq: Once | ORAL | Status: DC

## 2013-10-29 MED ORDER — DILTIAZEM HCL ER COATED BEADS 120 MG PO CP24: 120.0000 mg | ORAL_CAPSULE | Freq: Every day | ORAL | Status: DC

## 2013-10-29 NOTE — Progress Notes (Addendum)
Patient Profile: Cardiologist: Dr. Tresa Endo 73 y.o. male with a history of CAD s/p CABG x5 revascularization surgery with multiple PCI procedures since then. Last intervention was in June of this year involving stenting of the SVG-RCA. Presented back 10/28/13 with substernal/midepigastric chest discomfort, found to be in Afib with RVR.   CHADSVASc = 4 (HTN, AGE 3-74, DM, Vas Dz)  Subjective: No complaints. Denies chest pain, dyspnea and palpitations.   Objective: Vital signs in last 24 hours: Temp:  [97.5 F (36.4 C)-98.9 F (37.2 C)] 97.7 F (36.5 C) (09/16 0624) Pulse Rate:  [69-121] 80 (09/16 0624) Resp:  [20-24] 21 (09/16 0624) BP: (119-157)/(57-82) 126/62 mmHg (09/16 0624) SpO2:  [96 %-100 %] 96 % (09/16 0624) Weight:  [205 lb (92.987 kg)-207 lb 9.6 oz (94.167 kg)] 205 lb (92.987 kg) (09/16 0624) Last BM Date: 10/28/13  Intake/Output from previous day: 09/15 0701 - 09/16 0700 In: -  Out: 400 [Urine:400] Intake/Output this shift:    Medications Current Facility-Administered Medications  Medication Dose Route Frequency Provider Last Rate Last Dose  . 0.9 %  sodium chloride infusion  250 mL Intravenous PRN Rhonda G Barrett, PA-C      . acetaminophen (TYLENOL) tablet 650 mg  650 mg Oral Q4H PRN Rhonda G Barrett, PA-C      . ALPRAZolam Prudy Feeler) tablet 0.25 mg  0.25 mg Oral BID PRN Joline Salt Barrett, PA-C      . aspirin EC tablet 81 mg  81 mg Oral Daily Rhonda G Barrett, PA-C      . carvedilol (COREG) tablet 37.5 mg  37.5 mg Oral BID WC Mihai Croitoru, MD      . clopidogrel (PLAVIX) tablet 75 mg  75 mg Oral Q lunch Rhonda G Barrett, PA-C      . diltiazem (CARDIZEM) 100 mg in dextrose 5 % 100 mL (1 mg/mL) infusion  5-15 mg/hr Intravenous Continuous Nicole Pisciotta, PA-C 5 mL/hr at 10/28/13 1914 5 mg/hr at 10/28/13 1914  . folic acid (FOLVITE) tablet 1 mg  1 mg Oral Q lunch Rhonda G Barrett, PA-C      . heparin ADULT infusion 100 units/mL (25000 units/250 mL)  1,200 Units/hr  Intravenous Continuous Mihai Croitoru, MD 12 mL/hr at 10/28/13 2139 1,200 Units/hr at 10/28/13 2139  . insulin aspart (novoLOG) injection 0-15 Units  0-15 Units Subcutaneous TID WC Rhonda G Barrett, PA-C      . insulin aspart (novoLOG) injection 0-5 Units  0-5 Units Subcutaneous QHS Joline Salt Barrett, PA-C   2 Units at 10/28/13 2333  . losartan (COZAAR) tablet 25 mg  25 mg Oral BID Joline Salt Barrett, PA-C   25 mg at 10/28/13 2332  . nitroGLYCERIN (NITROSTAT) SL tablet 0.4 mg  0.4 mg Sublingual Q5 Min x 3 PRN Rhonda G Barrett, PA-C      . ondansetron (ZOFRAN) injection 4 mg  4 mg Intravenous Q6H PRN Rhonda G Barrett, PA-C      . predniSONE (DELTASONE) tablet 5 mg  5 mg Oral Q breakfast Rhonda G Barrett, PA-C      . sodium chloride 0.9 % injection 3 mL  3 mL Intravenous Q12H Rhonda G Barrett, PA-C      . sodium chloride 0.9 % injection 3 mL  3 mL Intravenous PRN Rhonda G Barrett, PA-C      . zolpidem (AMBIEN) tablet 5 mg  5 mg Oral QHS PRN Darrol Jump, PA-C        PE: General appearance: alert, cooperative and no  distress Neck: no carotid bruit and no JVD Lungs: clear to auscultation bilaterally Heart: irregularly irregular rhythm Extremities: no LEE Pulses: 2+ and symmetric Skin: warm and dry Neurologic: Grossly normal  Lab Results:   Recent Labs  10/28/13 1803 10/29/13 0446  WBC 8.3 7.2  HGB 12.4* 11.6*  HCT 35.4* 33.6*  PLT 268 230   BMET  Recent Labs  10/28/13 1803 10/29/13 0446  NA 140 142  K 4.4 3.6*  CL 103 105  CO2 22 22  GLUCOSE 88 86  BUN 26* 22  CREATININE 1.06 0.91  CALCIUM 10.2 9.0   PT/INR  Recent Labs  10/28/13 1830  LABPROT 13.4  INR 1.02   Cholesterol  Recent Labs  10/29/13 0446  CHOL 200   Cardiac Panel (last 3 results)  Recent Labs  10/28/13 2320 10/29/13 0446  TROPONINI <0.30 <0.30    Studies/Results: 2D Echo pending.    Assessment/Plan  Principal Problem:   Atrial fibrillation with rapid ventricular response Active  Problems:   Diabetes mellitus   HYPERTENSION   Hyperlipidemia with target LDL less than 70   Unstable angina   Chronic kidney disease   1. Atrial Fibrillation w/ RVR: TSH normal. Enzymes negative x 3. Resting HR better controlled in the 70s-90s. Currently on IV Cardizem and IV heparin. Will need transition to PO Cardizem today with 2hr IV overlap. 2D echo pending, would opt against PO Cardizem only if significant decreased systolic function. CHADSVASC score is 4. Will need either warfarin or a NOAC for stroke prophylaxis.   2. Chest Pain: likely secondary to rapid Afib. Cardiac enzymes negative x2. 2D echo pending.   3. HTN: well controlled. BP stable on IV Cardizem.    LOS: 1 day    [name] 10/29/2013 8:49 AM  Personally seen and examined. Agree with above. I will write orders for Coumadin per pharmacy dosing. His wife is on Coumadin. He understands the possible expense surrounding novel agents. I have also placed a phone call into Dr. Tresa Endo his primary cardiologist to make sure that he is in agreement with this. Once he is therapeutic on his Coumadin, he will stop his aspirin. Continue with Plavix. Recent stent procedure in June of 2015. Echocardiogram preliminary normal EF 50-55% with basal inferior akinesis-prior infarct. He feels better now but his rate is controlled. Troponin is normal. I would continue to monitor clinically at this point given his recent intervention. If necessary, outpatient nuclear stress test if symptoms return. He is currently on high-dose carvedilol 37.5 twice a day. Low-dose diltiazem drip seems to be controlling him well with only 2.2 second pause noted throughout the night. I will transition him to Cardizem CD 120 mg once a day.  If he is stable this afternoon from a heart rate perspective, it would not be unreasonable for him to be discharged with close clinical followup, INR check as new start Coumadin. It is not 100% necessary for  heparin bridge.  Hypokalemia-I. have written for potassium. Potassium 3.6.  [name], MD

## 2013-10-29 NOTE — Progress Notes (Signed)
ANTICOAGULATION CONSULT NOTE - Follow Up Consult  Pharmacy Consult:  Heparin / Coumadin Indication:  New onset Afib  Allergies  Allergen Reactions  . Ivp Dye [Iodinated Diagnostic Agents] Other (See Comments)    Increased blood pressure, heart rate    Patient Measurements: Height: 5\' 11"  (180.3 cm) Weight: 205 lb (92.987 kg) IBW/kg (Calculated) : 75.3 Heparin dosing weight = 93 kg  Vital Signs: Temp: 97.7 F (36.5 C) (09/16 0624) Temp src: Oral (09/16 0624) BP: 126/62 mmHg (09/16 0624) Pulse Rate: 80 (09/16 0624)  Labs:  Recent Labs  10/28/13 1803 10/28/13 1830 10/28/13 2320 10/29/13 0446  HGB 12.4*  --   --  11.6*  HCT 35.4*  --   --  33.6*  PLT 268  --   --  230  APTT  --  26  --   --   LABPROT  --  13.4  --   --   INR  --  1.02  --   --   HEPARINUNFRC  --   --   --  0.67  CREATININE 1.06  --   --  0.91  TROPONINI  --   --  <0.30 <0.30    Estimated Creatinine Clearance: 84.3 ml/min (by C-G formula based on Cr of 0.91).    Assessment: 52 YOM with a significant cardiac history admitted with chest pain, dyspnea and palpitations.  Found to have new-onset Afib and IV heparin was initiated.  Adding Coumadin today and INR is 1.02 at baseline.  Heparin level remains therapeutic.  No bleeding reported.   Goal of Therapy:  Heparin level 0.3-0.7 units/ml INR 2 - 3 Monitor platelets by anticoagulation protocol: Yes    Plan:  - Coumadin 7.5mg  PO today - Continue heparin gtt at 1200 units/hr - Daily HL / CBC / PT / INR if still here - Coumadin book - F/U ECHO    Dayshia Ballinas D. 03-22-1983, PharmD, BCPS Pager:  [phone] 10/29/2013, 12:43 PM

## 2013-10-29 NOTE — Discharge Instructions (Signed)

## 2013-10-29 NOTE — Progress Notes (Signed)
  Echocardiogram 2D Echocardiogram has been performed.  Arvil Chaco 10/29/2013, 9:37 AM

## 2013-10-29 NOTE — Care Management Note (Unsigned)
    Page 1 of 2   10/29/2013     5:02:26 PM CARE MANAGEMENT NOTE 10/29/2013  Patient:  [name], [name]   Account Number:  [id number]  Date Initiated:  10/29/2013  Documentation initiated by:  Tatum Corl  Subjective/Objective Assessment:   Pt adm with CP, Afib.  PTA, pt independent of ADLS.     Action/Plan:   Will follow for dc needs as pt progresses.   Anticipated DC Date:  10/31/2013   Anticipated DC Plan:  HOME/SELF CARE      DC Planning Services  CM consult  Medication Assistance      Choice offered to / List presented to:             Status of service:  In process, will continue to follow Medicare Important Message given?   (If response is "NO", the following Medicare IM given date fields will be blank) Date Medicare IM given:   Medicare IM given by:   Date Additional Medicare IM given:   Additional Medicare IM given by:    Discharge Disposition:    Per UR Regulation:  Reviewed for med. necessity/level of care/duration of stay  If discussed at Long Length of Stay Meetings, dates discussed:    Comments:  10/29/13 Sidney Ace, RN, BSN [phone]   S/[facility] @ [employer] [facility] # [phone]  1. XARELTO 20 MG DAILY 30 DAY COVER-YES CO-PAY- $159.38 30 DAY SUPPLY PRIOR APPROVAL - YES PHONE # [phone] FAX # [phone] PHARMACY: ANY STANDARD  2. ELIQUIS 5MG  BID FOR 30 DAY COVER-YES CO-PAY- $ 168.07 PRIOR APPROVAL - YES PHONE # [phone] FAX # [phone] PHARMACY; ANT STANDARD  * REASON SO HIGH : PATIENT HAS  A DEDUCTIBLE *

## 2013-10-29 NOTE — Progress Notes (Signed)
ANTICOAGULATION CONSULT NOTE - Follow Up Consult  Pharmacy Consult for Heparin  Indication: atrial fibrillation, new onset  Allergies  Allergen Reactions  . Ivp Dye [Iodinated Diagnostic Agents] Other (See Comments)    Increased blood pressure, heart rate    Patient Measurements: Height: 5\' 11"  (180.3 cm) Weight: 207 lb 9.6 oz (94.167 kg) IBW/kg (Calculated) : 75.3  Vital Signs: Temp: 97.5 F (36.4 C) (09/15 2207) Temp src: Oral (09/15 2207) BP: 153/82 mmHg (09/15 2207) Pulse Rate: 86 (09/15 2207)  Labs:  Recent Labs  10/28/13 1803 10/28/13 1830 10/28/13 2320 10/29/13 0446  HGB 12.4*  --   --  11.6*  HCT 35.4*  --   --  33.6*  PLT 268  --   --  230  APTT  --  26  --   --   LABPROT  --  13.4  --   --   INR  --  1.02  --   --   HEPARINUNFRC  --   --   --  0.67  CREATININE 1.06  --   --   --   TROPONINI  --   --  <0.30  --     Estimated Creatinine Clearance: 72.8 ml/min (by C-G formula based on Cr of 1.06).   Assessment: Therapeutic heparin level x 1, other labs as above.   Goal of Therapy:  Heparin level 0.3-0.7 units/ml Monitor platelets by anticoagulation protocol: Yes   Plan:  -Continue heparin at 1200 units/hr -1200 HL -Daily CBC/HL -Monitor for bleeding -F/U long term anti-coag plans  10/31/13 10/29/2013,5:44 AM

## 2013-10-29 NOTE — Progress Notes (Signed)
Discharge instructions, medications and follow up appointments reviewed with patient and family. Reviewed new medications with patient and family. All voice understanding  to teaching. To door via wheelchair.  Home via POV with his wife driving.

## 2013-10-29 NOTE — Discharge Summary (Signed)
Physician Discharge Summary  Patient ID: [name] MRN: [id number] DOB/AGE: Mar 09, 1940 73 y.o.  Admit date: 10/28/2013 Discharge date: 10/29/2013  Admission Diagnoses: Atrial Fibrillation w/ RVR  Discharge Diagnoses:  Principal Problem:   Atrial fibrillation with rapid ventricular response Active Problems:   Diabetes mellitus   HYPERTENSION   Hyperlipidemia with target LDL less than 70   Unstable angina   Chronic kidney disease   Discharged Condition: stable  Hospital Course: The patient is a 73 y/o male followed by Dr. Tresa Endo with an extensive history of coronary artery disease dating back to 1997 and is s/p CABGx5. In 2006 he underwent stenting of the midportion of the graft to the right coronary artery and cutting balloon to the LAD after the LIMA insertion. On New Year's Eve 2011 he developed an acute coronary syndrome and was found to have a new subtotal stenosis in the graft to right coronary artery more distal to the prior stent and also had in-stent narrowing to the proximal portion of the previously placed stent. He underwent successful intervention with a 3.5x18 mm stent. His LIMA to the LAD was patent. The graft to the circumflex was occluded. He had 90% stenosis in a small marginal vessel. His most recent intervention was 07/28/13, in which he underwent complex PCI involving the saphenous vein graft to the RCA and distal native RCA involving 4 sites with angiosculpt, PTCA, and stenting of the proximal vein graft and restenotic lesion in the proximal stent. EF was 50-55%. He also has a history of hypertension, type 2 diabetes mellitus, as well as hyperlipidemia.   He presented to [facility] on 10/28/13 with dyspnea, mild chest discomfort and fatigue. On arrival, he was noted to be in atrial fibrillation w/ RVR. Subsequently, he was placed on IV Cardizem and rate improved. IV heparin was also initiated. TSH was normal. Cardiac enzymes were negative x 3. He remained in atrial  fibrillation, however resting HR improved into the 90s and symptoms resolved. A 2D echo cardiogram was performed revealing normal systolic function with an EF of 55%. No valvular abnormalities noted. He was transitioned to PO Cardizem, 120 mg. His Coreg was also increased to 37.5 mg BID. His CHADSVASC was calculated at 4 (HTN, AGE 46-74, DM, Vas Dz). His primary cardiologist, Dr. Tresa Endo, was consulted on anticoagulation options and given cost, it was decided to place on Warfarin. However, heparin bridge was not felt necessary. Given his recent intervention, it was decided to continue ASA and Plavix, but to discontinue ASA once his INR reached therapeutic range. Pharmacy was consulted and it was recommended that he be discharged home on 7.5 mg daily with INR check in 5 days. Our office pharmacist was informed on plan to discontinue ASA once INR reaches therapeutic range. He was last seen and examined by Dr. Anne Fu who determined he was stable for discharge home. An INR check has been scheduled in our [facility] office. Post-hospital f/u will be arranged with Dr. Tresa Endo or an extender in 2 weeks.     Consults: None  Significant Diagnostic Studies:  2D echo 10/29/13 Study Conclusions  - Left ventricle: The cavity size was normal. Wall thickness was increased in a pattern of mild LVH. The estimated ejection fraction was 55%. There was basal inferior severe hypokinesis. Indeterminant diastolic function (atrial fibrillation). - Aortic valve: There was no stenosis. - Mitral valve: Mildly calcified annulus. There was no significant regurgitation. - Left atrium: The atrium was moderately dilated. - Right ventricle: The cavity size was normal.  Systolic function was normal. - Tricuspid valve: Peak RV-RA gradient (S): 12 mm Hg. - Pulmonary arteries: PA peak pressure: 15 mm Hg (S). - Inferior vena cava: The vessel was normal in size. The respirophasic diameter changes were in the normal range (>=  50%), consistent with normal central venous pressure.  Impressions:  - The patient was in atrial fibrillation. Technically difficult study with poor acoustic windows. Normal LV size with mild LV hypertrophy. EF 55% with basal inferior severe hypokinesis. Normal RV size and systolic function. No significant valvular abnormalities.    Treatments: See Hospital Course  Discharge Exam: Blood pressure 132/84, pulse 74, temperature 97.8 F (36.6 C), temperature source Oral, resp. rate 20, height 5\' 11"  (1.803 m), weight 205 lb (92.987 kg), SpO2 100.00%.   Disposition: 01-Home or Self Care      Discharge Instructions   Diet - low sodium heart healthy    Complete by:  As directed      Diet - low sodium heart healthy    Complete by:  As directed      Increase activity slowly    Complete by:  As directed      Increase activity slowly    Complete by:  As directed             Medication List    STOP taking these medications       amLODipine 5 MG tablet  Commonly known as:  NORVASC      TAKE these medications       aspirin EC 81 MG tablet  Take 81 mg by mouth daily.     carvedilol 25 MG tablet  Commonly known as:  COREG  Take 1.5 tablets (37.5 mg total) by mouth 2 (two) times daily with a meal.     clopidogrel 75 MG tablet  Commonly known as:  PLAVIX  Take 75 mg by mouth daily with lunch.     diltiazem 120 MG 24 hr capsule  Commonly known as:  CARDIZEM CD  Take 1 capsule (120 mg total) by mouth daily.     folic acid 1 MG tablet  Commonly known as:  FOLVITE  Take 1 mg by mouth daily with lunch.     glimepiride 1 MG tablet  Commonly known as:  AMARYL  Take 1 mg by mouth daily.     losartan 50 MG tablet  Commonly known as:  COZAAR  Take 50 mg by mouth 2 (two) times daily.     metFORMIN 1000 MG tablet  Commonly known as:  GLUCOPHAGE  Take 1,000 mg by mouth 2 (two) times daily with a meal.     nitroGLYCERIN 0.4 MG SL tablet  Commonly known as:  NITROSTAT   Place 0.4 mg under the tongue every 5 (five) minutes as needed for chest pain.     predniSONE 5 MG tablet  Commonly known as:  DELTASONE  Take 5 mg by mouth daily with breakfast.     warfarin 7.5 MG tablet  Commonly known as:  COUMADIN  Take 1 tablet (7.5 mg total) by mouth one time only at 6 PM.       Follow-up Information   Follow up with A, MD. (our office will call you for post-hosptial follow-up )    Specialty:  Cardiology   Contact information:   [address] [place] [phone]       Follow up with [facility] On 11/03/2013. (INR check (our office will call you  with an appointment time))    Contact information:   [address] [place] [address] [phone]     TIME SPENT ON DISCHARGE, INCLUDING PHYSICIAN TIME: >30 MINUTES  Signed: Robbie Lis 10/29/2013, 5:58 PM   Personally seen and examined. Agree with above. NOTE: on triple antithrombotic therapy. Discussed personally with Dr. Tresa Endo. Once therapeutic INR, stop ASA.     [name], MD

## 2013-10-31 NOTE — ED Provider Notes (Signed)
History/physical exam/procedure(s) were performed by non-physician practitioner and as supervising physician I was immediately available for consultation/collaboration. I have reviewed all notes and am in agreement with care and plan.   Hilario Quarry, MD 10/31/13 [phone]

## 2013-11-03 ENCOUNTER — Ambulatory Visit (INDEPENDENT_AMBULATORY_CARE_PROVIDER_SITE_OTHER): Payer: Medicare Other | Admitting: Pharmacist Clinician (PhC)/ Clinical Pharmacy Specialist

## 2013-11-03 DIAGNOSIS — Z7901 Long term (current) use of anticoagulants: Secondary | ICD-10-CM | POA: Insufficient documentation

## 2013-11-03 DIAGNOSIS — I4891 Unspecified atrial fibrillation: Secondary | ICD-10-CM

## 2013-11-03 LAB — POCT INR: INR: 2.7

## 2013-11-08 ENCOUNTER — Inpatient Hospital Stay (HOSPITAL_COMMUNITY)
Admission: EM | Admit: 2013-11-08 | Discharge: 2013-11-10 | DRG: 291 | Disposition: A | Discharge status: Home / Self Care | Payer: Medicare Other | Attending: Cardiovascular Disease | Admitting: Cardiovascular Disease

## 2013-11-08 ENCOUNTER — Emergency Department (HOSPITAL_COMMUNITY): Payer: Medicare Other

## 2013-11-08 ENCOUNTER — Encounter (HOSPITAL_COMMUNITY): Payer: Self-pay | Admitting: Emergency Medicine

## 2013-11-08 DIAGNOSIS — E669 Obesity, unspecified: Secondary | ICD-10-CM | POA: Diagnosis present

## 2013-11-08 DIAGNOSIS — N189 Chronic kidney disease, unspecified: Secondary | ICD-10-CM | POA: Diagnosis not present

## 2013-11-08 DIAGNOSIS — I252 Old myocardial infarction: Secondary | ICD-10-CM | POA: Diagnosis not present

## 2013-11-08 DIAGNOSIS — I509 Heart failure, unspecified: Secondary | ICD-10-CM | POA: Diagnosis present

## 2013-11-08 DIAGNOSIS — Z951 Presence of aortocoronary bypass graft: Secondary | ICD-10-CM

## 2013-11-08 DIAGNOSIS — Z7902 Long term (current) use of antithrombotics/antiplatelets: Secondary | ICD-10-CM | POA: Diagnosis not present

## 2013-11-08 DIAGNOSIS — I119 Hypertensive heart disease without heart failure: Secondary | ICD-10-CM | POA: Diagnosis present

## 2013-11-08 DIAGNOSIS — I5033 Acute on chronic diastolic (congestive) heart failure: Secondary | ICD-10-CM | POA: Diagnosis present

## 2013-11-08 DIAGNOSIS — E785 Hyperlipidemia, unspecified: Secondary | ICD-10-CM | POA: Diagnosis present

## 2013-11-08 DIAGNOSIS — Z6829 Body mass index (BMI) 29.0-29.9, adult: Secondary | ICD-10-CM | POA: Diagnosis not present

## 2013-11-08 DIAGNOSIS — Z9861 Coronary angioplasty status: Secondary | ICD-10-CM | POA: Diagnosis not present

## 2013-11-08 DIAGNOSIS — M129 Arthropathy, unspecified: Secondary | ICD-10-CM | POA: Diagnosis present

## 2013-11-08 DIAGNOSIS — K219 Gastro-esophageal reflux disease without esophagitis: Secondary | ICD-10-CM | POA: Diagnosis present

## 2013-11-08 DIAGNOSIS — K2270 Barrett's esophagus without dysplasia: Secondary | ICD-10-CM | POA: Diagnosis present

## 2013-11-08 DIAGNOSIS — E119 Type 2 diabetes mellitus without complications: Secondary | ICD-10-CM | POA: Diagnosis present

## 2013-11-08 DIAGNOSIS — Z7901 Long term (current) use of anticoagulants: Secondary | ICD-10-CM

## 2013-11-08 DIAGNOSIS — R0602 Shortness of breath: Secondary | ICD-10-CM | POA: Diagnosis not present

## 2013-11-08 DIAGNOSIS — Z8679 Personal history of other diseases of the circulatory system: Secondary | ICD-10-CM

## 2013-11-08 DIAGNOSIS — I130 Hypertensive heart and chronic kidney disease with heart failure and stage 1 through stage 4 chronic kidney disease, or unspecified chronic kidney disease: Principal | ICD-10-CM | POA: Diagnosis present

## 2013-11-08 DIAGNOSIS — G4733 Obstructive sleep apnea (adult) (pediatric): Secondary | ICD-10-CM | POA: Diagnosis present

## 2013-11-08 DIAGNOSIS — I2510 Atherosclerotic heart disease of native coronary artery without angina pectoris: Secondary | ICD-10-CM | POA: Diagnosis present

## 2013-11-08 DIAGNOSIS — I4891 Unspecified atrial fibrillation: Secondary | ICD-10-CM | POA: Diagnosis present

## 2013-11-08 DIAGNOSIS — I110 Hypertensive heart disease with heart failure: Secondary | ICD-10-CM

## 2013-11-08 LAB — CBC WITH DIFFERENTIAL/PLATELET
BASOS PCT: 1 % (ref 0–1)
Basophils Absolute: 0.1 10*3/uL (ref 0.0–0.1)
Eosinophils Absolute: 0.1 10*3/uL (ref 0.0–0.7)
Eosinophils Relative: 1 % (ref 0–5)
HCT: 35.2 % — ABNORMAL LOW (ref 39.0–52.0)
HEMOGLOBIN: 11.9 g/dL — AB (ref 13.0–17.0)
Lymphocytes Relative: 16 % (ref 12–46)
Lymphs Abs: 1.6 10*3/uL (ref 0.7–4.0)
MCH: 32.7 pg (ref 26.0–34.0)
MCHC: 33.8 g/dL (ref 30.0–36.0)
MCV: 96.7 fL (ref 78.0–100.0)
MONO ABS: 1.5 10*3/uL — AB (ref 0.1–1.0)
MONOS PCT: 15 % — AB (ref 3–12)
Neutro Abs: 7 10*3/uL (ref 1.7–7.7)
Neutrophils Relative %: 69 % (ref 43–77)
Platelets: 316 10*3/uL (ref 150–400)
RBC: 3.64 MIL/uL — ABNORMAL LOW (ref 4.22–5.81)
RDW: 14.3 % (ref 11.5–15.5)
WBC: 10.2 10*3/uL (ref 4.0–10.5)

## 2013-11-08 LAB — COMPREHENSIVE METABOLIC PANEL
ALBUMIN: 3.6 g/dL (ref 3.5–5.2)
ALT: 25 U/L (ref 0–53)
ANION GAP: 15 (ref 5–15)
AST: 19 U/L (ref 0–37)
Alkaline Phosphatase: 82 U/L (ref 39–117)
BUN: 26 mg/dL — AB (ref 6–23)
CO2: 22 mEq/L (ref 19–32)
CREATININE: 0.93 mg/dL (ref 0.50–1.35)
Calcium: 9.3 mg/dL (ref 8.4–10.5)
Chloride: 104 mEq/L (ref 96–112)
GFR calc Af Amer: >90 mL/min (ref >90)
GFR calc non Af Amer: 81 mL/min — ABNORMAL LOW (ref >90)
Glucose, Bld: 162 mg/dL — ABNORMAL HIGH (ref 70–99)
POTASSIUM: 4.3 meq/L (ref 3.7–5.3)
Sodium: 141 mEq/L (ref 137–147)
TOTAL PROTEIN: 6.6 g/dL (ref 6.0–8.3)
Total Bilirubin: 0.4 mg/dL (ref 0.3–1.2)

## 2013-11-08 LAB — I-STAT TROPONIN, ED: TROPONIN I, POC: 0 ng/mL (ref 0.00–0.08)

## 2013-11-08 LAB — GLUCOSE, CAPILLARY
GLUCOSE-CAPILLARY: 176 mg/dL — AB (ref 70–99)
Glucose-Capillary: 159 mg/dL — ABNORMAL HIGH (ref 70–99)

## 2013-11-08 LAB — PROTIME-INR
INR: 2.87 — AB (ref 0.00–1.49)
PROTHROMBIN TIME: 30.1 s — AB (ref 11.6–15.2)

## 2013-11-08 LAB — TROPONIN I

## 2013-11-08 LAB — PRO B NATRIURETIC PEPTIDE: PRO B NATRI PEPTIDE: 1341 pg/mL — AB (ref 0–125)

## 2013-11-08 MED ORDER — FOLIC ACID 1 MG PO TABS
1.0000 mg | ORAL_TABLET | Freq: Every day | ORAL | Status: DC
  Administered 2013-11-09 – 2013-11-10 (×2): 1 mg via ORAL
  Filled 2013-11-08 (×2): qty 1

## 2013-11-08 MED ORDER — ONDANSETRON HCL 4 MG/2ML IJ SOLN: 4.0000 mg | Freq: Four times a day (QID) | INTRAMUSCULAR | Status: DC | PRN

## 2013-11-08 MED ORDER — DILTIAZEM HCL 25 MG/5ML IV SOLN: 20.0000 mg | Freq: Once | INTRAVENOUS | Status: DC

## 2013-11-08 MED ORDER — ACETAMINOPHEN 325 MG PO TABS: 650.0000 mg | ORAL_TABLET | ORAL | Status: DC | PRN

## 2013-11-08 MED ORDER — GLIMEPIRIDE 1 MG PO TABS
1.0000 mg | ORAL_TABLET | Freq: Every day | ORAL | Status: DC
  Administered 2013-11-09: 1 mg via ORAL
  Filled 2013-11-08 (×3): qty 1

## 2013-11-08 MED ORDER — SODIUM CHLORIDE 0.9 % IV SOLN: 250.0000 mL | INTRAVENOUS | Status: DC | PRN

## 2013-11-08 MED ORDER — WARFARIN 1.25 MG HALF TABLET
3.2500 mg | ORAL_TABLET | Freq: Once | ORAL | Status: AC
  Administered 2013-11-08: 3.25 mg via ORAL
  Filled 2013-11-08: qty 1

## 2013-11-08 MED ORDER — INSULIN ASPART 100 UNIT/ML ~~LOC~~ SOLN
0.0000 [IU] | Freq: Three times a day (TID) | SUBCUTANEOUS | Status: DC
  Administered 2013-11-08: 3 [IU] via SUBCUTANEOUS
  Administered 2013-11-09 (×2): 2 [IU] via SUBCUTANEOUS

## 2013-11-08 MED ORDER — DILTIAZEM HCL 100 MG IV SOLR
5.0000 mg/h | Freq: Once | INTRAVENOUS | Status: AC
  Administered 2013-11-08: 5 mg/h via INTRAVENOUS

## 2013-11-08 MED ORDER — SODIUM CHLORIDE 0.9 % IJ SOLN: 3.0000 mL | INTRAMUSCULAR | Status: DC | PRN

## 2013-11-08 MED ORDER — CLOPIDOGREL BISULFATE 75 MG PO TABS
75.0000 mg | ORAL_TABLET | Freq: Every day | ORAL | Status: DC
Start: 2013-11-09 — End: 2013-11-10
  Administered 2013-11-09 – 2013-11-10 (×2): 75 mg via ORAL
  Filled 2013-11-08 (×2): qty 1

## 2013-11-08 MED ORDER — WARFARIN - PHARMACIST DOSING INPATIENT: Freq: Every day | Status: DC

## 2013-11-08 MED ORDER — DILTIAZEM HCL ER COATED BEADS 120 MG PO CP24
120.0000 mg | ORAL_CAPSULE | Freq: Two times a day (BID) | ORAL | Status: DC
  Administered 2013-11-08 – 2013-11-10 (×4): 120 mg via ORAL
  Filled 2013-11-08 (×7): qty 1

## 2013-11-08 MED ORDER — LOSARTAN POTASSIUM 50 MG PO TABS
50.0000 mg | ORAL_TABLET | Freq: Two times a day (BID) | ORAL | Status: DC
  Administered 2013-11-09 – 2013-11-10 (×4): 50 mg via ORAL
  Filled 2013-11-08 (×6): qty 1

## 2013-11-08 MED ORDER — METFORMIN HCL 500 MG PO TABS: 1000.0000 mg | ORAL_TABLET | Freq: Two times a day (BID) | ORAL | Status: DC

## 2013-11-08 MED ORDER — DILTIAZEM HCL 100 MG IV SOLR: 10.0000 mg/h | INTRAVENOUS | Status: DC

## 2013-11-08 MED ORDER — SODIUM CHLORIDE 0.9 % IJ SOLN
3.0000 mL | Freq: Two times a day (BID) | INTRAMUSCULAR | Status: DC
  Administered 2013-11-09: 3 mL via INTRAVENOUS
  Administered 2013-11-09: 21:00:00 via INTRAVENOUS
  Administered 2013-11-09 – 2013-11-10 (×2): 3 mL via INTRAVENOUS

## 2013-11-08 MED ORDER — NITROGLYCERIN 0.4 MG SL SUBL: 0.4000 mg | SUBLINGUAL_TABLET | SUBLINGUAL | Status: DC | PRN

## 2013-11-08 MED ORDER — EZETIMIBE-SIMVASTATIN 10-20 MG PO TABS
1.0000 | ORAL_TABLET | Freq: Every day | ORAL | Status: DC
  Administered 2013-11-08 – 2013-11-09 (×2): 1 via ORAL
  Filled 2013-11-08 (×3): qty 1

## 2013-11-08 MED ORDER — DILTIAZEM HCL 25 MG/5ML IV SOLN
10.0000 mg | Freq: Once | INTRAVENOUS | Status: AC
Start: 2013-11-08 — End: 2013-11-08
  Administered 2013-11-08: 10 mg via INTRAVENOUS
  Filled 2013-11-08: qty 5

## 2013-11-08 MED ORDER — PREDNISONE 5 MG PO TABS
5.0000 mg | ORAL_TABLET | Freq: Every day | ORAL | Status: DC
  Administered 2013-11-09 – 2013-11-10 (×2): 5 mg via ORAL
  Filled 2013-11-08 (×3): qty 1

## 2013-11-08 MED ORDER — CARVEDILOL 25 MG PO TABS
37.5000 mg | ORAL_TABLET | Freq: Two times a day (BID) | ORAL | Status: DC
  Administered 2013-11-08 – 2013-11-10 (×4): 37.5 mg via ORAL
  Filled 2013-11-08 (×7): qty 1

## 2013-11-08 MED ORDER — METFORMIN HCL 500 MG PO TABS
1000.0000 mg | ORAL_TABLET | Freq: Two times a day (BID) | ORAL | Status: DC
  Administered 2013-11-08 – 2013-11-09 (×3): 1000 mg via ORAL
  Filled 2013-11-08 (×6): qty 2

## 2013-11-08 NOTE — ED Notes (Signed)
Dr. Tilley at the bedside.  

## 2013-11-08 NOTE — ED Notes (Signed)
Pt. Stated, I was at [facility] and started feeling like I couldn't breath so we came home cause i didn't want to go to that hospial.

## 2013-11-08 NOTE — H&P (Signed)
History and Physical   Admit date: 11/08/2013 Name:  [name] Medical record number: [id number] DOB/Age:  1940/12/17  73 y.o. male  Referring Physician:  [facility]  Primary Cardiologist:  Dr. Daphene Jaeger  Chief complaint/reason for admission: Shortness of breath and chest pain  HPI:  This 73 year old male has a history of complex coronary artery disease as detailed below, hypertension, diabetes and obesity as well as sleep apnea. He was admitted to the hospital recently with rapid atrial fibrillation was stabilized on diltiazem and sent home on Coumadin. He was feeling well after discharge and went to the beach this past Wednesday. There is a question as to whether he was taking his medicines properly or not. Yesterday he and his family to a winery and he had one half glass of wine and afterwards had some sharp flank pains. Last evening he awoke with dyspnea and some chest tightness and this morning the fire department checked him at [facility] and advised him to go to the emergency room. His family drove him back to [facility] he presented to the emergency room here where he was found to be in rapid atrial fibrillation. He was placed on diltiazem with slowing of his rate and is readmitted at this time. Since discharge prior to last night has not had chest pain. Prior to last night he had no PND, orthopnea or significant edema. No bleeding complications from warfarin.    Past Medical History  Diagnosis Date  . Coronary artery disease     a. h/o CABG 1997. b. stenting to graft-RCA, CB to LAD after LIMA in 2006. c. stenting to VG-RCA beyond initial stent and in-stent restenosis in 2011. c. Complex PCI 07/2013 to SVG-RCA and distal native RCA involving 4 sites (see cath report).  . Hypertension   . Hyperlipidemia   . Diabetes   . Chronic kidney disease   . Myocardial infarction   . GERD (gastroesophageal reflux disease)   . Arthritis   . Sleep apnea     hx  of   . Barrett's esophagus   . Pre-syncope     a. 09/2012: in the setting of mild dehydration with significant straining possibly inducing a vagal event while he was dragging heavy logs.      Past Surgical History  Procedure Laterality Date  . Coronary angioplasty with stent placement    . Coronary artery bypass graft    . Joint replacement    . Coronary stent placement  07/30/2013    DES to RCA     DR KELLY  . Hernia repair    . Shoulder surgery    . Kidney stone surgery     Allergies: is allergic to ivp dye.   Medications: Prior to Admission medications   Medication Sig Start Date End Date Taking? Authorizing Provider  carvedilol (COREG) 25 MG tablet Take 1.5 tablets (37.5 mg total) by mouth 2 (two) times daily with a meal. 10/29/13  Yes Brittainy Sherlynn Carbon, PA-C  clopidogrel (PLAVIX) 75 MG tablet Take 75 mg by mouth daily with lunch.   Yes Historical Provider, MD  diltiazem (CARDIZEM CD) 120 MG 24 hr capsule Take 1 capsule (120 mg total) by mouth daily. 10/29/13  Yes Brittainy Sherlynn Carbon, PA-C  ezetimibe-simvastatin (VYTORIN) 10-20 MG per tablet Take 1 tablet by mouth daily.   Yes Historical Provider, MD  folic acid (FOLVITE) 1 MG tablet Take 1 mg by mouth daily with lunch.    Yes Historical  Provider, MD  glimepiride (AMARYL) 1 MG tablet Take 1 mg by mouth daily.  12/13/12  Yes Historical Provider, MD  losartan (COZAAR) 50 MG tablet Take 50 mg by mouth 2 (two) times daily.  07/10/13  Yes Historical Provider, MD  metFORMIN (GLUCOPHAGE) 1000 MG tablet Take 1,000 mg by mouth 2 (two) times daily with a meal.   Yes Historical Provider, MD  nitroGLYCERIN (NITROSTAT) 0.4 MG SL tablet Place 0.4 mg under the tongue every 5 (five) minutes as needed for chest pain.   Yes Historical Provider, MD  predniSONE (DELTASONE) 5 MG tablet Take 5 mg by mouth daily with breakfast.   Yes Historical Provider, MD  warfarin (COUMADIN) 7.5 MG tablet Take 3.25-7.5 mg by mouth daily. Alternate between 3.25mg  and  7.5mg  daily   Yes Historical Provider, MD   Family History:  Family Status  Relation Status Death Age  . Brother Deceased   . Mother Deceased   . Father Deceased    Social History:   reports that he has never smoked. He has never used smokeless tobacco. He reports that he drinks alcohol. He reports that he does not use illicit drugs.   History   Social History Narrative   Lives with family     Review of Systems: Has been obese for several years. He has has some intermittent indigestion. He has mild dyspnea with exertion. Does have a moderate amount of arthritis and takes prednisone on a daily basis. No recent bleeding complications. Other than as noted above, the remainder of the review of systems is normal  Physical Exam: BP 151/78  Pulse 66  Temp(Src) 97.6 F (36.4 C) (Oral)  Resp 25  Ht 5\' 11"  (1.803 m)  Wt 92.534 kg (204 lb)  BMI 28.46 kg/m2  SpO2 95% General appearance: Pleasant obese male currently in no acute distress Head: Normocephalic, without obvious abnormality, atraumatic, Balding male hair pattern Eyes: conjunctivae/corneas clear. PERRL, EOM's intact. Fundi benign. Neck: no adenopathy, no carotid bruit, no JVD and supple, symmetrical, trachea midline Lungs: clear to auscultation bilaterally and Healed median sternotomy scar Heart: Irregular rhythm, normal S1-S2, no S3 Abdomen: soft, non-tender; bowel sounds normal; no masses,  no organomegaly Male genitalia: deferred Rectal: deferred Extremities: Prior saphenous harvesting scar noted Pulses: 2+ and symmetric Neurologic: Grossly normal   Labs: CBC  Recent Labs  11/08/13 1215  WBC 10.2  RBC 3.64*  HGB 11.9*  HCT 35.2*  PLT 316  MCV 96.7  MCH 32.7  MCHC 33.8  RDW 14.3  LYMPHSABS 1.6  MONOABS 1.5*  EOSABS 0.1  BASOSABS 0.1   CMP   Recent Labs  11/08/13 1215  NA 141  K 4.3  CL 104  CO2 22  GLUCOSE 162*  BUN 26*  CREATININE 0.93  CALCIUM 9.3  PROT 6.6  ALBUMIN 3.6  AST 19  ALT  25  ALKPHOS 82  BILITOT 0.4  GFRNONAA 81*  GFRAA >90   BNP (last 3 results)  Recent Labs  11/08/13 1215  PROBNP 1341.0*   Cardiac Panel (last 3 results) No results found for this basename: CKTOTAL, CKMB, TROPONINI, RELINDX,  in the last 72 hours Thyroid  Lab Results  Component Value Date   TSH 1.020 10/28/2013   T3TOTAL 113.7 02/13/2009   T4TOTAL 9.3 02/13/2009    EKG: Atrial fibrillation with rapid ventricular response, T wave inversions consistent with LVH or ischemia  Radiology: Cardiomegaly with interstitial pleural effusions   IMPRESSIONS: 1. Recurrent atrial fibrillation with rapid ventricular response 2.  Acute on chronic diastolic congestive heart failure 3. Coronary artery disease with previous stenting and previous bypass grafting and old inferior infarction 4. Diabetes mellitus with vascular disease 5. Obesity 6. Long-term anticoagulation with warfarin  7. Hypertension somewhat elevated  PLAN: Continue intravenous venous diltiazem overnight. Intravenous diuresis with elevation of BNP and interstitial congestion. The question is whether he has been taking his medicines properly at home or not. May need a higher dose of diltiazem later discharge  Signed: W. Ashley Royalty MD [facility]  11/08/2013, 2:55 PM

## 2013-11-08 NOTE — Progress Notes (Signed)
ANTICOAGULATION CONSULT NOTE - Initial Consult  Pharmacy Consult for Coumadin Indication: atrial fibrillation  Allergies  Allergen Reactions  . Ivp Dye [Iodinated Diagnostic Agents] Other (See Comments)    Increased blood pressure, heart rate    Patient Measurements: Height: 5\' 11"  (180.3 cm) Weight: 204 lb (92.534 kg) IBW/kg (Calculated) : 75.3  Vital Signs: Temp: 98.8 F (37.1 C) (09/26 1707) Temp src: Oral (09/26 1707) BP: 129/57 mmHg (09/26 1715) Pulse Rate: 90 (09/26 1715)  Labs:  Recent Labs  11/08/13 1215  HGB 11.9*  HCT 35.2*  PLT 316  LABPROT 30.1*  INR 2.87*  CREATININE 0.93    Estimated Creatinine Clearance: 82.2 ml/min (by C-G formula based on Cr of 0.93).   Medical History: Past Medical History  Diagnosis Date  . Coronary artery disease     a. h/o CABG 1997. b. stenting to graft-RCA, CB to LAD after LIMA in 2006. c. stenting to VG-RCA beyond initial stent and in-stent restenosis in 2011. c. Complex PCI 07/2013 to SVG-RCA and distal native RCA involving 4 sites (see cath report).  . Hypertension   . Hyperlipidemia   . Diabetes   . Chronic kidney disease   . Myocardial infarction   . GERD (gastroesophageal reflux disease)   . Arthritis   . Sleep apnea     hx of   . Barrett's esophagus   . Pre-syncope     a. 09/2012: in the setting of mild dehydration with significant straining possibly inducing a vagal event while he was dragging heavy logs.     Medications:  Prescriptions prior to admission  Medication Sig Dispense Refill  . carvedilol (COREG) 25 MG tablet Take 1.5 tablets (37.5 mg total) by mouth 2 (two) times daily with a meal.  90 tablet  5  . clopidogrel (PLAVIX) 75 MG tablet Take 75 mg by mouth daily with lunch.      . diltiazem (CARDIZEM CD) 120 MG 24 hr capsule Take 1 capsule (120 mg total) by mouth daily.  30 capsule  5  . ezetimibe-simvastatin (VYTORIN) 10-20 MG per tablet Take 1 tablet by mouth daily.      . folic acid (FOLVITE) 1  MG tablet Take 1 mg by mouth daily with lunch.       . glimepiride (AMARYL) 1 MG tablet Take 1 mg by mouth daily.       10/2012 losartan (COZAAR) 50 MG tablet Take 50 mg by mouth 2 (two) times daily.       . metFORMIN (GLUCOPHAGE) 1000 MG tablet Take 1,000 mg by mouth 2 (two) times daily with a meal.      . nitroGLYCERIN (NITROSTAT) 0.4 MG SL tablet Place 0.4 mg under the tongue every 5 (five) minutes as needed for chest pain.      . predniSONE (DELTASONE) 5 MG tablet Take 5 mg by mouth daily with breakfast.      . warfarin (COUMADIN) 7.5 MG tablet Take 3.25-7.5 mg by mouth daily. Alternate between 3.25mg  and 7.5mg  daily        Assessment: 73 year old male admitted with Afib with RVR.  He was started on Coumadin during his last hospital admission and his INR is currently therapeutic.  Goal of Therapy:  INR 2-3   Plan:  Give his home dose of Coumadin 3.25mg  today Daily PT/INR  64, Pharm.D., BCPS, AAHIVP Clinical Pharmacist Phone: [phone] or [phone] 11/08/2013, 5:49 PM

## 2013-11-08 NOTE — Progress Notes (Signed)
Patient ambulated to bathroom became short of breath, perfuse sweating, and dizziness. Patient stated he felt like passing out. CCMD notified RN that patient HR atrial fibrillation in the 30's-40's, Patient B/P 105/61, O2 sat 94% RA and CBG 176.  After about 5-10 minutes patient remains short of breath, RN placed patient on 2L O2  Nasal Cannula for comfort. Patient stated some relief with O2. Dr.Hochrein made aware,no new orders given. Patient now resting. Will continue to monitor patient.

## 2013-11-08 NOTE — ED Notes (Signed)
Attempted report 

## 2013-11-08 NOTE — ED Provider Notes (Signed)
CSN: [id number]     Arrival date & time 11/08/13  1140 History   First MD Initiated Contact with Patient 11/08/13 1200     Chief Complaint  Patient presents with  . Chest Pain  . Shortness of Breath     (Consider location/radiation/quality/duration/timing/severity/associated sxs/prior Treatment) The history is provided by the patient.  [name] is a 73 y.o. male hx of CAD s/p CABG and multiple stents, afib on coumadin here with shortness of breath, chest pressure. Was admitted recently for rapid afib. Was started on cardizem CD and coumadin. Went to the beach recently and started having shortness of breath, chest pressure. Went to fire department and was told to go to ED but drove here instead.    Past Medical History  Diagnosis Date  . Coronary artery disease     a. h/o CABG 1997. b. stenting to graft-RCA, CB to LAD after LIMA in 2006. c. stenting to VG-RCA beyond initial stent and in-stent restenosis in 2011. c. Complex PCI 07/2013 to SVG-RCA and distal native RCA involving 4 sites (see cath report).  . Hypertension   . Hyperlipidemia   . Diabetes   . Heart murmur   . Chronic kidney disease   . Myocardial infarction   . GERD (gastroesophageal reflux disease)   . Arthritis   . Sleep apnea     hx of   . Barrett's esophagus   . Pre-syncope     a. 09/2012: in the setting of mild dehydration with significant straining possibly inducing a vagal event while he was dragging heavy logs.    Past Surgical History  Procedure Laterality Date  . Coronary angioplasty with stent placement    . Coronary artery bypass graft    . Joint replacement    . Coronary stent placement  07/30/2013    DES to RCA     DR KELLY   Family History  Problem Relation Age of Onset  . Heart attack Brother   . Heart disease Brother   . Diabetes Mother   . Heart disease Mother   . Hypertension Mother   . Cancer Father   . Diabetes Sister   . Hypertension Sister   . Heart disease Brother   .  Diabetes Brother   . Colon cancer Neg Hx    History  Substance Use Topics  . Smoking status: Never Smoker   . Smokeless tobacco: Never Used  . Alcohol Use: Yes     Comment: 2-3 beers a month.    Review of Systems  Respiratory: Positive for shortness of breath.   Cardiovascular: Positive for chest pain.  All other systems reviewed and are negative.     Allergies  Ivp dye  Home Medications   Prior to Admission medications   Medication Sig Start Date End Date Taking? Authorizing Provider  carvedilol (COREG) 25 MG tablet Take 1.5 tablets (37.5 mg total) by mouth 2 (two) times daily with a meal. 10/29/13  Yes Brittainy Sherlynn Carbon, PA-C  clopidogrel (PLAVIX) 75 MG tablet Take 75 mg by mouth daily with lunch.   Yes Historical Provider, MD  diltiazem (CARDIZEM CD) 120 MG 24 hr capsule Take 1 capsule (120 mg total) by mouth daily. 10/29/13  Yes Brittainy Sherlynn Carbon, PA-C  ezetimibe-simvastatin (VYTORIN) 10-20 MG per tablet Take 1 tablet by mouth daily.   Yes Historical Provider, MD  folic acid (FOLVITE) 1 MG tablet Take 1 mg by mouth daily with lunch.    Yes Historical Provider, MD  glimepiride (AMARYL) 1 MG tablet Take 1 mg by mouth daily.  12/13/12  Yes Historical Provider, MD  losartan (COZAAR) 50 MG tablet Take 50 mg by mouth 2 (two) times daily.  07/10/13  Yes Historical Provider, MD  metFORMIN (GLUCOPHAGE) 1000 MG tablet Take 1,000 mg by mouth 2 (two) times daily with a meal.   Yes Historical Provider, MD  nitroGLYCERIN (NITROSTAT) 0.4 MG SL tablet Place 0.4 mg under the tongue every 5 (five) minutes as needed for chest pain.   Yes Historical Provider, MD  predniSONE (DELTASONE) 5 MG tablet Take 5 mg by mouth daily with breakfast.   Yes Historical Provider, MD  warfarin (COUMADIN) 7.5 MG tablet Take 3.25-7.5 mg by mouth daily. Alternate between 3.25mg  and 7.5mg  daily   Yes Historical Provider, MD   BP 138/76  Pulse 92  Temp(Src) 97.6 F (36.4 C) (Oral)  Resp 24  Ht 5\' 11"  (1.803 m)   Wt 204 lb (92.534 kg)  BMI 28.46 kg/m2  SpO2 95% Physical Exam  Nursing note and vitals reviewed. Constitutional: He is oriented to person, place, and time.  Chronically ill, slightly uncomfortable   HENT:  Head: Normocephalic.  Mouth/Throat: Oropharynx is clear and moist.  Eyes: Conjunctivae are normal. Pupils are equal, round, and reactive to light.  Neck: Normal range of motion. Neck supple.  Cardiovascular:  Tachy, irregular   Pulmonary/Chest: Effort normal and breath sounds normal. No respiratory distress. He has no wheezes. He has no rales.  Abdominal: Soft. Bowel sounds are normal. He exhibits no distension. There is no tenderness. There is no rebound and no guarding.  Musculoskeletal: Normal range of motion. He exhibits no edema and no tenderness.  Neurological: He is alert and oriented to person, place, and time. No cranial nerve deficit. Coordination normal.  Skin: Skin is warm and dry.  Psychiatric: He has a normal mood and affect. His behavior is normal. Judgment and thought content normal.    ED Course  Procedures (including critical care time)  CRITICAL CARE Performed by: Silverio Lay, Rejina Odle   Total critical care time: 30 min   Critical care time was exclusive of separately billable procedures and treating other patients.  Critical care was necessary to treat or prevent imminent or life-threatening deterioration.  Critical care was time spent personally by me on the following activities: development of treatment plan with patient and/or surrogate as well as nursing, discussions with consultants, evaluation of patient's response to treatment, examination of patient, obtaining history from patient or surrogate, ordering and performing treatments and interventions, ordering and review of laboratory studies, ordering and review of radiographic studies, pulse oximetry and re-evaluation of patient's condition.   Labs Review Labs Reviewed  CBC WITH DIFFERENTIAL - Abnormal;  Notable for the following:    RBC 3.64 (*)    Hemoglobin 11.9 (*)    HCT 35.2 (*)    Monocytes Relative 15 (*)    Monocytes Absolute 1.5 (*)    All other components within normal limits  COMPREHENSIVE METABOLIC PANEL - Abnormal; Notable for the following:    Glucose, Bld 162 (*)    BUN 26 (*)    GFR calc non Af Amer 81 (*)    All other components within normal limits  PROTIME-INR - Abnormal; Notable for the following:    Prothrombin Time 30.1 (*)    INR 2.87 (*)    All other components within normal limits  PRO B NATRIURETIC PEPTIDE  I-STAT TROPOININ, ED    Imaging Review Dg Chest  Portable 1 View  11/08/2013   CLINICAL DATA:  Sharp bilateral chest pain. Shortness of breath since 2 o'clock this morning. History of open-heart surgery, hypertension and diabetes.  EXAM: PORTABLE CHEST - 1 VIEW  COMPARISON:  10/28/2013  FINDINGS: The patient has had median sternotomy and CABG. The heart is enlarged. There are bilateral pleural effusions. There are Kerley B-lines at the lung bases consistent with interstitial edema. There are no focal consolidations. Patient has had bilateral shoulder arthroplasty. There are degenerative changes in the shoulder joints bilaterally.  IMPRESSION: 1. Cardiomegaly and interstitial edema/pleural effusions. 2. No consolidations. 3. Postoperative and degenerative changes in the shoulders.   Electronically Signed   By: Rosalie Gums M.D.   On: 11/08/2013 13:06     EKG Interpretation   Date/Time:  Saturday November 08 2013 11:43:59 EDT Ventricular Rate:  116 PR Interval:    QRS Duration: 94 QT Interval:  332 QTC Calculation: 461 R Axis:   83 Text Interpretation:  Atrial fibrillation with rapid ventricular response  with premature ventricular or aberrantly conducted complexes Incomplete  right bundle branch block ST \\T \ T wave abnormality, consider  inferolateral ischemia or digitalis effect Abnormal ECG rate faster than  previous  Confirmed by Bettie Capistran  MD, Rome Schlauch  ([id number]) on 11/08/2013 12:01:12 PM      MDM   Final diagnoses:  None    MICIAH COVELLI is a 73 y.o. male here with rapid afib. Will give cardizem. Given significant CAD, will consult cardiology.   1: 30 PM Given cardizem 10 mg IV. Still tachy, started on cardizem drip. Called Dr. Donnie Aho to evaluate patient.   3:02 PM Dr. Donnie Aho will admit on tele for rapid afib.     Richardean Canal, MD 11/08/13 [phone]

## 2013-11-09 DIAGNOSIS — I5033 Acute on chronic diastolic (congestive) heart failure: Secondary | ICD-10-CM | POA: Diagnosis present

## 2013-11-09 LAB — BASIC METABOLIC PANEL
Anion gap: 16 — ABNORMAL HIGH (ref 5–15)
BUN: 27 mg/dL — AB (ref 6–23)
CALCIUM: 9.1 mg/dL (ref 8.4–10.5)
CO2: 21 mEq/L (ref 19–32)
CREATININE: 1.04 mg/dL (ref 0.50–1.35)
Chloride: 103 mEq/L (ref 96–112)
GFR calc Af Amer: 80 mL/min — ABNORMAL LOW (ref >90)
GFR calc non Af Amer: 69 mL/min — ABNORMAL LOW (ref >90)
GLUCOSE: 126 mg/dL — AB (ref 70–99)
Potassium: 4.1 mEq/L (ref 3.7–5.3)
Sodium: 140 mEq/L (ref 137–147)

## 2013-11-09 LAB — GLUCOSE, CAPILLARY
GLUCOSE-CAPILLARY: 132 mg/dL — AB (ref 70–99)
Glucose-Capillary: 138 mg/dL — ABNORMAL HIGH (ref 70–99)
Glucose-Capillary: 94 mg/dL (ref 70–99)
Glucose-Capillary: 133 mg/dL — ABNORMAL HIGH (ref 70–99)

## 2013-11-09 LAB — PROTIME-INR
INR: 2.75 — ABNORMAL HIGH (ref 0.00–1.49)
Prothrombin Time: 29.1 seconds — ABNORMAL HIGH (ref 11.6–15.2)

## 2013-11-09 LAB — TROPONIN I
Troponin I: <0.3 ng/mL (ref <0.30)
Troponin I: <0.3 ng/mL (ref <0.30)

## 2013-11-09 LAB — HEMOGLOBIN A1C
HEMOGLOBIN A1C: 6.6 % — AB (ref <5.7)
Mean Plasma Glucose: 143 mg/dL — ABNORMAL HIGH (ref <117)

## 2013-11-09 MED ORDER — WARFARIN SODIUM 2 MG PO TABS
3.2500 mg | ORAL_TABLET | Freq: Once | ORAL | Status: AC
  Administered 2013-11-09: 3.25 mg via ORAL
  Filled 2013-11-09: qty 1

## 2013-11-09 MED ORDER — OFF THE BEAT BOOK
Freq: Once | Status: AC
  Administered 2013-11-09: 21:00:00
  Filled 2013-11-09: qty 1

## 2013-11-09 MED ORDER — FUROSEMIDE 10 MG/ML IJ SOLN
40.0000 mg | Freq: Two times a day (BID) | INTRAMUSCULAR | Status: AC
  Administered 2013-11-09 (×2): 40 mg via INTRAVENOUS
  Filled 2013-11-09 (×2): qty 4

## 2013-11-09 NOTE — Progress Notes (Addendum)
ANTICOAGULATION CONSULT NOTE - Initial Consult  Pharmacy Consult for Coumadin Indication: atrial fibrillation  Allergies  Allergen Reactions  . Ivp Dye [Iodinated Diagnostic Agents] Other (See Comments)    Increased blood pressure, heart rate    Patient Measurements: Height: 5\' 11"  (180.3 cm) Weight: 209 lb 4.8 oz (94.938 kg) IBW/kg (Calculated) : 75.3  Vital Signs: Temp: 98.3 F (36.8 C) (09/27 0500) BP: 147/83 mmHg (09/27 0735) Pulse Rate: 125 (09/27 0735)  Labs:  Recent Labs  11/08/13 1215 11/08/13 1815 11/08/13 2345 11/09/13 0514  HGB 11.9*  --   --   --   HCT 35.2*  --   --   --   PLT 316  --   --   --   LABPROT 30.1*  --   --  29.1*  INR 2.87*  --   --  2.75*  CREATININE 0.93  --   --  1.04  TROPONINI  --  <0.30 <0.30 <0.30    Estimated Creatinine Clearance: 74.4 ml/min (by C-G formula based on Cr of 1.04).   Medical History: Past Medical History  Diagnosis Date  . Coronary artery disease     a. h/o CABG 1997. b. stenting to graft-RCA, CB to LAD after LIMA in 2006. c. stenting to VG-RCA beyond initial stent and in-stent restenosis in 2011. c. Complex PCI 07/2013 to SVG-RCA and distal native RCA involving 4 sites (see cath report).  . Hypertension   . Hyperlipidemia   . Diabetes   . Chronic kidney disease   . Myocardial infarction   . GERD (gastroesophageal reflux disease)   . Arthritis   . Sleep apnea     hx of   . Barrett's esophagus   . Pre-syncope     a. 09/2012: in the setting of mild dehydration with significant straining possibly inducing a vagal event while he was dragging heavy logs.     Medications:  Prescriptions prior to admission  Medication Sig Dispense Refill  . carvedilol (COREG) 25 MG tablet Take 1.5 tablets (37.5 mg total) by mouth 2 (two) times daily with a meal.  90 tablet  5  . clopidogrel (PLAVIX) 75 MG tablet Take 75 mg by mouth daily with lunch.      . diltiazem (CARDIZEM CD) 120 MG 24 hr capsule Take 1 capsule (120 mg  total) by mouth daily.  30 capsule  5  . ezetimibe-simvastatin (VYTORIN) 10-20 MG per tablet Take 1 tablet by mouth daily.      . folic acid (FOLVITE) 1 MG tablet Take 1 mg by mouth daily with lunch.       . glimepiride (AMARYL) 1 MG tablet Take 1 mg by mouth daily.       10/2012 losartan (COZAAR) 50 MG tablet Take 50 mg by mouth 2 (two) times daily.       . metFORMIN (GLUCOPHAGE) 1000 MG tablet Take 1,000 mg by mouth 2 (two) times daily with a meal.      . nitroGLYCERIN (NITROSTAT) 0.4 MG SL tablet Place 0.4 mg under the tongue every 5 (five) minutes as needed for chest pain.      . predniSONE (DELTASONE) 5 MG tablet Take 5 mg by mouth daily with breakfast.      . warfarin (COUMADIN) 7.5 MG tablet Take 3.25-7.5 mg by mouth daily. Alternate between 3.25mg  and 7.5mg  daily        Assessment: 73 year old male admitted with Afib with RVR.  He was started on warfarin during his  last hospital admission, his INR continues to be therapeutic.  INR 2.75.  PTA dose: 7.5 mg po on Mon/Wed/Fri, 3.25 mg po on all other days  Goal of Therapy:  INR 2-3   Plan:  -Warfarin 3.25 mg po x1 -Daily PT/INR    Agapito Games, PharmD, BCPS Clinical Pharmacist Pager: [phone] 11/09/2013 7:50 AM

## 2013-11-09 NOTE — Progress Notes (Signed)
Patient HR 120's-140's, sustaining 120's with occasional PVC's, asymptomatic. MD paged twice, awaiting new orders. Will continue to monitor patient.

## 2013-11-09 NOTE — Progress Notes (Signed)
Paged returned by Dr. Antoine Poche, orders given to hold patient A.M dose of Cardizem until morning rounds. Will continue to monitor patient.

## 2013-11-09 NOTE — Progress Notes (Signed)
Subjective:  Had shortness of breath and dizziness last night noted with some slowing of his heart rate. Intravenous diltiazem was stopped. He then became rapid this morning and received a dose of diltiazem this morning but his heart rate is still running around 110. No chest pain. Troponins are all negative.  Objective:  Vital Signs in the last 24 hours: BP 147/83  Pulse 125  Temp(Src) 98.3 F (36.8 C) (Oral)  Resp 16  Ht 5\' 11"  (1.803 m)  Wt 94.938 kg (209 lb 4.8 oz)  BMI 29.20 kg/m2  SpO2 96%  Physical Exam: Pleasant elderly male in no acute distress this morning Lungs:  Clear Cardiac: Irregular rhythm, normal S1 and S2, no S3, 1/6 systolic murmur Abdomen:  Soft, nontender, no masses Extremities:  1+ edema present  Intake/Output from previous day: 09/26 0701 - 09/27 0700 In: 240 [P.O.:240] Out: 550 [Urine:550]  Weight Filed Weights   11/08/13 1143 11/08/13 1748 11/09/13 0500  Weight: 92.534 kg (204 lb) 96.1 kg (211 lb 13.8 oz) 94.938 kg (209 lb 4.8 oz)    Lab Results: Basic Metabolic Panel:  Recent Labs  11/11/13 1215 11/09/13 0514  NA 141 140  K 4.3 4.1  CL 104 103  CO2 22 21  GLUCOSE 162* 126*  BUN 26* 27*  CREATININE 0.93 1.04   CBC:  Recent Labs  11/08/13 1215  WBC 10.2  NEUTROABS 7.0  HGB 11.9*  HCT 35.2*  MCV 96.7  PLT 316   Cardiac Enzymes:  Recent Labs  11/08/13 1815 11/08/13 2345 11/09/13 0514  TROPONINI <0.30 <0.30 <0.30    Telemetry: Currently atrial fibrillation with somewhat rapid response, occasional pauses as low as 30-40 overnight  Assessment/Plan:  1. Persistent atrial fibrillation with some slow rate noted at times 2. Acute on chronic diastolic heart failure with dyspnea and elevated BNP 3. Coronary artery disease with previous stents 4. Hypertension better controlled 5. Long-term anticoagulation with warfarin therapeutic  Recommendations:  Additional furosemide to help with dyspnea this morning. He received his  usual dose of diltiazem this morning. An early cardioversion with TEE might be helpful to help with rate control in light of both tachycardia and bradycardia. Keep n.p.o. after midnight in case primary team wants to do so.       11/11/13  MD [facility]  11/09/2013, 8:50 AM

## 2013-11-09 NOTE — Progress Notes (Addendum)
Patient HR went as high the 160's with exertion, sustaining in 120's. Dr. Antoine Poche made aware, ordered to give A.M Cardizem at 0800, scheduled time . Will make on coming nurse aware. Will continue to monitor patient.

## 2013-11-10 ENCOUNTER — Ambulatory Visit: Payer: Medicare Other | Admitting: Pharmacist Clinician (PhC)/ Clinical Pharmacy Specialist

## 2013-11-10 DIAGNOSIS — Z7901 Long term (current) use of anticoagulants: Secondary | ICD-10-CM

## 2013-11-10 DIAGNOSIS — I4891 Unspecified atrial fibrillation: Secondary | ICD-10-CM

## 2013-11-10 DIAGNOSIS — Z8679 Personal history of other diseases of the circulatory system: Secondary | ICD-10-CM

## 2013-11-10 DIAGNOSIS — I509 Heart failure, unspecified: Secondary | ICD-10-CM

## 2013-11-10 DIAGNOSIS — I5033 Acute on chronic diastolic (congestive) heart failure: Secondary | ICD-10-CM

## 2013-11-10 LAB — BASIC METABOLIC PANEL
ANION GAP: 15 (ref 5–15)
BUN: 29 mg/dL — AB (ref 6–23)
CALCIUM: 9.2 mg/dL (ref 8.4–10.5)
CO2: 22 mEq/L (ref 19–32)
CREATININE: 1.08 mg/dL (ref 0.50–1.35)
Chloride: 99 mEq/L (ref 96–112)
GFR calc non Af Amer: 66 mL/min — ABNORMAL LOW (ref >90)
GFR, EST AFRICAN AMERICAN: 77 mL/min — AB (ref >90)
Glucose, Bld: 108 mg/dL — ABNORMAL HIGH (ref 70–99)
Potassium: 3.6 mEq/L — ABNORMAL LOW (ref 3.7–5.3)
Sodium: 136 mEq/L — ABNORMAL LOW (ref 137–147)

## 2013-11-10 LAB — GLUCOSE, CAPILLARY
Glucose-Capillary: 116 mg/dL — ABNORMAL HIGH (ref 70–99)
Glucose-Capillary: 228 mg/dL — ABNORMAL HIGH (ref 70–99)

## 2013-11-10 LAB — PROTIME-INR
INR: 2.63 — ABNORMAL HIGH (ref 0.00–1.49)
PROTHROMBIN TIME: 28.1 s — AB (ref 11.6–15.2)

## 2013-11-10 MED ORDER — WARFARIN SODIUM 2 MG PO TABS: 3.2500 mg | ORAL_TABLET | ORAL | Status: DC

## 2013-11-10 MED ORDER — FUROSEMIDE 20 MG PO TABS: 20.0000 mg | ORAL_TABLET | Freq: Every day | ORAL | Status: DC

## 2013-11-10 MED ORDER — DILTIAZEM HCL ER COATED BEADS 120 MG PO CP24: 120.0000 mg | ORAL_CAPSULE | Freq: Two times a day (BID) | ORAL | Status: AC

## 2013-11-10 MED ORDER — POTASSIUM CHLORIDE CRYS ER 20 MEQ PO TBCR: 40.0000 meq | EXTENDED_RELEASE_TABLET | Freq: Once | ORAL | Status: DC

## 2013-11-10 MED ORDER — POTASSIUM CHLORIDE CRYS ER 20 MEQ PO TBCR: 20.0000 meq | EXTENDED_RELEASE_TABLET | Freq: Every day | ORAL | Status: DC

## 2013-11-10 MED ORDER — WARFARIN SODIUM 7.5 MG PO TABS
7.5000 mg | ORAL_TABLET | ORAL | Status: DC
Start: 2013-11-10 — End: 2013-11-10
  Filled 2013-11-10: qty 1

## 2013-11-10 NOTE — Progress Notes (Signed)
Patient Name: [name] Date of Encounter: 11/10/2013  Principal Problem:   Acute on chronic diastolic congestive heart failure Active Problems:   Diabetes mellitus   Hypertensive heart disease   Long term (current) use of anticoagulants   Atrial fibrillation    Patient Profile: 73 yo male w/ CAD/CABG, HTN, DM, OSA, EF 55% by echo 10/2013, afib on Dilt/coum (dx 09/15-09/16) was admitted 09/26 w/ CP/SOB and rapid afib.  SUBJECTIVE: Feeling OK today, does not believe he missed any doses of meds (took filled days-of-week pill boxes to the beach). Did drink some wine at a tasting, but does not believe he over-indulged, estimates 1-1/2 glasses. Aggreeable to TEE/DCCV.   OBJECTIVE Filed Vitals:   11/09/13 1414 11/09/13 1731 11/09/13 2100 11/10/13 0500  BP: 111/56 118/76 140/80 128/69  Pulse: 48 98 65 102  Temp: 98.1 F (36.7 C)  97.8 F (36.6 C) 97.5 F (36.4 C)  TempSrc: Oral     Resp: 18  18 18   Height:      Weight:    204 lb 1.6 oz (92.579 kg)  SpO2: 96%  94% 93%    Intake/Output Summary (Last 24 hours) at 11/10/13 11/12/13 Last data filed at 11/09/13 2122  Gross per 24 hour  Intake    363 ml  Output      0 ml  Net    363 ml   Filed Weights   11/08/13 1748 11/09/13 0500 11/10/13 0500  Weight: 211 lb 13.8 oz (96.1 kg) 209 lb 4.8 oz (94.938 kg) 204 lb 1.6 oz (92.579 kg)    PHYSICAL EXAM General: Well developed, well nourished, male in no acute distress. Head: Normocephalic, atraumatic.  Neck: Supple without bruits, JVD minimal elevation. Lungs:  Resp regular and unlabored, few rales bases. Heart: Irregular, S1, S2, no S3, S4, or murmur; no rub. Abdomen: Soft, non-tender, non-distended, BS + x 4.  Extremities: No clubbing, cyanosis, no edema.  Neuro: Alert and oriented X 3. Moves all extremities spontaneously. Psych: Normal affect.  LABS: CBC:  Recent Labs  11/08/13 1215  WBC 10.2  NEUTROABS 7.0  HGB 11.9*  HCT 35.2*  MCV 96.7  PLT 316    INR:  Recent Labs  11/10/13 0455  INR 2.63*   Basic Metabolic Panel:  Recent Labs  11/12/13 0514 11/10/13 0455  NA 140 136*  K 4.1 3.6*  CL 103 99  CO2 21 22  GLUCOSE 126* 108*  BUN 27* 29*  CREATININE 1.04 1.08  CALCIUM 9.1 9.2   Liver Function Tests:  Recent Labs  11/08/13 1215  AST 19  ALT 25  ALKPHOS 82  BILITOT 0.4  PROT 6.6  ALBUMIN 3.6   Cardiac Enzymes:  Recent Labs  11/08/13 1815 11/08/13 2345 11/09/13 0514  TROPONINI <0.30 <0.30 <0.30    Recent Labs  11/08/13 1229  TROPIPOC 0.00   BNP: Pro B Natriuretic peptide (BNP)  Date/Time Value Ref Range Status  11/08/2013 12:15 PM 1341.0* 0 - 125 pg/mL Final  02/12/2009  7:37 PM <30.0  0.0 - 100.0 pg/mL Final   Hemoglobin A1C:  Recent Labs  11/08/13 1215  HGBA1C 6.6*   Urinalysis    Component Value Date/Time   COLORURINE ORANGE* 10/08/2012 1358   APPEARANCEUR HAZY* 10/08/2012 1358   LABSPEC 1.026 10/08/2012 1358   PHURINE 5.0 10/08/2012 1358   GLUCOSEU 100* 10/08/2012 1358   HGBUR NEGATIVE 10/08/2012 1358   BILIRUBINUR neg 01/28/2013 0943   BILIRUBINUR SMALL* 10/08/2012 1358  KETONESUR 15* 10/08/2012 1358   PROTEINUR 30 01/28/2013 0943   PROTEINUR 100* 10/08/2012 1358   UROBILINOGEN 0.2 01/28/2013 0943   UROBILINOGEN 1.0 10/08/2012 1358   NITRITE neg 01/28/2013 0943   NITRITE NEGATIVE 10/08/2012 1358   LEUKOCYTESUR Negative 01/28/2013 0943    TELE:  Afib, occ pauses slightly > 2 sec, none > 3 sec, HR spikes > 100 at times, occ < 50 but very briefly      Radiology/Studies: Dg Chest Portable 1 View 11/08/2013   CLINICAL DATA:  Sharp bilateral chest pain. Shortness of breath since 2 o'clock this morning. History of open-heart surgery, hypertension and diabetes.  EXAM: PORTABLE CHEST - 1 VIEW  COMPARISON:  10/28/2013  FINDINGS: The patient has had median sternotomy and CABG. The heart is enlarged. There are bilateral pleural effusions. There are Kerley B-lines at the lung bases consistent  with interstitial edema. There are no focal consolidations. Patient has had bilateral shoulder arthroplasty. There are degenerative changes in the shoulder joints bilaterally.  IMPRESSION: 1. Cardiomegaly and interstitial edema/pleural effusions. 2. No consolidations. 3. Postoperative and degenerative changes in the shoulders.   Electronically Signed   By: Rosalie Gums M.D.   On: 11/08/2013 13:06     Current Medications:  . carvedilol  37.5 mg Oral BID WC  . clopidogrel  75 mg Oral Q lunch  . diltiazem  120 mg Oral BID  . ezetimibe-simvastatin  1 tablet Oral q1800  . folic acid  1 mg Oral Q lunch  . glimepiride  1 mg Oral QAC breakfast  . insulin aspart  0-15 Units Subcutaneous TID WC  . losartan  50 mg Oral BID  . metFORMIN  1,000 mg Oral BID WC  . predniSONE  5 mg Oral Q breakfast  . sodium chloride  3 mL Intravenous Q12H  . Warfarin - Pharmacist Dosing Inpatient   Does not apply q1800      ASSESSMENT AND PLAN: Principal Problem:   Acute on chronic diastolic congestive heart failure - improved w/ Lasix 40 mg  IV x 1. No on diuretic chronically, may need.   Active Problems:   Diabetes mellitus - on home rx, and SSI    Hypertensive heart disease - SBP 110s-150s on current rx    Long term (current) use of anticoagulants - INR therapeutic    Atrial fibrillation - RVR on admission, home Dilt increased to BID, cannot further increase due to bradycardia, MD advise plan.  Signed, Theodore Demark , PA-C 8:12 AM 11/10/2013  Personally seen and examined. Agree with above.  AFIB - better rate controlled. Dilt 180 BID. Coreg 37.5 BID. 2 sec pause at night noted on tele.   He is comfortable after mild diuresis and improved rate control. I am comfortable with DC home. PT INR tomorrow. 1 week follow up.   Consider cardioversion (after 3 weeks of therapeutic INR 9/21 was first therapeutic reading so 2 more weeks) if rate control is not optimal for him.   Feels well currently.   Back  pain likely MSK which triggered ER visit.   Lasix 20mg  PO QD at home with of KCL. Diastolic HF may have "tipped" him over edge this time. Diet control.   , MD

## 2013-11-10 NOTE — Discharge Summary (Signed)
CARDIOLOGY DISCHARGE SUMMARY   Patient ID: [name] MRN: [id number] DOB/AGE: 05-26-1940 73 y.o.  Admit date: 11/08/2013 Discharge date: 11/10/2013  PCP: Georgann Housekeeper, MD Primary Cardiologist: Dr. Tresa Endo  Primary Discharge Diagnosis: Acute on chronic diastolic congestive heart failure - discharge weight 204 pounds  Secondary Discharge Diagnosis:    Diabetes mellitus   Hypertensive heart disease   Long term (current) use of anticoagulants   Atrial fibrillation  Procedures: None  Hospital Course: [name] is a 73 y.o. male with a long history of CAD, and a recent diagnosis of atrial fibrillation. He was hospitalized for rate control and initiation of anticoagulation. He was discharged on 09/16 on Cardizem, carvedilol and Coumadin. He was on vacation and developed shortness of breath and had some chest pain. The local hospital recommended admission, but he preferred to come home and be evaluated at [facility]. He had volume overload and was in atrial fibrillation with rapid ventricular response. He was admitted for further evaluation and treatment.  His diltiazem was increased from 120 mg daily to 120 mg twice a day for better heart rate control. On this dose, his heart rate is generally well-controlled, with no sustained bradycardia, no pauses seen greater than 3 seconds, and heart rates generally less than 100. He would still get up to 120 with activity, but this was not sustained.  He was diuresed with 1 dose of IV Lasix 40 mg. His weight decreased by 7 pounds and as his weight decreased his respiratory status improved. He was continued on his home medications plus sliding scale insulin. He developed hypokalemia because of this his Lasix, and this was supplemented. It was recommended that he remain on oral Lasix after discharge and will also give potassium supplementation with this. Cardiac enzymes were cycled and were negative for MI. Once he was diuresed and his heart rate  was better controlled, he had no more chest pain.  On 09/28, he was seen by Dr. Anne Fu and all data were reviewed. There was concern that the atrial fibrillation was causing the CHF and a TEE/cardioversion would be indicated. However, with his heart rate better controlled, and after diuresis, [name] is asymptomatic. He is likely to have problems with volume overload from hypertensive heart disease with or without atrial fibrillation. Therefore, we will continue rate control with anticoagulation and ongoing Lasix use. He is to be followed closely in the office, and consideration can be given to outpatient cardioversion once he has been therapeutic with anticoagulation for 3 weeks or more.  He was ambulating without shortness of breath and his heart rate control was improved. He is to weigh himself daily and is encouraged to stick to a low sodium, heart-healthy, diabetic diet. No further inpatient workup is indicated and he is considered stable for discharge, to follow up as an outpatient.   Labs:   Lab Results  Component Value Date   WBC 10.2 11/08/2013   HGB 11.9* 11/08/2013   HCT 35.2* 11/08/2013   MCV 96.7 11/08/2013   PLT 316 11/08/2013    Recent Labs Lab 11/08/13 1215  11/10/13 0455  NA 141  < > 136*  K 4.3  < > 3.6*  CL 104  < > 99  CO2 22  < > 22  BUN 26*  < > 29*  CREATININE 0.93  < > 1.08  CALCIUM 9.3  < > 9.2  PROT 6.6  --   --   BILITOT 0.4  --   --  ALKPHOS 82  --   --   ALT 25  --   --   AST 19  --   --   GLUCOSE 162*  < > 108*  < > = values in this interval not displayed.  Recent Labs  11/08/13 1815 11/08/13 2345 11/09/13 0514  TROPONINI <0.30 <0.30 <0.30   Pro B Natriuretic peptide (BNP)  Date/Time Value Ref Range Status  11/08/2013 12:15 PM 1341.0* 0 - 125 pg/mL Final  02/12/2009  7:37 PM <30.0  0.0 - 100.0 pg/mL Final    Recent Labs  11/10/13 0455  INR 2.63*      Radiology: Dg Chest Portable 1 View 11/08/2013   CLINICAL DATA:  Sharp bilateral  chest pain. Shortness of breath since 2 o'clock this morning. History of open-heart surgery, hypertension and diabetes.  EXAM: PORTABLE CHEST - 1 VIEW  COMPARISON:  10/28/2013  FINDINGS: The patient has had median sternotomy and CABG. The heart is enlarged. There are bilateral pleural effusions. There are Kerley B-lines at the lung bases consistent with interstitial edema. There are no focal consolidations. Patient has had bilateral shoulder arthroplasty. There are degenerative changes in the shoulder joints bilaterally.  IMPRESSION: 1. Cardiomegaly and interstitial edema/pleural effusions. 2. No consolidations. 3. Postoperative and degenerative changes in the shoulders.   Electronically Signed   By: Rosalie Gums M.D.   On: 11/08/2013 13:06   EKG: 11/08/2013 Atrial fibrillation, rapid ventricular response, no ST elevation Vent. rate 116 BPM PR interval * ms QRS duration 94 ms QT/QTc 332/461 ms P-R-T axes * 83 252  FOLLOW UP PLANS AND APPOINTMENTS Allergies  Allergen Reactions  . Ivp Dye [Iodinated Diagnostic Agents] Other (See Comments)    Increased blood pressure, heart rate     Medication List         carvedilol 25 MG tablet  Commonly known as:  COREG  Take 1.5 tablets (37.5 mg total) by mouth 2 (two) times daily with a meal.     clopidogrel 75 MG tablet  Commonly known as:  PLAVIX  Take 75 mg by mouth daily with lunch.     diltiazem 120 MG 24 hr capsule  Commonly known as:  CARDIZEM CD  Take 1 capsule (120 mg total) by mouth 2 (two) times daily before a meal.     ezetimibe-simvastatin 10-20 MG per tablet  Commonly known as:  VYTORIN  Take 1 tablet by mouth daily.     folic acid 1 MG tablet  Commonly known as:  FOLVITE  Take 1 mg by mouth daily with lunch.     furosemide 20 MG tablet  Commonly known as:  LASIX  Take 1 tablet (20 mg total) by mouth daily.     glimepiride 1 MG tablet  Commonly known as:  AMARYL  Take 1 mg by mouth daily.     losartan 50 MG tablet    Commonly known as:  COZAAR  Take 50 mg by mouth 2 (two) times daily.     metFORMIN 1000 MG tablet  Commonly known as:  GLUCOPHAGE  Take 1,000 mg by mouth 2 (two) times daily with a meal.     nitroGLYCERIN 0.4 MG SL tablet  Commonly known as:  NITROSTAT  Place 0.4 mg under the tongue every 5 (five) minutes as needed for chest pain.     potassium chloride SA 20 MEQ tablet  Commonly known as:  K-DUR,KLOR-CON  Take 1 tablet (20 mEq total) by mouth daily.     predniSONE  5 MG tablet  Commonly known as:  DELTASONE  Take 5 mg by mouth daily with breakfast.     warfarin 7.5 MG tablet  Commonly known as:  COUMADIN  Take 3.25-7.5 mg by mouth daily. Alternate between 3.25mg  and 7.5mg  daily        Discharge Instructions   (HEART FAILURE PATIENTS) Call MD:  Anytime you have any of the following symptoms: 1) 3 pound weight gain in 24 hours or 5 pounds in 1 week 2) shortness of breath, with or without a dry hacking cough 3) swelling in the hands, feet or stomach 4) if you have to sleep on extra pillows at night in order to breathe.    Complete by:  As directed      Diet - low sodium heart healthy    Complete by:  As directed      Diet Carb Modified    Complete by:  As directed      Increase activity slowly    Complete by:  As directed           Follow-up Information   Follow up with [facility] On 11/11/2013. (Coumadin check at 10:20 AM)    Contact information:   [address] [place] [address] [phone]      Follow up with Lennette Bihari, MD On 11/21/2013. (At 9:15 AM)    Specialty:  Cardiology   Contact information:   [address] [place] [address] [phone]       BRING ALL MEDICATIONS WITH YOU TO FOLLOW UP APPOINTMENTS  Time spent with patient to include physician time: 45 min Signed: Theodore Demark, PA-C 11/10/2013, 12:02 PM Co-Sign MD

## 2013-11-10 NOTE — Discharge Summary (Signed)
Personally seen and examined. Agree with above. Increased diltiazem as above. Improved rate control. Comfortable.  Consider cardioversion in future, see my progress note for details.  [name], MD

## 2013-11-10 NOTE — Progress Notes (Signed)
Utilization review completed.  

## 2013-11-10 NOTE — Progress Notes (Signed)
ANTICOAGULATION CONSULT NOTE - Follow Up Consult  Pharmacy Consult for Coumadin Indication: atrial fibrillation  Allergies  Allergen Reactions  . Ivp Dye [Iodinated Diagnostic Agents] Other (See Comments)    Increased blood pressure, heart rate    Patient Measurements: Height: 5\' 11"  (180.3 cm) Weight: 204 lb 1.6 oz (92.579 kg) IBW/kg (Calculated) : 75.3 Heparin Dosing Weight:    Vital Signs: Temp: 97.5 F (36.4 C) (09/28 0500) BP: 128/69 mmHg (09/28 0500) Pulse Rate: 102 (09/28 0500)  Labs:  Recent Labs  11/08/13 1215 11/08/13 1815 11/08/13 2345 11/09/13 0514 11/10/13 0455  HGB 11.9*  --   --   --   --   HCT 35.2*  --   --   --   --   PLT 316  --   --   --   --   LABPROT 30.1*  --   --  29.1* 28.1*  INR 2.87*  --   --  2.75* 2.63*  CREATININE 0.93  --   --  1.04 1.08  TROPONINI  --  <0.30 <0.30 <0.30  --     Estimated Creatinine Clearance: 70.8 ml/min (by C-G formula based on Cr of 1.08).   Medications:  Prescriptions prior to admission  Medication Sig Dispense Refill  . carvedilol (COREG) 25 MG tablet Take 1.5 tablets (37.5 mg total) by mouth 2 (two) times daily with a meal.  90 tablet  5  . clopidogrel (PLAVIX) 75 MG tablet Take 75 mg by mouth daily with lunch.      . diltiazem (CARDIZEM CD) 120 MG 24 hr capsule Take 1 capsule (120 mg total) by mouth daily.  30 capsule  5  . ezetimibe-simvastatin (VYTORIN) 10-20 MG per tablet Take 1 tablet by mouth daily.      . folic acid (FOLVITE) 1 MG tablet Take 1 mg by mouth daily with lunch.       . glimepiride (AMARYL) 1 MG tablet Take 1 mg by mouth daily.       11/12/13 losartan (COZAAR) 50 MG tablet Take 50 mg by mouth 2 (two) times daily.       . metFORMIN (GLUCOPHAGE) 1000 MG tablet Take 1,000 mg by mouth 2 (two) times daily with a meal.      . nitroGLYCERIN (NITROSTAT) 0.4 MG SL tablet Place 0.4 mg under the tongue every 5 (five) minutes as needed for chest pain.      . predniSONE (DELTASONE) 5 MG tablet Take 5 mg by  mouth daily with breakfast.      . warfarin (COUMADIN) 7.5 MG tablet Take 3.25-7.5 mg by mouth daily. Alternate between 3.25mg  and 7.5mg  daily        Assessment:  Afib with RVR  Anticoag: Afib on Coumadin- INR 2.63. PTA - Warf 7.5mg  MWF, 3.25mg  all other days  ID: WBC 10.2, afebrile, no abx  CV: Afib, CAD, HTN, CHF - coreg, plavix, dilt, vytorin, losartan. EF 55%. CABG 1997, multiple re-stenting since CABG, most recently June 2015. BP ok but HR 48-125. Plan TEE/DCCV  GI: carb-mod, LFTs wnl  Endo: CBG 94-138 - amaryl, metformin + ssi. On chronic pred - indication?  Renal: SCr 1.08. K down to 3.6.  Heme/Onc: Hgb 11.9, plt 316  Best Practices: warfarin  PTA Meds: addressed   Goal of Therapy:  INR 2-3 Monitor platelets by anticoagulation protocol: Yes   Plan:  Resume home Coumadin regiment 7.5mg  MWF, 3.25mg  TTSS   Harold Moncus S. July 2015, PharmD, BCPS Clinical Staff Pharmacist Pager [phone]  Merilynn Finland, [employer] 11/10/2013,8:28 AM

## 2013-11-11 ENCOUNTER — Ambulatory Visit: Payer: Medicare Other

## 2013-11-17 ENCOUNTER — Ambulatory Visit (INDEPENDENT_AMBULATORY_CARE_PROVIDER_SITE_OTHER): Payer: Medicare Other | Admitting: Pharmacist Clinician (PhC)/ Clinical Pharmacy Specialist

## 2013-11-17 DIAGNOSIS — Z7901 Long term (current) use of anticoagulants: Secondary | ICD-10-CM | POA: Insufficient documentation

## 2013-11-17 DIAGNOSIS — I4891 Unspecified atrial fibrillation: Secondary | ICD-10-CM

## 2013-11-17 LAB — POCT INR: INR: 3.9

## 2013-11-21 ENCOUNTER — Ambulatory Visit (INDEPENDENT_AMBULATORY_CARE_PROVIDER_SITE_OTHER): Payer: Medicare Other | Admitting: Cardiovascular Disease

## 2013-11-21 ENCOUNTER — Encounter: Payer: Self-pay | Admitting: Cardiovascular Disease

## 2013-11-21 ENCOUNTER — Ambulatory Visit (INDEPENDENT_AMBULATORY_CARE_PROVIDER_SITE_OTHER): Payer: Medicare Other | Admitting: Pharmacist Clinician (PhC)/ Clinical Pharmacy Specialist

## 2013-11-21 VITALS — BP 128/68 | HR 79 | Ht 71.0 in | Wt 206.1 lb

## 2013-11-21 DIAGNOSIS — Z7901 Long term (current) use of anticoagulants: Secondary | ICD-10-CM

## 2013-11-21 DIAGNOSIS — G4730 Sleep apnea, unspecified: Secondary | ICD-10-CM

## 2013-11-21 DIAGNOSIS — I25701 Atherosclerosis of coronary artery bypass graft(s), unspecified, with angina pectoris with documented spasm: Secondary | ICD-10-CM

## 2013-11-21 DIAGNOSIS — G4733 Obstructive sleep apnea (adult) (pediatric): Secondary | ICD-10-CM

## 2013-11-21 DIAGNOSIS — E782 Mixed hyperlipidemia: Secondary | ICD-10-CM

## 2013-11-21 DIAGNOSIS — I4891 Unspecified atrial fibrillation: Secondary | ICD-10-CM

## 2013-11-21 LAB — POCT INR: INR: 2.2

## 2013-11-21 MED ORDER — AMIODARONE HCL 200 MG PO TABS: ORAL_TABLET | ORAL | Status: DC

## 2013-11-21 NOTE — Patient Instructions (Signed)
Your physician has recommended you make the following change in your medication: start new prescription for amiodarone as directed on the bottle. 3 days after starting the amiodarone  decrease the carvedilol to 25 mg twice daily ( 1 tablet).  Your physician recommends that you return for lab work fasting.  Your physician recommends that you schedule a follow-up appointment in: 3-4 weeks.

## 2013-11-21 NOTE — Progress Notes (Signed)
Patient ID: ZANDER INGHAM, male   DOB: 08-26-1940, 73 y.o.   MRN: [id number]       HPI: WISSAM RESOR is a 73 y.o. male who presents to the office for cardiology evaluation following his recent hospitalizations for atrial fibrillation.  Mr. Juarbe  has established coronary artery disease dating back to 1997 as is s/p CABGx5 revascularization surgery. In 2006 he underwent stenting of the midportion of the graft to the right coronary artery and cutting balloon to the LAD after the LIMA insertion. On New Year's Eve 2011 he developed an acute coronary syndrome was found to have a new subtotal stenosis in the graft to right coronary artery more distal to the prior stent and also had in-stent narrowing to the proximal portion of the previously placed stent. He underwent successful intervention with a 3.5x18 mm stent. His LIMA to the LAD was patent. The graft to the circumflex was occluded. He had 90% stenosis in a small marginal vessel. He does have a history of hypertension, type 2 diabetes mellitus, as well as hyperlipidemia. His last nuclear perfusion study was in February 2014 which continued to show normal perfusion without scar or ischemia. An echo Doppler study at that time showed an ejection fraction of 55-60% with mild aortic sclerosis without stenosis. He had mild pulmonary hypertension with a PA pressure 32 mm.  In August 2014, he developed a presyncopal spell after he'd been dragging and unloading logs from a stroller. He apparently was evaluated in the emergency room. Head CT did not show acute abnormalities although did suggest small vessel disease. Chest x-ray was normal. Cardiac enzymes were negative. Hemoglobin was 13.6 hematocrit 37.3. Renal function revealed a BUN of 25 currently 1.13. I saw him in the office several days later and felt that his symptoms occurred in the setting of mild dehydration with significant straining possibly inducing a vagal event while he was dragging heavy  logs.   He does have a history of Barrett's esophagus. Recently, he has developed episodes of chest pain typically after he eats. This has been associated with a sensation of increased gas. He feels symptoms radiating from his stomach into his esophagus. Recently, he started taking over-the-counter probiotics and some of his abdominal bloating and gas has significantly improved. He denies recent chest pressure that is similar to his prior angina. He denies any change in shortness of breath development. He recently saw the PA for Dr. [facility]lyce D[facility]. Because of this chest pain history it was recommended that he see me prior to planned endoscopy when I saw him in November 2014.  He did undergo his procedure. He tolerated it well. He also tells me he is being cared for by the by Dr. Dierdre Forth for his arthritis. He denies any exertional chest pain. He denies shortness of breath. He is unaware of palpitations. He does have diabetes. He has been taking his Bentyl prednisone for his Barrett's esophagus and GERD.  In the spring of 2015, Mr. Wiacek had noticed recurrent episodes of chest pain.  He ultimately underwent cardiac catheterization on 07/30/2013 which showed significant native CAD with proximal 80% LAD stenosis and total mid LAD occlusion, 90% circumflex stenosis.  A total proximal occlusion of the native RCA.  He had a patent LIMA graft to the LAD.  The graft, which had supplied the circumflex marginal vessel and had previously been demonstrated to be occluded remained occluded.  He now had progressive disease in the vein graft supplying the right coronary  artery and 4 lesion intervention was performed to the  saphenous vein graft proximally, mid, distally with DES stenting and he also underwent PTCA to the PLA branch of the distal native RCA.  Subsequently he noticed improvement in his previous chest pain.  Apparently, he was admitted to: Hospital on 10/28/2013 with atrial fibrillation with a rapid  ventricular response.  Cardiac enzymes were negative.  An echo Doppler study showed an EF of 55% with basal inferior severe hypokinesis.  There were no significant valvular abnormalities.  He was started on warfarin therapy.  He was readmitted on September 26 2 December 28 with acute on chronic diastolic CHF.  It was felt that his atrial fibrillation was causing the CHF.  He diureses well.  He now presents to the office for further evaluation.  He denies chest pain.  He experiences exertional shortness of breath.  Past Medical History  Diagnosis Date  . Coronary artery disease     a. h/o CABG 1997. b. stenting to graft-RCA, CB to LAD after LIMA in 2006. c. stenting to VG-RCA beyond initial stent and in-stent restenosis in 2011. c. Complex PCI 07/2013 to SVG-RCA and distal native RCA involving 4 sites (see cath report).  . Hypertension   . Hyperlipidemia   . Diabetes   . Chronic kidney disease   . Myocardial infarction   . GERD (gastroesophageal reflux disease)   . Arthritis   . Sleep apnea     hx of   . Barrett's esophagus   . Pre-syncope     a. 09/2012: in the setting of mild dehydration with significant straining possibly inducing a vagal event while he was dragging heavy logs.     Past Surgical History  Procedure Laterality Date  . Coronary angioplasty with stent placement    . Coronary artery bypass graft    . Joint replacement    . Coronary stent placement  07/30/2013    DES to RCA     DR Javyn Havlin  . Hernia repair    . Shoulder surgery    . Kidney stone surgery      Allergies  Allergen Reactions  . Ivp Dye [Iodinated Diagnostic Agents] Other (See Comments)    Increased blood pressure, heart rate    Current Outpatient Prescriptions  Medication Sig Dispense Refill  . carvedilol (COREG) 25 MG tablet Take 1.5 tablets (37.5 mg total) by mouth 2 (two) times daily with a meal.  90 tablet  5  . clopidogrel (PLAVIX) 75 MG tablet Take 75 mg by mouth daily with lunch.      . diltiazem  (CARDIZEM CD) 120 MG 24 hr capsule Take 1 capsule (120 mg total) by mouth 2 (two) times daily before a meal.  60 capsule  5  . ezetimibe-simvastatin (VYTORIN) 10-20 MG per tablet Take 1 tablet by mouth daily.      . folic acid (FOLVITE) 1 MG tablet Take 1 mg by mouth daily with lunch.       . furosemide (LASIX) 20 MG tablet Take 1 tablet (20 mg total) by mouth daily.  30 tablet  11  . glimepiride (AMARYL) 1 MG tablet Take 1 mg by mouth daily.       Marland Kitchen losartan (COZAAR) 50 MG tablet Take 50 mg by mouth 2 (two) times daily.       . metFORMIN (GLUCOPHAGE) 1000 MG tablet Take 1,000 mg by mouth 2 (two) times daily with a meal.      . nitroGLYCERIN (NITROSTAT) 0.4 MG  SL tablet Place 0.4 mg under the tongue every 5 (five) minutes as needed for chest pain.      . potassium chloride SA (K-DUR,KLOR-CON) 20 MEQ tablet Take 1 tablet (20 mEq total) by mouth daily.  30 tablet  11  . predniSONE (DELTASONE) 5 MG tablet Take 5 mg by mouth daily with breakfast.      . warfarin (COUMADIN) 7.5 MG tablet Take 3.25-7.5 mg by mouth daily. Alternate between 3.25mg  and 7.5mg  daily       No current facility-administered medications for this visit.    History   Social History  . Marital Status: Married    Spouse Name: N/A    Number of Children: 1  . Years of Education: N/A   Occupational History  . Retired    Social History Main Topics  . Smoking status: Never Smoker   . Smokeless tobacco: Never Used  . Alcohol Use: Yes     Comment: 2-3 beers a month.  . Drug Use: No  . Sexual Activity: Not on file   Other Topics Concern  . Not on file   Social History Narrative   Lives with family   Social history notable that he is married he has one child. There is no tobacco use. He does not routinely exercise. He does drink alcohol infrequently.  Family History  Problem Relation Age of Onset  . Heart attack Brother   . Heart disease Brother   . Diabetes Mother   . Heart disease Mother   . Hypertension Mother    . Cancer Father   . Diabetes Sister   . Hypertension Sister   . Heart disease Brother   . Diabetes Brother   . Colon cancer Neg Hx     ROS General: Negative; No fevers, chills, or night sweats;  HEENT: Negative; No changes in vision or hearing, sinus congestion, difficulty swallowing Pulmonary: Negative; No cough, wheezing,, hemoptysis Cardiovascular: See history of present illness GI: Positive for history of abdominal bloating No nausea, vomiting, diarrhea, or abdominal pain GU: Negative; No dysuria, hematuria, or difficulty voiding Musculoskeletal: Negative; no myalgias, joint pain, or weakness Hematologic/Oncology: Negative; no easy bruising, bleeding Endocrine: Positive for diabetes; no heat/cold intolerance; Neuro: Negative; no changes in balance, headaches Skin: Negative; No rashes or skin lesions Psychiatric: Negative; No behavioral problems, depression Sleep: Positive for sleep apnea No snoring, daytime sleepiness, hypersomnolence, bruxism, restless legs, hypnogognic hallucinations, no cataplexy Other comprehensive 14 point system review is negative.  PE BP 128/68  Pulse 79  Ht 5\' 11"  (1.803 m)  Wt 206 lb 1.6 oz (93.486 kg)  BMI 28.76 kg/m2  Repeat blood pressure by me was 160/80 General: Alert, oriented, no distress.  Skin: normal turgor, no rashes HEENT: Normocephalic, atraumatic. Pupils round and reactive; sclera anicteric;no lid lag. No xanthelasmas. Nose without nasal septal hypertrophy Mouth/Parynx benign; Mallinpatti scale 3 Neck: No JVD, no carotid bruits with normal carotid upstroke Lungs: clear to ausculatation and percussion; no wheezing or rales Heart: RRR, s1 s2 normal 1-2/6 systolic murmur. No diastolic murmur. No rub thrills or heaves. No S3  Abdomen: Moderate central adiposity soft, nontender; no hepatosplenomehaly, BS+; abdominal aorta nontender and not dilated by palpation. Back: No CVA tenderness Pulses 2+ Extremities: no clubbing cyanosis or  edema, Homan's sign negative  Neurologic: grossly nonfocal Psychologic: Normal affect and mood  ECG (independently read by me): Atrial fibrillation at 79 beats per minute.  LVH by voltage criteria.  ST-T abnormality laterally  Prior June 2015 ECG (independently  read by me): Normal sinus rhythm at 64 beats per minute.  Inferior lateral T-wave abnormality  Prior February 2015 ECG (independently read by me): Sinus rhythm with inferolateral ST-T changes, unchanged  Prior ECG of 01/03/2013: Sinus rhythm at 82 beats per minute with previously noted the inferolateral T-wave abnormalities; however these T changes appear slightly more prominent than his last ECG in are diffuse. Probable LVH with repolarization changes; incomplete right bundle branch block  LABS:  BMET    Component Value Date/Time   NA 136* 11/10/2013 0455   K 3.6* 11/10/2013 0455   CL 99 11/10/2013 0455   CO2 22 11/10/2013 0455   GLUCOSE 108* 11/10/2013 0455   BUN 29* 11/10/2013 0455   CREATININE 1.08 11/10/2013 0455   CREATININE 1.06 07/28/2013 1236   CALCIUM 9.2 11/10/2013 0455   GFRNONAA 66* 11/10/2013 0455   GFRAA 77* 11/10/2013 0455     Hepatic Function Panel     Component Value Date/Time   PROT 6.6 11/08/2013 1215   ALBUMIN 3.6 11/08/2013 1215   AST 19 11/08/2013 1215   ALT 25 11/08/2013 1215   ALKPHOS 82 11/08/2013 1215   BILITOT 0.4 11/08/2013 1215   BILIDIR <0.2 10/28/2013 1830   IBILI NOT CALCULATED 10/28/2013 1830     CBC    Component Value Date/Time   WBC 10.2 11/08/2013 1215   WBC 6.6 01/28/2013 0943   RBC 3.64* 11/08/2013 1215   RBC 5.05 01/28/2013 0943   HGB 11.9* 11/08/2013 1215   HGB 15.4 01/28/2013 0943   HCT 35.2* 11/08/2013 1215   HCT 49.6 01/28/2013 0943   PLT 316 11/08/2013 1215   MCV 96.7 11/08/2013 1215   MCV 98.3* 01/28/2013 0943   MCH 32.7 11/08/2013 1215   MCH 30.5 01/28/2013 0943   MCHC 33.8 11/08/2013 1215   MCHC 31.0* 01/28/2013 0943   RDW 14.3 11/08/2013 1215   LYMPHSABS 1.6 11/08/2013 1215    MONOABS 1.5* 11/08/2013 1215   EOSABS 0.1 11/08/2013 1215   BASOSABS 0.1 11/08/2013 1215     BNP    Component Value Date/Time   PROBNP 1341.0* 11/08/2013 1215    Lipid Panel     Component Value Date/Time   CHOL 200 10/29/2013 0446   TRIG 134 10/29/2013 0446   HDL 40 10/29/2013 0446   CHOLHDL 5.0 10/29/2013 0446   VLDL 27 10/29/2013 0446   LDLCALC 133* 10/29/2013 0446     RADIOLOGY: Dg Chest 2 View  10/08/2012   CLINICAL DATA:  Chest pain  EXAM: CHEST  2 VIEW  COMPARISON:  02/12/2009.  FINDINGS: Mild hyperinflation of the lungs. Prior CABG. Heart and mediastinal contours are within normal limits. No focal opacities or effusions. No acute bony abnormality.  IMPRESSION: No active cardiopulmonary disease.   Electronically Signed   By: Charlett Nose   On: 10/08/2012 14:55   Ct Head Wo Contrast  10/08/2012   *RADIOLOGY REPORT*  Clinical Data: Near-syncope  CT HEAD WITHOUT CONTRAST  Technique:  Contiguous axial images were obtained from the base of the skull through the vertex without contrast.  Comparison: 05/06/2007  Findings: No evidence of parenchymal hemorrhage or extra-axial fluid collection. No mass lesion, mass effect, or midline shift.  No CT evidence of acute infarction.  Subcortical white matter and periventricular small vessel ischemic changes.  Intracranial atherosclerosis.  Mild global cortical atrophy, likely age appropriate.  No ventriculomegaly.  The visualized paranasal sinuses are essentially clear. The mastoid air cells are unopacified.  No evidence of calvarial fracture.  IMPRESSION: No evidence of acute intracranial abnormality.  Mild atrophy with small vessel ischemic changes and intracranial atherosclerosis.   Original Report Authenticated By: Charline Bills, M.D.      ASSESSMENT AND PLAN:  Mr. Bayerl is a 73 year old white male who is status post CABG surgery in 1997 and has undergone several interventions in 2006, 2011 and most recently underwent for lesion intervention  to the vein graft supplying his RCA.  His chest pain.  Symptoms have significantly improved following intervention with additional stenting to the SVG as well as to his distal native PLA.  He has developed atrial fibrillation, documented since mid September 2015.  He was started on warfarin anticoagulation.  Presently, he is still in atrial fibrillation.  I am electing to add amiodarone and he will start this at 200 mg daily for one week and then increase this to 200 mg twice a day.  His Coumadin dose will need to be adjusted.  His INR today is 2.2.  I discussed this with Baxter Hire will be rechecking his INR in approximately 4-5 days.  He will also decrease his carvedilol after 3 days of amiodarone therapy to 25 mg twice a day from his present dose of 37.5 mg twice a day.  I will see him back in the office in 3-4 weeks.  At that time if he is still in atrial fibrillation plans will be made for him to undergo cardioversion.  Time spent: 30 minutes  Lennette Bihari, MD, [facility]  11/21/2013 10:28 AM

## 2013-11-23 ENCOUNTER — Encounter: Payer: Self-pay | Admitting: Cardiovascular Disease

## 2013-11-28 ENCOUNTER — Other Ambulatory Visit: Payer: Self-pay | Admitting: Cardiovascular Disease

## 2013-11-28 NOTE — Telephone Encounter (Signed)
Rx was sent to pharmacy electronically. 

## 2013-12-01 ENCOUNTER — Ambulatory Visit: Payer: Medicare Other | Admitting: Pharmacist Clinician (PhC)/ Clinical Pharmacy Specialist

## 2013-12-05 ENCOUNTER — Other Ambulatory Visit: Payer: Self-pay | Admitting: Pharmacist Clinician (PhC)/ Clinical Pharmacy Specialist

## 2013-12-05 ENCOUNTER — Ambulatory Visit (INDEPENDENT_AMBULATORY_CARE_PROVIDER_SITE_OTHER): Payer: Medicare Other | Admitting: Pharmacist Clinician (PhC)/ Clinical Pharmacy Specialist

## 2013-12-05 DIAGNOSIS — Z7901 Long term (current) use of anticoagulants: Secondary | ICD-10-CM

## 2013-12-05 DIAGNOSIS — I4891 Unspecified atrial fibrillation: Secondary | ICD-10-CM

## 2013-12-05 LAB — POCT INR: INR: 4.9

## 2013-12-05 MED ORDER — WARFARIN SODIUM 5 MG PO TABS: 5.0000 mg | ORAL_TABLET | Freq: Every day | ORAL | Status: DC

## 2013-12-05 MED ORDER — WARFARIN SODIUM 5 MG PO TABS: 5.0000 mg | ORAL_TABLET | Freq: Every day | ORAL | Status: AC

## 2013-12-09 LAB — LIPID PANEL
Cholesterol: 225 mg/dL — ABNORMAL HIGH (ref 0–200)
HDL: 52 mg/dL (ref >39)
LDL Cholesterol: 141 mg/dL — ABNORMAL HIGH (ref 0–99)
Total CHOL/HDL Ratio: 4.3 Ratio
Triglycerides: 158 mg/dL — ABNORMAL HIGH (ref <150)
VLDL: 32 mg/dL (ref 0–40)

## 2013-12-09 LAB — CBC
HCT: 39.1 % (ref 39.0–52.0)
Hemoglobin: 13.3 g/dL (ref 13.0–17.0)
MCH: 33.3 pg (ref 26.0–34.0)
MCHC: 34 g/dL (ref 30.0–36.0)
MCV: 98 fL (ref 78.0–100.0)
PLATELETS: 304 10*3/uL (ref 150–400)
RBC: 3.99 MIL/uL — ABNORMAL LOW (ref 4.22–5.81)
RDW: 14.2 % (ref 11.5–15.5)
WBC: 8.2 10*3/uL (ref 4.0–10.5)

## 2013-12-09 LAB — COMPREHENSIVE METABOLIC PANEL
ALBUMIN: 4.6 g/dL (ref 3.5–5.2)
ALT: 15 U/L (ref 0–53)
AST: 15 U/L (ref 0–37)
Alkaline Phosphatase: 66 U/L (ref 39–117)
BUN: 29 mg/dL — ABNORMAL HIGH (ref 6–23)
CALCIUM: 9.5 mg/dL (ref 8.4–10.5)
CHLORIDE: 102 meq/L (ref 96–112)
CO2: 26 meq/L (ref 19–32)
Creat: 1.46 mg/dL — ABNORMAL HIGH (ref 0.50–1.35)
Glucose, Bld: 112 mg/dL — ABNORMAL HIGH (ref 70–99)
Potassium: 4.6 mEq/L (ref 3.5–5.3)
SODIUM: 141 meq/L (ref 135–145)
Total Bilirubin: 0.6 mg/dL (ref 0.2–1.2)
Total Protein: 7.1 g/dL (ref 6.0–8.3)

## 2013-12-09 LAB — MAGNESIUM: Magnesium: 1.3 mg/dL — ABNORMAL LOW (ref 1.5–2.5)

## 2013-12-09 LAB — TSH: TSH: 2.108 u[IU]/mL (ref 0.350–4.500)

## 2013-12-15 ENCOUNTER — Encounter: Payer: Self-pay | Admitting: Cardiovascular Disease

## 2013-12-15 ENCOUNTER — Ambulatory Visit (INDEPENDENT_AMBULATORY_CARE_PROVIDER_SITE_OTHER): Payer: Medicare Other | Admitting: Cardiovascular Disease

## 2013-12-15 ENCOUNTER — Ambulatory Visit (INDEPENDENT_AMBULATORY_CARE_PROVIDER_SITE_OTHER): Payer: Medicare Other | Admitting: Pharmacist Clinician (PhC)/ Clinical Pharmacy Specialist

## 2013-12-15 VITALS — BP 120/70 | HR 87 | Ht 71.0 in | Wt 208.4 lb

## 2013-12-15 DIAGNOSIS — I2583 Coronary atherosclerosis due to lipid rich plaque: Secondary | ICD-10-CM

## 2013-12-15 DIAGNOSIS — G4730 Sleep apnea, unspecified: Secondary | ICD-10-CM

## 2013-12-15 DIAGNOSIS — K219 Gastro-esophageal reflux disease without esophagitis: Secondary | ICD-10-CM

## 2013-12-15 DIAGNOSIS — Z7901 Long term (current) use of anticoagulants: Secondary | ICD-10-CM

## 2013-12-15 DIAGNOSIS — G4733 Obstructive sleep apnea (adult) (pediatric): Secondary | ICD-10-CM

## 2013-12-15 DIAGNOSIS — E785 Hyperlipidemia, unspecified: Secondary | ICD-10-CM

## 2013-12-15 DIAGNOSIS — I480 Paroxysmal atrial fibrillation: Secondary | ICD-10-CM

## 2013-12-15 DIAGNOSIS — I4891 Unspecified atrial fibrillation: Secondary | ICD-10-CM

## 2013-12-15 DIAGNOSIS — I2510 Atherosclerotic heart disease of native coronary artery without angina pectoris: Secondary | ICD-10-CM

## 2013-12-15 LAB — POCT INR: INR: 3.8

## 2013-12-15 NOTE — Progress Notes (Signed)
Patient ID: [name], male   DOB: 1940-12-26, 73 y.o.   MRN: [id number]       HPI: [name] is a 73 y.o. male who presents to the office for follow-up cardiology evaluation.  [name]  has established coronary artery disease dating back to 1997 as is s/p CABGx5 revascularization surgery. In 2006 he underwent stenting of the midportion of the graft to the right coronary artery and cutting balloon to the LAD after the LIMA insertion. On New Year's Eve 2011 he developed an acute coronary syndrome was found to have a new subtotal stenosis in the graft to right coronary artery more distal to the prior stent and also had in-stent narrowing to the proximal portion of the previously placed stent. He underwent successful intervention with a 3.5x18 mm stent. His LIMA to the LAD was patent. The graft to the circumflex was occluded. He had 90% stenosis in a small marginal vessel. He does have a history of hypertension, type 2 diabetes mellitus, as well as hyperlipidemia. His last nuclear perfusion study was in February 2014 which continued to show normal perfusion without scar or ischemia. An echo Doppler study at that time showed an ejection fraction of 55-60% with mild aortic sclerosis without stenosis. He had mild pulmonary hypertension with a PA pressure 32 mm.  In August 2014, he developed a presyncopal spell after he'd been dragging and unloading logs from a stroller. He apparently was evaluated in the emergency room. Head CT did not show acute abnormalities although did suggest small vessel disease. Chest x-ray was normal. Cardiac enzymes were negative. Hemoglobin was 13.6 hematocrit 37.3. Renal function revealed a BUN of 25 currently 1.13. I saw him in the office several days later and felt that his symptoms occurred in the setting of mild dehydration with significant straining possibly inducing a vagal event while he was dragging heavy logs.   He does have a history of Barrett's esophagus.  Recently, he has developed episodes of chest pain typically after he eats. This has been associated with a sensation of increased gas. He feels symptoms radiating from his stomach into his esophagus. Recently, he started taking over-the-counter probiotics and some of his abdominal bloating and gas has significantly improved. He denies recent chest pressure that is similar to his prior angina. He denies any change in shortness of breath development. He recently saw the PA for Dr. Arlyce Dice [facility]. Because of this chest pain history it was recommended that he see me prior to planned endoscopy when I saw him in November 2014.  He did undergo his procedure. He tolerated it well. He also tells me he is being cared for by the by Dr. Dierdre Forth for his arthritis. He denies any exertional chest pain. He denies shortness of breath. He is unaware of palpitations. He does have diabetes. He has been taking his Bentyl prednisone for his Barrett's esophagus and GERD.  In the spring of 2015, [name] had noticed recurrent episodes of chest pain.  He ultimately underwent cardiac catheterization on 07/30/2013 which showed significant native CAD with proximal 80% LAD stenosis and total mid LAD occlusion, 90% circumflex stenosis.  A total proximal occlusion of the native RCA.  He had a patent LIMA graft to the LAD.  The graft, which had supplied the circumflex marginal vessel and had previously been demonstrated to be occluded remained occluded.  He now had progressive disease in the vein graft supplying the right coronary artery and 4 lesion intervention was  performed to the  saphenous vein graft proximally, mid, distally with DES stenting and he also underwent PTCA to the PLA branch of the distal native RCA.  Subsequently he noticed improvement in his previous chest pain.  He was admitted to [facility] on 10/28/2013 with atrial fibrillation with a rapid ventricular response.  Cardiac enzymes were negative.  An echo Doppler  study showed an EF of 55% with basal inferior severe hypokinesis.  There were no significant valvular abnormalities.  He was started on warfarin therapy.  He was readmitted on September 26 2 December 28 with acute on chronic diastolic CHF.  It was felt that his atrial fibrillation was causing the CHF.   When I saw him for evaluation following his hospitalization one month ago.  I instituted therapy with amiodarone.  Initially it 200 mg daily and after 1 week.  He was to increase this to 200 mg twice a day.  We also made arrangements to reduce and adjust his Coumadin dosing.  Apparently, he never increased the amiodarone to twice a day and is only been taking 200 mg per day.  We had reduced his carvedilol from 37.5 mg twice a day to 25 mg twice a day.  He feels that he is breathing better.  His shortness of breath is less.  He does have obstructive sleep apnea but has not been utilizing CPAP therapy for over 10-15 years. He states she typically goes to bed between 9 and 9:30 PM and wakes up at 8 AM.  He does significantly snore and wakes up at least 3 times per night.  An INR today was checked and this was elevated at 3.8.  He presents for evaluation.  Past Medical History  Diagnosis Date  . Coronary artery disease     a. h/o CABG 1997. b. stenting to graft-RCA, CB to LAD after LIMA in 2006. c. stenting to VG-RCA beyond initial stent and in-stent restenosis in 2011. c. Complex PCI 07/2013 to SVG-RCA and distal native RCA involving 4 sites (see cath report).  . Hypertension   . Hyperlipidemia   . Diabetes   . Chronic kidney disease   . Myocardial infarction   . GERD (gastroesophageal reflux disease)   . Arthritis   . Sleep apnea     hx of   . Barrett's esophagus   . Pre-syncope     a. 09/2012: in the setting of mild dehydration with significant straining possibly inducing a vagal event while he was dragging heavy logs.     Past Surgical History  Procedure Laterality Date  . Coronary angioplasty  with stent placement    . Coronary artery bypass graft    . Joint replacement    . Coronary stent placement  07/30/2013    DES to RCA     DR Twanisha Foulk  . Hernia repair    . Shoulder surgery    . Kidney stone surgery      Allergies  Allergen Reactions  . Ivp Dye [Iodinated Diagnostic Agents] Other (See Comments)    Increased blood pressure, heart rate    Current Outpatient Prescriptions  Medication Sig Dispense Refill  . amiodarone (PACERONE) 200 MG tablet Take 1 tablet daily for 1 week then 1 tablet twice daily thereafter. 60 tablet 6  . carvedilol (COREG) 25 MG tablet Take 25 mg by mouth 2 (two) times daily with a meal.    . clopidogrel (PLAVIX) 75 MG tablet TAKE 1 TABLET EVERY DAY 30 tablet 11  . diltiazem (  CARDIZEM CD) 120 MG 24 hr capsule Take 1 capsule (120 mg total) by mouth 2 (two) times daily before a meal. 60 capsule 5  . ezetimibe-simvastatin (VYTORIN) 10-20 MG per tablet Take 1 tablet by mouth daily.    . folic acid (FOLVITE) 1 MG tablet Take 1 mg by mouth daily with lunch.     . furosemide (LASIX) 20 MG tablet Take 1 tablet (20 mg total) by mouth daily. 30 tablet 11  . glimepiride (AMARYL) 1 MG tablet Take 1 mg by mouth daily.     Marland Kitchen losartan (COZAAR) 50 MG tablet Take 50 mg by mouth 2 (two) times daily.     . metFORMIN (GLUCOPHAGE) 1000 MG tablet Take 1,000 mg by mouth 2 (two) times daily with a meal.    . nitroGLYCERIN (NITROSTAT) 0.4 MG SL tablet Place 0.4 mg under the tongue every 5 (five) minutes as needed for chest pain.    . potassium chloride SA (K-DUR,KLOR-CON) 20 MEQ tablet Take 1 tablet (20 mEq total) by mouth daily. 30 tablet 11  . predniSONE (DELTASONE) 5 MG tablet Take 4 mg by mouth daily with breakfast.     . warfarin (COUMADIN) 5 MG tablet Take 1 tablet (5 mg total) by mouth daily. 90 tablet 3   No current facility-administered medications for this visit.    History   Social History  . Marital Status: Married    Spouse Name: N/A    Number of Children: 1    . Years of Education: N/A   Occupational History  . Retired    Social History Main Topics  . Smoking status: Never Smoker   . Smokeless tobacco: Never Used  . Alcohol Use: Yes     Comment: 2-3 beers a month.  . Drug Use: No  . Sexual Activity: Not on file   Other Topics Concern  . Not on file   Social History Narrative   Lives with family   Social history notable that he is married he has one child. There is no tobacco use. He does not routinely exercise. He does drink alcohol infrequently.  Family History  Problem Relation Age of Onset  . Heart attack Brother   . Heart disease Brother   . Diabetes Mother   . Heart disease Mother   . Hypertension Mother   . Cancer Father   . Diabetes Sister   . Hypertension Sister   . Heart disease Brother   . Diabetes Brother   . Colon cancer Neg Hx     ROS General: Negative; No fevers, chills, or night sweats;  HEENT: Negative; No changes in vision or hearing, sinus congestion, difficulty swallowing Pulmonary: Negative; No cough, wheezing,, hemoptysis Cardiovascular: See history of present illness GI: Positive for history of abdominal bloating No nausea, vomiting, diarrhea, or abdominal pain GU: Negative; No dysuria, hematuria, or difficulty voiding Musculoskeletal: Negative; no myalgias, joint pain, or weakness Hematologic/Oncology: Negative; no easy bruising, bleeding Endocrine: Positive for diabetes; no heat/cold intolerance; Neuro: Negative; no changes in balance, headaches Skin: Negative; No rashes or skin lesions Psychiatric: Negative; No behavioral problems, depression Sleep: Positive for sleep apnea and snoring, daytime sleepiness, hypersomnolence, bruxism, restless legs, hypnogognic hallucinations, no cataplexy Other comprehensive 14 point system review is negative.  PE BP 120/70 mmHg  Pulse 87  Ht 5\' 11"  (1.803 m)  Wt 208 lb 6.4 oz (94.53 kg)  BMI 29.08 kg/m2  General: Alert, oriented, no distress.  Skin:  normal turgor, no rashes HEENT: Normocephalic, atraumatic. Pupils  round and reactive; sclera anicteric;no lid lag. No xanthelasmas. Nose without nasal septal hypertrophy Mouth/Parynx benign; Mallinpatti scale 3 Neck: No JVD, no carotid bruits with normal carotid upstroke Lungs: clear to ausculatation and percussion; no wheezing or rales Heart: RRR, s1 s2 normal 1-2/6 systolic murmur. No diastolic murmur. No rub thrills or heaves. No S3  Abdomen: Moderate central adiposity soft, nontender; no hepatosplenomehaly, BS+; abdominal aorta nontender and not dilated by palpation. Back: No CVA tenderness Pulses 2+ Extremities: no clubbing cyanosis or edema, Homan's sign negative  Neurologic: grossly nonfocal Psychologic: Normal affect and mood  ECG (independently read by me): atrial fibrillation at 87 bpm.  RV conduction delay.  Inferolateral, lateral T-wave changes.  QTc interval 476 ms  October 2015 ECG (independently read by me): Atrial fibrillation at 79 beats per minute.  LVH by voltage criteria.  ST-T abnormality laterally  Prior June 2015 ECG (independently read by me): Normal sinus rhythm at 64 beats per minute.  Inferior lateral T-wave abnormality  Prior February 2015 ECG (independently read by me): Sinus rhythm with inferolateral ST-T changes, unchanged  Prior ECG of 01/03/2013: Sinus rhythm at 82 beats per minute with previously noted the inferolateral T-wave abnormalities; however these T changes appear slightly more prominent than his last ECG in are diffuse. Probable LVH with repolarization changes; incomplete right bundle branch block  LABS:  BMET    Component Value Date/Time   NA 141 12/09/2013 0927   K 4.6 12/09/2013 0927   CL 102 12/09/2013 0927   CO2 26 12/09/2013 0927   GLUCOSE 112* 12/09/2013 0927   BUN 29* 12/09/2013 0927   CREATININE 1.46* 12/09/2013 0927   CREATININE 1.08 11/10/2013 0455   CALCIUM 9.5 12/09/2013 0927   GFRNONAA 66* 11/10/2013 0455   GFRAA 77*  11/10/2013 0455     Hepatic Function Panel     Component Value Date/Time   PROT 7.1 12/09/2013 0927   ALBUMIN 4.6 12/09/2013 0927   AST 15 12/09/2013 0927   ALT 15 12/09/2013 0927   ALKPHOS 66 12/09/2013 0927   BILITOT 0.6 12/09/2013 0927   BILIDIR <0.2 10/28/2013 1830   IBILI NOT CALCULATED 10/28/2013 1830     CBC    Component Value Date/Time   WBC 8.2 12/09/2013 0927   WBC 6.6 01/28/2013 0943   RBC 3.99* 12/09/2013 0927   RBC 5.05 01/28/2013 0943   HGB 13.3 12/09/2013 0927   HGB 15.4 01/28/2013 0943   HCT 39.1 12/09/2013 0927   HCT 49.6 01/28/2013 0943   PLT 304 12/09/2013 0927   MCV 98.0 12/09/2013 0927   MCV 98.3* 01/28/2013 0943   MCH 33.3 12/09/2013 0927   MCH 30.5 01/28/2013 0943   MCHC 34.0 12/09/2013 0927   MCHC 31.0* 01/28/2013 0943   RDW 14.2 12/09/2013 0927   LYMPHSABS 1.6 11/08/2013 1215   MONOABS 1.5* 11/08/2013 1215   EOSABS 0.1 11/08/2013 1215   BASOSABS 0.1 11/08/2013 1215     BNP    Component Value Date/Time   PROBNP 1341.0* 11/08/2013 1215    Lipid Panel     Component Value Date/Time   CHOL 225* 12/09/2013 0927   TRIG 158* 12/09/2013 0927   HDL 52 12/09/2013 0927   CHOLHDL 4.3 12/09/2013 0927   VLDL 32 12/09/2013 0927   LDLCALC 141* 12/09/2013 0927     RADIOLOGY: Dg Chest 2 View  10/08/2012   CLINICAL DATA:  Chest pain  EXAM: CHEST  2 VIEW  COMPARISON:  02/12/2009.  FINDINGS: Mild hyperinflation of the  lungs. Prior CABG. Heart and mediastinal contours are within normal limits. No focal opacities or effusions. No acute bony abnormality.  IMPRESSION: No active cardiopulmonary disease.   Electronically Signed   By: Charlett Nose   On: 10/08/2012 14:55   Ct Head Wo Contrast  10/08/2012   *RADIOLOGY REPORT*  Clinical Data: Near-syncope  CT HEAD WITHOUT CONTRAST  Technique:  Contiguous axial images were obtained from the base of the skull through the vertex without contrast.  Comparison: 05/06/2007  Findings: No evidence of parenchymal  hemorrhage or extra-axial fluid collection. No mass lesion, mass effect, or midline shift.  No CT evidence of acute infarction.  Subcortical white matter and periventricular small vessel ischemic changes.  Intracranial atherosclerosis.  Mild global cortical atrophy, likely age appropriate.  No ventriculomegaly.  The visualized paranasal sinuses are essentially clear. The mastoid air cells are unopacified.  No evidence of calvarial fracture.  IMPRESSION: No evidence of acute intracranial abnormality.  Mild atrophy with small vessel ischemic changes and intracranial atherosclerosis.   Original Report Authenticated By: Charline Bills, M.D.      ASSESSMENT AND PLAN:  Mr. Limbaugh is a 73 year old white male who is status post CABG surgery in 1997 and has undergone several interventions in 2006, 2011 and most recently underwent for lesion intervention to the vein graft supplying his RCA.  His chest pain. He denies recurrent anginal symptoms following intervention with additional stenting to the SVG as well as to his distal native PLA.  He has developed recurrent atrial fibrillation which has been documented since mid September 2015.  He was started on warfarin anticoagulation.  When I saw him one month ago in light of his concomitant CAD.  I added amiodarone.  I was expecting him to of had a month of 4 mg daily dosing, but unfortunately he has a only been taking the 200 mg dose.  I have recommended he increase this to 400 mg daily. I reviewed his recent laboratory from 12/09/2013.  He has not been consistently taking his medication with reference to his lipid lowering drug, which is verified by his lipid panel which showed an increased total cholesterol 225, triglycerides 158, and LDL cholesterol 141.  I also had a further discussion with him concerning the  importance of treating his sleep apnea, particularly with his cardiovascular comorbidities and his atrial fibrillation.  For this reason, I'm scheduling him  for split-night study to be done at [facility] so that CPAP therapy can be instituted, which hopefully will be beneficial in him ultimately maintaining sinus rhythm.  I will see him in one month for follow up evaluation or sooner if problems arise.  Time spent: 25 minutes  Lennette Bihari, MD, [facility]  12/15/2013 10:40 AM

## 2013-12-15 NOTE — Patient Instructions (Addendum)
Your physician has recommended that you have a split night sleep study. This test records several body functions during sleep, including: brain activity, eye movement, oxygen and carbon dioxide blood levels, heart rate and rhythm, breathing rate and rhythm, the flow of air through your mouth and nose, snoring, body muscle movements, and chest and belly movement.  Your physician has recommended you make the following change in your medication: take the amiodarone 200mg  twice daily.  Your physician recommends that you schedule a follow-up appointment in: 4-6 weeks.

## 2013-12-16 ENCOUNTER — Encounter: Payer: Self-pay | Admitting: Cardiovascular Disease

## 2013-12-19 ENCOUNTER — Telehealth: Payer: Self-pay | Admitting: Physician Assistant

## 2013-12-19 NOTE — Telephone Encounter (Signed)
Patient's wife called after-hours answering service with question. They saw Dr. Tresa Endo on 12/15/13 at which time amiodarone was increased to 400mg  daily in hopes to load him since he was still in atrial fibrillation. Over the last week he has begun to feel worse. He reports weakness, poor appetite, upset stomach, and occasional cold sweats. No fever, chest pain, or SOB. They have not checked his BP. He feels that he is still in atrial fib. I told her unfortunately I am not able to attribute all these symptoms to the amiodarone increase. His family has asked him to go the ED but he declined. I told his wife that I agree that an in-person evaluation would be helpful since there are a whole number of things that could be going on and I just don't have enough information to make a comprehensive clinical assessment. For example, his last labs in October showed acute renal insufficiency from baseline and hypomagnesemia. Despite this information he does not want to go to the ER tonight. I advised they back down to amiodarone 200mg  daily and if still feeling poorly in the morning to come in to be checked out. She verbalized understanding and gratitude. Will also forward to Dr. November as . Dayna Dunn PA-C

## 2013-12-22 ENCOUNTER — Telehealth: Payer: Self-pay | Admitting: Cardiovascular Disease

## 2013-12-22 NOTE — Telephone Encounter (Signed)
OK; I was hoping to load him with the amiodarone prior to attempt at cardioversion. If he cannot tolerate another attempt at 200 bid then continue 200mg .

## 2013-12-22 NOTE — Telephone Encounter (Signed)
Pt's wife called in stating that he has been having problems with his heartbeat being irregular since Sept and she states that he is getting worse. She states the his heart beats are still out of rhythm, he has lost 5 lbs in the last week, and he has had a loss in appetite. Please call  Thanks

## 2013-12-22 NOTE — Telephone Encounter (Signed)
Spoke with wife She states she is very concerned about her husband. She states he has loss weight- 5 lbs since his visit with Dr Tresa Endo last week. She states she is very weak , no energy, has some cold sweats. Wife states she called the doctor on call Friday. " He is worse" per wife Appointment made tomorrow at 2 pm with Dr Tresa Endo.

## 2013-12-23 ENCOUNTER — Encounter: Payer: Self-pay | Admitting: Cardiovascular Disease

## 2013-12-23 ENCOUNTER — Ambulatory Visit (INDEPENDENT_AMBULATORY_CARE_PROVIDER_SITE_OTHER): Payer: Medicare Other | Admitting: Cardiovascular Disease

## 2013-12-23 ENCOUNTER — Ambulatory Visit (INDEPENDENT_AMBULATORY_CARE_PROVIDER_SITE_OTHER): Payer: Medicare Other | Admitting: *Deleted

## 2013-12-23 VITALS — BP 111/79 | HR 128 | Ht 71.0 in | Wt 204.0 lb

## 2013-12-23 DIAGNOSIS — G4733 Obstructive sleep apnea (adult) (pediatric): Secondary | ICD-10-CM

## 2013-12-23 DIAGNOSIS — I5033 Acute on chronic diastolic (congestive) heart failure: Secondary | ICD-10-CM

## 2013-12-23 DIAGNOSIS — I4891 Unspecified atrial fibrillation: Secondary | ICD-10-CM

## 2013-12-23 DIAGNOSIS — Z7901 Long term (current) use of anticoagulants: Secondary | ICD-10-CM

## 2013-12-23 DIAGNOSIS — I2510 Atherosclerotic heart disease of native coronary artery without angina pectoris: Secondary | ICD-10-CM

## 2013-12-23 DIAGNOSIS — E785 Hyperlipidemia, unspecified: Secondary | ICD-10-CM

## 2013-12-23 DIAGNOSIS — I480 Paroxysmal atrial fibrillation: Secondary | ICD-10-CM

## 2013-12-23 DIAGNOSIS — Z01812 Encounter for preprocedural laboratory examination: Secondary | ICD-10-CM

## 2013-12-23 DIAGNOSIS — G4730 Sleep apnea, unspecified: Secondary | ICD-10-CM

## 2013-12-23 LAB — POCT INR: INR: 3.4

## 2013-12-23 MED ORDER — FUROSEMIDE 20 MG PO TABS: ORAL_TABLET | ORAL | Status: DC

## 2013-12-23 MED ORDER — DIGOXIN 125 MCG PO TABS: 0.1250 mg | ORAL_TABLET | Freq: Every day | ORAL | Status: DC

## 2013-12-23 NOTE — Progress Notes (Signed)
Patient ID: [name], male   DOB: [birth year], 73 y.o.   MRN: [id number]     HPI: [name] is a 73 y.o. male who presents to the office for follow-up cardiology evaluation with complaints of increasing weakness and shortness of breath  [name]  has established coronary artery disease dating back to 1997 as is s/p CABGx5 revascularization surgery. In 2006 he underwent stenting of the midportion of the graft to the right coronary artery and cutting balloon to the LAD after the LIMA insertion. On New Year's Eve 2011 he developed an acute coronary syndrome was found to have a new subtotal stenosis in the graft to right coronary artery more distal to the prior stent and also had in-stent narrowing to the proximal portion of the previously placed stent. He underwent successful intervention with a 3.5x18 mm stent. His LIMA to the LAD was patent. The graft to the circumflex was occluded. He had 90% stenosis in a small marginal vessel. He does have a history of hypertension, type 2 diabetes mellitus, as well as hyperlipidemia. His last nuclear perfusion study was in February 2014 which continued to show normal perfusion without scar or ischemia. An echo Doppler study at that time showed an ejection fraction of 55-60% with mild aortic sclerosis without stenosis. He had mild pulmonary hypertension with a PA pressure 32 mm.  In August 2014, he developed a presyncopal spell after he'd been dragging and unloading logs from a stroller. He apparently was evaluated in the emergency room. Head CT did not show acute abnormalities although did suggest small vessel disease. Chest x-ray was normal. Cardiac enzymes were negative. Hemoglobin was 13.6 hematocrit 37.3. Renal function revealed a BUN of 25 currently 1.13. I saw him in the office several days later and felt that his symptoms occurred in the setting of mild dehydration with significant straining possibly inducing a vagal event while he was dragging  heavy logs.   He does have a history of Barrett's esophagus. Recently, he has developed episodes of chest pain typically after he eats. This has been associated with a sensation of increased gas. He feels symptoms radiating from his stomach into his esophagus. Recently, he started taking over-the-counter probiotics and some of his abdominal bloating and gas has significantly improved. He denies recent chest pressure that is similar to his prior angina. He denies any change in shortness of breath development. He recently saw the PA for Dr. Arlyce Dice [facility]. Because of this chest pain history it was recommended that he see me prior to planned endoscopy when I saw him in November 2014.  He did undergo his procedure. He tolerated it well. He also tells me he is being cared for by the by Dr. Dierdre Forth for his arthritis. He denies any exertional chest pain. He denies shortness of breath. He is unaware of palpitations. He does have diabetes. He has been taking his Bentyl prednisone for his Barrett's esophagus and GERD.  In the spring of 2015, [name] had noticed recurrent episodes of chest pain.  He ultimately underwent cardiac catheterization on 07/30/2013 which showed significant native CAD with proximal 80% LAD stenosis and total mid LAD occlusion, 90% circumflex stenosis.  A total proximal occlusion of the native RCA.  He had a patent LIMA graft to the LAD.  The graft, which had supplied the circumflex marginal vessel and had previously been demonstrated to be occluded remained occluded.  He now had progressive disease in the vein graft supplying the right  coronary artery and 4 lesion intervention was performed to the  saphenous vein graft proximally, mid, distally with DES stenting and he also underwent PTCA to the PLA branch of the distal native RCA.  Subsequently he noticed improvement in his previous chest pain.  He was admitted to [facility] on 10/28/2013 with atrial fibrillation with a rapid  ventricular response.  Cardiac enzymes were negative.  An echo Doppler study showed an EF of 55% with basal inferior severe hypokinesis.  There were no significant valvular abnormalities.  He was started on warfarin therapy.  He was readmitted on November 08 2013  with acute on chronic diastolic CHF.  It was felt that his atrial fibrillation was causing the CHF.   Over the last several months, he has been started on amiodarone initially was to only take 20 mg for 1 week and then titrate this to 400 mg.  He never done this and when I saw him back 1 month later, he was still on the 200 mg daily.  When I saw him last week, I recommended titration of his amiodarone to 200 mg twice a day.  Apparently, several days later he felt that he was more fatigue.  He did not feel that he tolerated this.  He has spoke with Lucile Crater, PA-C, who advised that he return back to the 20 mg.  He now presents to the office today and was added on for further evaluation.  He does have obstructive sleep apnea but has not been utilizing CPAP therapy for over 10-15 years. He states she typically goes to bed between 9 and 9:30 PM and wakes up at 8 AM.  He does significantly snore and wakes up at least 3 times per night.  When I saw him last, I had scheduled him for a sleep study.  Apparently this has not yet been done and he tells me this is scheduled for immediately after the new year.  Past Medical History  Diagnosis Date  . Coronary artery disease     a. h/o CABG 1997. b. stenting to graft-RCA, CB to LAD after LIMA in 2006. c. stenting to VG-RCA beyond initial stent and in-stent restenosis in 2011. c. Complex PCI 07/2013 to SVG-RCA and distal native RCA involving 4 sites (see cath report).  . Hypertension   . Hyperlipidemia   . Diabetes   . Chronic kidney disease   . Myocardial infarction   . GERD (gastroesophageal reflux disease)   . Arthritis   . Sleep apnea     hx of   . Barrett's esophagus   . Pre-syncope     a.  09/2012: in the setting of mild dehydration with significant straining possibly inducing a vagal event while he was dragging heavy logs.     Past Surgical History  Procedure Laterality Date  . Coronary angioplasty with stent placement    . Coronary artery bypass graft    . Joint replacement    . Coronary stent placement  07/30/2013    DES to RCA     DR KELLY  . Hernia repair    . Shoulder surgery    . Kidney stone surgery      Allergies  Allergen Reactions  . Ivp Dye [Iodinated Diagnostic Agents] Other (See Comments)    Increased blood pressure, heart rate    Current Outpatient Prescriptions  Medication Sig Dispense Refill  . amiodarone (PACERONE) 200 MG tablet Take 1 tablet daily for 1 week then 1 tablet twice daily thereafter. (  Patient taking differently: Take 1 tablet twice daily) 60 tablet 6  . carvedilol (COREG) 25 MG tablet Take 25 mg by mouth 2 (two) times daily with a meal.    . clopidogrel (PLAVIX) 75 MG tablet TAKE 1 TABLET EVERY DAY 30 tablet 11  . diltiazem (CARDIZEM CD) 120 MG 24 hr capsule Take 1 capsule (120 mg total) by mouth 2 (two) times daily before a meal. 60 capsule 5  . ezetimibe-simvastatin (VYTORIN) 10-20 MG per tablet Take 1 tablet by mouth daily.    . folic acid (FOLVITE) 1 MG tablet Take 1 mg by mouth daily with lunch.     . furosemide (LASIX) 20 MG tablet Take 1 tablet (20 mg total) by mouth daily. 30 tablet 11  . glimepiride (AMARYL) 1 MG tablet Take 1 mg by mouth daily.     Marland Kitchen losartan (COZAAR) 50 MG tablet Take 50 mg by mouth 2 (two) times daily.     . metFORMIN (GLUCOPHAGE) 1000 MG tablet Take 1,000 mg by mouth 2 (two) times daily with a meal.    . nitroGLYCERIN (NITROSTAT) 0.4 MG SL tablet Place 0.4 mg under the tongue every 5 (five) minutes as needed for chest pain.    . ONE TOUCH ULTRA TEST test strip   2  . potassium chloride SA (K-DUR,KLOR-CON) 20 MEQ tablet Take 1 tablet (20 mEq total) by mouth daily. 30 tablet 11  . predniSONE (DELTASONE) 5 MG  tablet Take 4 mg by mouth daily with breakfast.     . warfarin (COUMADIN) 5 MG tablet Take 1 tablet (5 mg total) by mouth daily. 90 tablet 3   No current facility-administered medications for this visit.    History   Social History  . Marital Status: Married    Spouse Name: N/A    Number of Children: 1  . Years of Education: N/A   Occupational History  . Retired    Social History Main Topics  . Smoking status: Never Smoker   . Smokeless tobacco: Never Used  . Alcohol Use: Yes     Comment: 2-3 beers a month.  . Drug Use: No  . Sexual Activity: Not on file   Other Topics Concern  . Not on file   Social History Narrative   Lives with family   Social history notable that he is married he has one child. There is no tobacco use. He does not routinely exercise. He does drink alcohol infrequently.  Family History  Problem Relation Age of Onset  . Heart attack Brother   . Heart disease Brother   . Diabetes Mother   . Heart disease Mother   . Hypertension Mother   . Cancer Father   . Diabetes Sister   . Hypertension Sister   . Heart disease Brother   . Diabetes Brother   . Colon cancer Neg Hx     ROS General: Negative; No fevers, chills, or night sweats; positive for increased weakness HEENT: Negative; No changes in vision or hearing, sinus congestion, difficulty swallowing Pulmonary: Negative; No cough, wheezing,, hemoptysis Cardiovascular: See history of present illness GI: Positive for history of abdominal bloating No nausea, vomiting, diarrhea, or abdominal pain GU: Negative; No dysuria, hematuria, or difficulty voiding Musculoskeletal: Negative; no myalgias, joint pain, or weakness Hematologic/Oncology: Negative; no easy bruising, bleeding Endocrine: Positive for diabetes; no heat/cold intolerance; Neuro: Negative; no changes in balance, headaches Skin: Negative; No rashes or skin lesions Psychiatric: Negative; No behavioral problems, depression Sleep: Positive  for sleep  apnea and snoring, daytime sleepiness, hypersomnolence, bruxism, restless legs, hypnogognic hallucinations, no cataplexy Other comprehensive 14 point system review is negative.  PE BP 111/79 mmHg  Pulse 128  Ht 5\' 11"  (1.803 m)  Wt 204 lb (92.534 kg)  BMI 28.46 kg/m2  General: Alert, oriented, no distress.  Skin: normal turgor, no rashes HEENT: Normocephalic, atraumatic. Pupils round and reactive; sclera anicteric;no lid lag. No xanthelasmas. Nose without nasal septal hypertrophy Mouth/Parynx benign; Mallinpatti scale 3 Neck: No JVD, no carotid bruits with normal carotid upstroke Lungs: clear to ausculatation and percussion; no wheezing or rales Heart: irregularly irregular with a ventricular rate at ~100 bpm, s1 s2 normal 1-2/6 systolic murmur. No diastolic murmur. No rub thrills or heaves. No S3  Abdomen: Moderate central adiposity soft, nontender; no hepatosplenomehaly, BS+; abdominal aorta nontender and not dilated by palpation. Back: No CVA tenderness Pulses 2+ Extremities: no clubbing cyanosis or edema, Homan's sign negative  Neurologic: grossly nonfocal Psychologic: Normal affect and mood  ECG (independently read by me and (: Atrial fibrillation at 100 bpm.  T-wave changes inferolaterally.  QTc interval 453 ms  December 15, 2013 ECG (independently read by me): atrial fibrillation at 87 bpm.  RV conduction delay.  Inferolateral, lateral T-wave changes.  QTc interval 476 ms  October 2015 ECG (independently read by me): Atrial fibrillation at 79 beats per minute.  LVH by voltage criteria.  ST-T abnormality laterally  Prior June 2015 ECG (independently read by me): Normal sinus rhythm at 64 beats per minute.  Inferior lateral T-wave abnormality  Prior February 2015 ECG (independently read by me): Sinus rhythm with inferolateral ST-T changes, unchanged  Prior ECG of 01/03/2013: Sinus rhythm at 82 beats per minute with previously noted the inferolateral T-wave  abnormalities; however these T changes appear slightly more prominent than his last ECG in are diffuse. Probable LVH with repolarization changes; incomplete right bundle branch block  LABS:  BMET    Component Value Date/Time   NA 141 12/09/2013 0927   K 4.6 12/09/2013 0927   CL 102 12/09/2013 0927   CO2 26 12/09/2013 0927   GLUCOSE 112* 12/09/2013 0927   BUN 29* 12/09/2013 0927   CREATININE 1.46* 12/09/2013 0927   CREATININE 1.08 11/10/2013 0455   CALCIUM 9.5 12/09/2013 0927   GFRNONAA 66* 11/10/2013 0455   GFRAA 77* 11/10/2013 0455     Hepatic Function Panel     Component Value Date/Time   PROT 7.1 12/09/2013 0927   ALBUMIN 4.6 12/09/2013 0927   AST 15 12/09/2013 0927   ALT 15 12/09/2013 0927   ALKPHOS 66 12/09/2013 0927   BILITOT 0.6 12/09/2013 0927   BILIDIR <0.2 10/28/2013 1830   IBILI NOT CALCULATED 10/28/2013 1830     CBC    Component Value Date/Time   WBC 8.2 12/09/2013 0927   WBC 6.6 01/28/2013 0943   RBC 3.99* 12/09/2013 0927   RBC 5.05 01/28/2013 0943   HGB 13.3 12/09/2013 0927   HGB 15.4 01/28/2013 0943   HCT 39.1 12/09/2013 0927   HCT 49.6 01/28/2013 0943   PLT 304 12/09/2013 0927   MCV 98.0 12/09/2013 0927   MCV 98.3* 01/28/2013 0943   MCH 33.3 12/09/2013 0927   MCH 30.5 01/28/2013 0943   MCHC 34.0 12/09/2013 0927   MCHC 31.0* 01/28/2013 0943   RDW 14.2 12/09/2013 0927   LYMPHSABS 1.6 11/08/2013 1215   MONOABS 1.5* 11/08/2013 1215   EOSABS 0.1 11/08/2013 1215   BASOSABS 0.1 11/08/2013 1215     BNP  Component Value Date/Time   PROBNP 1341.0* 11/08/2013 1215    Lipid Panel     Component Value Date/Time   CHOL 225* 12/09/2013 0927   TRIG 158* 12/09/2013 0927   HDL 52 12/09/2013 0927   CHOLHDL 4.3 12/09/2013 0927   VLDL 32 12/09/2013 0927   LDLCALC 141* 12/09/2013 0927     RADIOLOGY: Dg Chest 2 View  10/08/2012   CLINICAL DATA:  Chest pain  EXAM: CHEST  2 VIEW  COMPARISON:  02/12/2009.  FINDINGS: Mild hyperinflation of the  lungs. Prior CABG. Heart and mediastinal contours are within normal limits. No focal opacities or effusions. No acute bony abnormality.  IMPRESSION: No active cardiopulmonary disease.   Electronically Signed   By: Kevin  Dover   On: 10/08/2012 14:55   Ct Head Wo Contrast  10/08/2012   *RADIOLOGY REPORT*  Clinical Data: Near-syncope  CT HEAD WITHOUT CONTRAST  Technique:  Contiguous axial images were obtained from the base of the skull through the vertex without contrast.  Comparison: 05/06/2007  Findings: No evidence of parenchymal hemorrhage or extra-axial fluid collection. No mass lesion, mass effect, or midline shift.  No CT evidence of acute infarction.  Subcortical white matter and periventricular small vessel ischemic changes.  Intracranial atherosclerosis.  Mild global cortical atrophy, likely age appropriate.  No ventriculomegaly.  The visualized paranasal sinuses are essentially clear. The mastoid air cells are unopacified.  No evidence of calvarial fracture.  IMPRESSION: No evidence of acute intracranial abnormality.  Mild atrophy with small vessel ischemic changes and intracranial atherosclerosis.   Original Report Authenticated By: Sriyesh Krishnan, M.D.      ASSESSMENT AND PLAN:  [name] is a 72-year-old white male who is status post CABG surgery in 1997 and has undergone several interventions in 2006, 2011 and most recently underwent for lesion intervention to the vein graft supplying his RCA. He denies recurrent anginal symptoms following intervention with additional stenting to the SVG as well as to his distal native PLA.  He has developed recurrent atrial fibrillation which has been documented since mid September 2015.  He was started on warfarin anticoagulation.  He now has been on amiodarone for at least 6 weeks.  When last seen I attempted to further titrate this to 200 mg twice a day, but he felt that this created more weakness and consequently he is back on just 200 mg.daily  His  ventricular rate is not well controlled.  I'm adding digoxin 0.125 mg to his medical regimen.  His INR today was 3.4, which is therapeutic.  We will now try to expedite his attempt at cardioversion and will schedule him for this to be done next week.  I discussed risk, benefits of the procedure.  He also has not yet had his sleep study and he needs to be restarted on CPAP therapy.  We will see if there is anyway his sleep study can be moved up from the current date of 02/15/2014. His lipid studies remain elevated.  Since he is on amiodarone.  He will not have any further titration of his Vytorin.  This may ultimately need to be changed to a different agent for more aggressive LDL lowering with a target LDL less than 70.  I will see him in the office several weeks following his cardioversion and further recommendations will be made at that time.   Time spent: 25 minutes  Thomas A. Kelly, MD, FACC  12/23/2013 2:57 PM    

## 2013-12-23 NOTE — Patient Instructions (Signed)
Your physician has recommended you make the following change in your medication: start new prescription for lanoxin 0.125 mg. Take  the furosemide only as needed for swelling.  Your physician recommends that you return for lab work within 7 days of your cardioversion.  Your physician has recommended that you have a Cardioversion (DCCV). Electrical Cardioversion uses a jolt of electricity to your heart either through paddles or wired patches attached to your chest. This is a controlled, usually prescheduled, procedure. Defibrillation is done under light anesthesia in the hospital, and you usually go home the day of the procedure. This is done to get your heart back into a normal rhythm. You are not awake for the procedure. Please see the instruction sheet given to you today.  Your physician recommends that you schedule a follow-up appointment 2 weeks after the procedure. This is usually scheduled with a NP or PA and will be scheduled at the time of your discharge.

## 2013-12-24 ENCOUNTER — Encounter: Payer: Self-pay | Admitting: Cardiovascular Disease

## 2013-12-25 ENCOUNTER — Other Ambulatory Visit: Payer: Self-pay | Admitting: *Deleted

## 2013-12-25 ENCOUNTER — Encounter: Payer: Self-pay | Admitting: Cardiovascular Disease

## 2013-12-25 DIAGNOSIS — Z01818 Encounter for other preprocedural examination: Secondary | ICD-10-CM

## 2013-12-25 LAB — CBC
HEMATOCRIT: 35.8 % — AB (ref 39.0–52.0)
HEMOGLOBIN: 12.2 g/dL — AB (ref 13.0–17.0)
MCH: 31.9 pg (ref 26.0–34.0)
MCHC: 34.1 g/dL (ref 30.0–36.0)
MCV: 93.7 fL (ref 78.0–100.0)
Platelets: 311 10*3/uL (ref 150–400)
RBC: 3.82 MIL/uL — ABNORMAL LOW (ref 4.22–5.81)
RDW: 14.4 % (ref 11.5–15.5)
WBC: 8.2 10*3/uL (ref 4.0–10.5)

## 2013-12-25 LAB — BASIC METABOLIC PANEL
BUN: 37 mg/dL — ABNORMAL HIGH (ref 6–23)
CO2: 21 mEq/L (ref 19–32)
Calcium: 10.1 mg/dL (ref 8.4–10.5)
Chloride: 106 mEq/L (ref 96–112)
Creat: 1.35 mg/dL (ref 0.50–1.35)
GLUCOSE: 100 mg/dL — AB (ref 70–99)
Potassium: 5.6 mEq/L — ABNORMAL HIGH (ref 3.5–5.3)
Sodium: 140 mEq/L (ref 135–145)

## 2013-12-25 LAB — PROTIME-INR
INR: 3.59 — AB (ref <1.50)
Prothrombin Time: 35.8 seconds — ABNORMAL HIGH (ref 11.6–15.2)

## 2013-12-25 LAB — APTT: aPTT: 71 seconds — ABNORMAL HIGH (ref 24–37)

## 2013-12-29 ENCOUNTER — Encounter (HOSPITAL_COMMUNITY): Payer: Self-pay | Admitting: Certified Registered Nurse Anesthetist

## 2013-12-29 ENCOUNTER — Telehealth: Payer: Self-pay | Admitting: *Deleted

## 2013-12-29 ENCOUNTER — Encounter (HOSPITAL_COMMUNITY)
Admission: RE | Disposition: A | Discharge status: Home / Self Care | Payer: Medicare Other | Source: Ambulatory Visit | Attending: Cardiology

## 2013-12-29 ENCOUNTER — Ambulatory Visit (HOSPITAL_COMMUNITY): Payer: Medicare Other | Admitting: Anesthesiology

## 2013-12-29 ENCOUNTER — Ambulatory Visit: Payer: Medicare Other | Admitting: Pharmacist Clinician (PhC)/ Clinical Pharmacy Specialist

## 2013-12-29 ENCOUNTER — Other Ambulatory Visit: Payer: Self-pay

## 2013-12-29 ENCOUNTER — Ambulatory Visit (HOSPITAL_COMMUNITY)
Admission: RE | Admit: 2013-12-29 | Discharge: 2013-12-29 | Disposition: A | Discharge status: Home / Self Care | Payer: Medicare Other | Source: Ambulatory Visit | Attending: Cardiology | Admitting: Cardiology

## 2013-12-29 DIAGNOSIS — K219 Gastro-esophageal reflux disease without esophagitis: Secondary | ICD-10-CM | POA: Diagnosis not present

## 2013-12-29 DIAGNOSIS — Z01818 Encounter for other preprocedural examination: Secondary | ICD-10-CM

## 2013-12-29 DIAGNOSIS — N189 Chronic kidney disease, unspecified: Secondary | ICD-10-CM | POA: Diagnosis not present

## 2013-12-29 DIAGNOSIS — Z951 Presence of aortocoronary bypass graft: Secondary | ICD-10-CM | POA: Insufficient documentation

## 2013-12-29 DIAGNOSIS — I2510 Atherosclerotic heart disease of native coronary artery without angina pectoris: Secondary | ICD-10-CM | POA: Diagnosis not present

## 2013-12-29 DIAGNOSIS — Z7902 Long term (current) use of antithrombotics/antiplatelets: Secondary | ICD-10-CM | POA: Insufficient documentation

## 2013-12-29 DIAGNOSIS — E119 Type 2 diabetes mellitus without complications: Secondary | ICD-10-CM | POA: Diagnosis not present

## 2013-12-29 DIAGNOSIS — I129 Hypertensive chronic kidney disease with stage 1 through stage 4 chronic kidney disease, or unspecified chronic kidney disease: Secondary | ICD-10-CM | POA: Insufficient documentation

## 2013-12-29 DIAGNOSIS — E785 Hyperlipidemia, unspecified: Secondary | ICD-10-CM | POA: Insufficient documentation

## 2013-12-29 DIAGNOSIS — I4891 Unspecified atrial fibrillation: Secondary | ICD-10-CM

## 2013-12-29 DIAGNOSIS — I252 Old myocardial infarction: Secondary | ICD-10-CM | POA: Insufficient documentation

## 2013-12-29 DIAGNOSIS — Z8249 Family history of ischemic heart disease and other diseases of the circulatory system: Secondary | ICD-10-CM | POA: Diagnosis not present

## 2013-12-29 DIAGNOSIS — Z7901 Long term (current) use of anticoagulants: Secondary | ICD-10-CM | POA: Diagnosis not present

## 2013-12-29 DIAGNOSIS — Z955 Presence of coronary angioplasty implant and graft: Secondary | ICD-10-CM | POA: Insufficient documentation

## 2013-12-29 DIAGNOSIS — I5033 Acute on chronic diastolic (congestive) heart failure: Secondary | ICD-10-CM | POA: Diagnosis not present

## 2013-12-29 DIAGNOSIS — G4733 Obstructive sleep apnea (adult) (pediatric): Secondary | ICD-10-CM | POA: Diagnosis not present

## 2013-12-29 HISTORY — PX: CARDIOVERSION: SHX1299

## 2013-12-29 LAB — PROTIME-INR
INR: 3.13 — ABNORMAL HIGH (ref 0.00–1.49)
Prothrombin Time: 32.4 seconds — ABNORMAL HIGH (ref 11.6–15.2)

## 2013-12-29 LAB — POCT I-STAT 4, (NA,K, GLUC, HGB,HCT)
GLUCOSE: 98 mg/dL (ref 70–99)
HCT: 36 % — ABNORMAL LOW (ref 39.0–52.0)
HEMOGLOBIN: 12.2 g/dL — AB (ref 13.0–17.0)
Potassium: 4.4 mEq/L (ref 3.7–5.3)
Sodium: 138 mEq/L (ref 137–147)

## 2013-12-29 LAB — GLUCOSE, CAPILLARY: GLUCOSE-CAPILLARY: 89 mg/dL (ref 70–99)

## 2013-12-29 SURGERY — CARDIOVERSION
Anesthesia: General

## 2013-12-29 MED ORDER — HYDROCORTISONE 1 % EX CREA
1.0000 "application " | TOPICAL_CREAM | Freq: Three times a day (TID) | CUTANEOUS | Status: DC | PRN
  Filled 2013-12-29: qty 28

## 2013-12-29 MED ORDER — SODIUM CHLORIDE 0.9 % IV SOLN
INTRAVENOUS | Status: DC | PRN
  Administered 2013-12-29: 10:00:00 via INTRAVENOUS

## 2013-12-29 MED ORDER — SODIUM CHLORIDE 0.9 % IV SOLN: 250.0000 mL | INTRAVENOUS | Status: DC

## 2013-12-29 MED ORDER — PROPOFOL 10 MG/ML IV BOLUS
INTRAVENOUS | Status: DC | PRN
  Administered 2013-12-29: 100 mg via INTRAVENOUS

## 2013-12-29 MED ORDER — SODIUM CHLORIDE 0.9 % IV SOLN
INTRAVENOUS | Status: DC
  Administered 2013-12-29: 500 mL via INTRAVENOUS

## 2013-12-29 MED ORDER — LOSARTAN POTASSIUM 50 MG PO TABS: 50.0000 mg | ORAL_TABLET | Freq: Two times a day (BID) | ORAL | Status: DC

## 2013-12-29 MED ORDER — SODIUM CHLORIDE 0.9 % IJ SOLN: 3.0000 mL | Freq: Two times a day (BID) | INTRAMUSCULAR | Status: DC

## 2013-12-29 MED ORDER — SODIUM CHLORIDE 0.9 % IJ SOLN: 3.0000 mL | INTRAMUSCULAR | Status: DC | PRN

## 2013-12-29 NOTE — Anesthesia Preprocedure Evaluation (Addendum)
Anesthesia Evaluation  Patient identified by MRN, date of birth, ID band Patient awake    Reviewed: Allergy & Precautions, H&P , NPO status , Patient's Chart, lab work & pertinent test results, reviewed documented beta blocker date and time   Airway Mallampati: II  TM Distance: >3 FB Neck ROM: Full    Dental  (+) Teeth Intact, Dental Advisory Given   Pulmonary sleep apnea ,          Cardiovascular hypertension, Pt. on medications + CAD, + Past MI and +CHF Rhythm:Irregular     Neuro/Psych    GI/Hepatic   Endo/Other  diabetes, Well Controlled, Type 2  Renal/GU CRFRenal disease     Musculoskeletal   Abdominal   Peds  Hematology   Anesthesia Other Findings   Reproductive/Obstetrics                            Anesthesia Physical Anesthesia Plan  ASA: III  Anesthesia Plan: General   Post-op Pain Management:    Induction: Intravenous  Airway Management Planned: Mask  Additional Equipment:   Intra-op Plan:   Post-operative Plan:   Informed Consent: I have reviewed the patients History and Physical, chart, labs and discussed the procedure including the risks, benefits and alternatives for the proposed anesthesia with the patient or authorized representative who has indicated his/her understanding and acceptance.   Dental advisory given  Plan Discussed with: CRNA and Surgeon  Anesthesia Plan Comments:        Anesthesia Quick Evaluation

## 2013-12-29 NOTE — H&P (View-Only) (Signed)
Patient ID: [name], male   DOB: [birth year], 73 y.o.   MRN: [id number]     HPI: [name] is a 73 y.o. male who presents to the office for follow-up cardiology evaluation with complaints of increasing weakness and shortness of breath  [name]  has established coronary artery disease dating back to 1997 as is s/p CABGx5 revascularization surgery. In 2006 he underwent stenting of the midportion of the graft to the right coronary artery and cutting balloon to the LAD after the LIMA insertion. On New Year's Eve 2011 he developed an acute coronary syndrome was found to have a new subtotal stenosis in the graft to right coronary artery more distal to the prior stent and also had in-stent narrowing to the proximal portion of the previously placed stent. He underwent successful intervention with a 3.5x18 mm stent. His LIMA to the LAD was patent. The graft to the circumflex was occluded. He had 90% stenosis in a small marginal vessel. He does have a history of hypertension, type 2 diabetes mellitus, as well as hyperlipidemia. His last nuclear perfusion study was in February 2014 which continued to show normal perfusion without scar or ischemia. An echo Doppler study at that time showed an ejection fraction of 55-60% with mild aortic sclerosis without stenosis. He had mild pulmonary hypertension with a PA pressure 32 mm.  In August 2014, he developed a presyncopal spell after he'd been dragging and unloading logs from a stroller. He apparently was evaluated in the emergency room. Head CT did not show acute abnormalities although did suggest small vessel disease. Chest x-ray was normal. Cardiac enzymes were negative. Hemoglobin was 13.6 hematocrit 37.3. Renal function revealed a BUN of 25 currently 1.13. I saw him in the office several days later and felt that his symptoms occurred in the setting of mild dehydration with significant straining possibly inducing a vagal event while he was dragging  heavy logs.   He does have a history of Barrett's esophagus. Recently, he has developed episodes of chest pain typically after he eats. This has been associated with a sensation of increased gas. He feels symptoms radiating from his stomach into his esophagus. Recently, he started taking over-the-counter probiotics and some of his abdominal bloating and gas has significantly improved. He denies recent chest pressure that is similar to his prior angina. He denies any change in shortness of breath development. He recently saw the PA for Dr. Arlyce Dice [facility]. Because of this chest pain history it was recommended that he see me prior to planned endoscopy when I saw him in November 2014.  He did undergo his procedure. He tolerated it well. He also tells me he is being cared for by the by Dr. Dierdre Forth for his arthritis. He denies any exertional chest pain. He denies shortness of breath. He is unaware of palpitations. He does have diabetes. He has been taking his Bentyl prednisone for his Barrett's esophagus and GERD.  In the spring of 2015, [name] had noticed recurrent episodes of chest pain.  He ultimately underwent cardiac catheterization on 07/30/2013 which showed significant native CAD with proximal 80% LAD stenosis and total mid LAD occlusion, 90% circumflex stenosis.  A total proximal occlusion of the native RCA.  He had a patent LIMA graft to the LAD.  The graft, which had supplied the circumflex marginal vessel and had previously been demonstrated to be occluded remained occluded.  He now had progressive disease in the vein graft supplying the right  coronary artery and 4 lesion intervention was performed to the  saphenous vein graft proximally, mid, distally with DES stenting and he also underwent PTCA to the PLA branch of the distal native RCA.  Subsequently he noticed improvement in his previous chest pain.  He was admitted to [facility] on 10/28/2013 with atrial fibrillation with a rapid  ventricular response.  Cardiac enzymes were negative.  An echo Doppler study showed an EF of 55% with basal inferior severe hypokinesis.  There were no significant valvular abnormalities.  He was started on warfarin therapy.  He was readmitted on November 08 2013  with acute on chronic diastolic CHF.  It was felt that his atrial fibrillation was causing the CHF.   Over the last several months, he has been started on amiodarone initially was to only take 20 mg for 1 week and then titrate this to 400 mg.  He never done this and when I saw him back 1 month later, he was still on the 200 mg daily.  When I saw him last week, I recommended titration of his amiodarone to 200 mg twice a day.  Apparently, several days later he felt that he was more fatigue.  He did not feel that he tolerated this.  He has spoke with Lucile Crater, PA-C, who advised that he return back to the 20 mg.  He now presents to the office today and was added on for further evaluation.  He does have obstructive sleep apnea but has not been utilizing CPAP therapy for over 10-15 years. He states she typically goes to bed between 9 and 9:30 PM and wakes up at 8 AM.  He does significantly snore and wakes up at least 3 times per night.  When I saw him last, I had scheduled him for a sleep study.  Apparently this has not yet been done and he tells me this is scheduled for immediately after the new year.  Past Medical History  Diagnosis Date  . Coronary artery disease     a. h/o CABG 1997. b. stenting to graft-RCA, CB to LAD after LIMA in 2006. c. stenting to VG-RCA beyond initial stent and in-stent restenosis in 2011. c. Complex PCI 07/2013 to SVG-RCA and distal native RCA involving 4 sites (see cath report).  . Hypertension   . Hyperlipidemia   . Diabetes   . Chronic kidney disease   . Myocardial infarction   . GERD (gastroesophageal reflux disease)   . Arthritis   . Sleep apnea     hx of   . Barrett's esophagus   . Pre-syncope     a.  09/2012: in the setting of mild dehydration with significant straining possibly inducing a vagal event while he was dragging heavy logs.     Past Surgical History  Procedure Laterality Date  . Coronary angioplasty with stent placement    . Coronary artery bypass graft    . Joint replacement    . Coronary stent placement  07/30/2013    DES to RCA     DR Lyrika Souders  . Hernia repair    . Shoulder surgery    . Kidney stone surgery      Allergies  Allergen Reactions  . Ivp Dye [Iodinated Diagnostic Agents] Other (See Comments)    Increased blood pressure, heart rate    Current Outpatient Prescriptions  Medication Sig Dispense Refill  . amiodarone (PACERONE) 200 MG tablet Take 1 tablet daily for 1 week then 1 tablet twice daily thereafter. (  Patient taking differently: Take 1 tablet twice daily) 60 tablet 6  . carvedilol (COREG) 25 MG tablet Take 25 mg by mouth 2 (two) times daily with a meal.    . clopidogrel (PLAVIX) 75 MG tablet TAKE 1 TABLET EVERY DAY 30 tablet 11  . diltiazem (CARDIZEM CD) 120 MG 24 hr capsule Take 1 capsule (120 mg total) by mouth 2 (two) times daily before a meal. 60 capsule 5  . ezetimibe-simvastatin (VYTORIN) 10-20 MG per tablet Take 1 tablet by mouth daily.    . folic acid (FOLVITE) 1 MG tablet Take 1 mg by mouth daily with lunch.     . furosemide (LASIX) 20 MG tablet Take 1 tablet (20 mg total) by mouth daily. 30 tablet 11  . glimepiride (AMARYL) 1 MG tablet Take 1 mg by mouth daily.     Marland Kitchen losartan (COZAAR) 50 MG tablet Take 50 mg by mouth 2 (two) times daily.     . metFORMIN (GLUCOPHAGE) 1000 MG tablet Take 1,000 mg by mouth 2 (two) times daily with a meal.    . nitroGLYCERIN (NITROSTAT) 0.4 MG SL tablet Place 0.4 mg under the tongue every 5 (five) minutes as needed for chest pain.    . ONE TOUCH ULTRA TEST test strip   2  . potassium chloride SA (K-DUR,KLOR-CON) 20 MEQ tablet Take 1 tablet (20 mEq total) by mouth daily. 30 tablet 11  . predniSONE (DELTASONE) 5 MG  tablet Take 4 mg by mouth daily with breakfast.     . warfarin (COUMADIN) 5 MG tablet Take 1 tablet (5 mg total) by mouth daily. 90 tablet 3   No current facility-administered medications for this visit.    History   Social History  . Marital Status: Married    Spouse Name: N/A    Number of Children: 1  . Years of Education: N/A   Occupational History  . Retired    Social History Main Topics  . Smoking status: Never Smoker   . Smokeless tobacco: Never Used  . Alcohol Use: Yes     Comment: 2-3 beers a month.  . Drug Use: No  . Sexual Activity: Not on file   Other Topics Concern  . Not on file   Social History Narrative   Lives with family   Social history notable that he is married he has one child. There is no tobacco use. He does not routinely exercise. He does drink alcohol infrequently.  Family History  Problem Relation Age of Onset  . Heart attack Brother   . Heart disease Brother   . Diabetes Mother   . Heart disease Mother   . Hypertension Mother   . Cancer Father   . Diabetes Sister   . Hypertension Sister   . Heart disease Brother   . Diabetes Brother   . Colon cancer Neg Hx     ROS General: Negative; No fevers, chills, or night sweats; positive for increased weakness HEENT: Negative; No changes in vision or hearing, sinus congestion, difficulty swallowing Pulmonary: Negative; No cough, wheezing,, hemoptysis Cardiovascular: See history of present illness GI: Positive for history of abdominal bloating No nausea, vomiting, diarrhea, or abdominal pain GU: Negative; No dysuria, hematuria, or difficulty voiding Musculoskeletal: Negative; no myalgias, joint pain, or weakness Hematologic/Oncology: Negative; no easy bruising, bleeding Endocrine: Positive for diabetes; no heat/cold intolerance; Neuro: Negative; no changes in balance, headaches Skin: Negative; No rashes or skin lesions Psychiatric: Negative; No behavioral problems, depression Sleep: Positive  for sleep  apnea and snoring, daytime sleepiness, hypersomnolence, bruxism, restless legs, hypnogognic hallucinations, no cataplexy Other comprehensive 14 point system review is negative.  PE BP 111/79 mmHg  Pulse 128  Ht 5\' 11"  (1.803 m)  Wt 204 lb (92.534 kg)  BMI 28.46 kg/m2  General: Alert, oriented, no distress.  Skin: normal turgor, no rashes HEENT: Normocephalic, atraumatic. Pupils round and reactive; sclera anicteric;no lid lag. No xanthelasmas. Nose without nasal septal hypertrophy Mouth/Parynx benign; Mallinpatti scale 3 Neck: No JVD, no carotid bruits with normal carotid upstroke Lungs: clear to ausculatation and percussion; no wheezing or rales Heart: irregularly irregular with a ventricular rate at ~100 bpm, s1 s2 normal 1-2/6 systolic murmur. No diastolic murmur. No rub thrills or heaves. No S3  Abdomen: Moderate central adiposity soft, nontender; no hepatosplenomehaly, BS+; abdominal aorta nontender and not dilated by palpation. Back: No CVA tenderness Pulses 2+ Extremities: no clubbing cyanosis or edema, Homan's sign negative  Neurologic: grossly nonfocal Psychologic: Normal affect and mood  ECG (independently read by me and (: Atrial fibrillation at 100 bpm.  T-wave changes inferolaterally.  QTc interval 453 ms  December 15, 2013 ECG (independently read by me): atrial fibrillation at 87 bpm.  RV conduction delay.  Inferolateral, lateral T-wave changes.  QTc interval 476 ms  October 2015 ECG (independently read by me): Atrial fibrillation at 79 beats per minute.  LVH by voltage criteria.  ST-T abnormality laterally  Prior June 2015 ECG (independently read by me): Normal sinus rhythm at 64 beats per minute.  Inferior lateral T-wave abnormality  Prior February 2015 ECG (independently read by me): Sinus rhythm with inferolateral ST-T changes, unchanged  Prior ECG of 01/03/2013: Sinus rhythm at 82 beats per minute with previously noted the inferolateral T-wave  abnormalities; however these T changes appear slightly more prominent than his last ECG in are diffuse. Probable LVH with repolarization changes; incomplete right bundle branch block  LABS:  BMET    Component Value Date/Time   NA 141 12/09/2013 0927   K 4.6 12/09/2013 0927   CL 102 12/09/2013 0927   CO2 26 12/09/2013 0927   GLUCOSE 112* 12/09/2013 0927   BUN 29* 12/09/2013 0927   CREATININE 1.46* 12/09/2013 0927   CREATININE 1.08 11/10/2013 0455   CALCIUM 9.5 12/09/2013 0927   GFRNONAA 66* 11/10/2013 0455   GFRAA 77* 11/10/2013 0455     Hepatic Function Panel     Component Value Date/Time   PROT 7.1 12/09/2013 0927   ALBUMIN 4.6 12/09/2013 0927   AST 15 12/09/2013 0927   ALT 15 12/09/2013 0927   ALKPHOS 66 12/09/2013 0927   BILITOT 0.6 12/09/2013 0927   BILIDIR <0.2 10/28/2013 1830   IBILI NOT CALCULATED 10/28/2013 1830     CBC    Component Value Date/Time   WBC 8.2 12/09/2013 0927   WBC 6.6 01/28/2013 0943   RBC 3.99* 12/09/2013 0927   RBC 5.05 01/28/2013 0943   HGB 13.3 12/09/2013 0927   HGB 15.4 01/28/2013 0943   HCT 39.1 12/09/2013 0927   HCT 49.6 01/28/2013 0943   PLT 304 12/09/2013 0927   MCV 98.0 12/09/2013 0927   MCV 98.3* 01/28/2013 0943   MCH 33.3 12/09/2013 0927   MCH 30.5 01/28/2013 0943   MCHC 34.0 12/09/2013 0927   MCHC 31.0* 01/28/2013 0943   RDW 14.2 12/09/2013 0927   LYMPHSABS 1.6 11/08/2013 1215   MONOABS 1.5* 11/08/2013 1215   EOSABS 0.1 11/08/2013 1215   BASOSABS 0.1 11/08/2013 1215     BNP  Component Value Date/Time   PROBNP 1341.0* 11/08/2013 1215    Lipid Panel     Component Value Date/Time   CHOL 225* 12/09/2013 0927   TRIG 158* 12/09/2013 0927   HDL 52 12/09/2013 0927   CHOLHDL 4.3 12/09/2013 0927   VLDL 32 12/09/2013 0927   LDLCALC 141* 12/09/2013 0927     RADIOLOGY: Dg Chest 2 View  10/08/2012   CLINICAL DATA:  Chest pain  EXAM: CHEST  2 VIEW  COMPARISON:  02/12/2009.  FINDINGS: Mild hyperinflation of the  lungs. Prior CABG. Heart and mediastinal contours are within normal limits. No focal opacities or effusions. No acute bony abnormality.  IMPRESSION: No active cardiopulmonary disease.   Electronically Signed   By: Charlett Nose   On: 10/08/2012 14:55   Ct Head Wo Contrast  10/08/2012   *RADIOLOGY REPORT*  Clinical Data: Near-syncope  CT HEAD WITHOUT CONTRAST  Technique:  Contiguous axial images were obtained from the base of the skull through the vertex without contrast.  Comparison: 05/06/2007  Findings: No evidence of parenchymal hemorrhage or extra-axial fluid collection. No mass lesion, mass effect, or midline shift.  No CT evidence of acute infarction.  Subcortical white matter and periventricular small vessel ischemic changes.  Intracranial atherosclerosis.  Mild global cortical atrophy, likely age appropriate.  No ventriculomegaly.  The visualized paranasal sinuses are essentially clear. The mastoid air cells are unopacified.  No evidence of calvarial fracture.  IMPRESSION: No evidence of acute intracranial abnormality.  Mild atrophy with small vessel ischemic changes and intracranial atherosclerosis.   Original Report Authenticated By: Charline Bills, M.D.      ASSESSMENT AND PLAN:  [name] is a 73 year old white male who is status post CABG surgery in 1997 and has undergone several interventions in 2006, 2011 and most recently underwent for lesion intervention to the vein graft supplying his RCA. He denies recurrent anginal symptoms following intervention with additional stenting to the SVG as well as to his distal native PLA.  He has developed recurrent atrial fibrillation which has been documented since mid September 2015.  He was started on warfarin anticoagulation.  He now has been on amiodarone for at least 6 weeks.  When last seen I attempted to further titrate this to 200 mg twice a day, but he felt that this created more weakness and consequently he is back on just 200 mg.daily  His  ventricular rate is not well controlled.  I'm adding digoxin 0.125 mg to his medical regimen.  His INR today was 3.4, which is therapeutic.  We will now try to expedite his attempt at cardioversion and will schedule him for this to be done next week.  I discussed risk, benefits of the procedure.  He also has not yet had his sleep study and he needs to be restarted on CPAP therapy.  We will see if there is anyway his sleep study can be moved up from the current date of 02/15/2014. His lipid studies remain elevated.  Since he is on amiodarone.  He will not have any further titration of his Vytorin.  This may ultimately need to be changed to a different agent for more aggressive LDL lowering with a target LDL less than 70.  I will see him in the office several weeks following his cardioversion and further recommendations will be made at that time.   Time spent: 25 minutes  Lennette Bihari, MD, [facility]  12/23/2013 2:57 PM

## 2013-12-29 NOTE — Telephone Encounter (Signed)
Spoke with patient's wife informing her per Dr. Tresa Endo to D/C K+. She voiced understanding and will advise the patient.  Patient had DCCV done earlier this morning.

## 2013-12-29 NOTE — Telephone Encounter (Signed)
-----   Message from Lennette Bihari, MD sent at 12/28/2013  9:06 PM EST ----- DC supplemental KCL

## 2013-12-29 NOTE — Procedures (Signed)
Electrical Cardioversion Procedure Note KATHRYN LINAREZ [id number] 1941-02-12  Procedure: Electrical Cardioversion Indications:  Atrial Fibrillation  Procedure Details Consent: Risks of procedure as well as the alternatives and risks of each were explained to the (patient/caregiver).  Consent for procedure obtained. Time Out: Verified patient identification, verified procedure, site/side was marked, verified correct patient position, special equipment/implants available, medications/allergies/relevent history reviewed, required imaging and test results available.  Performed  Patient placed on cardiac monitor, pulse oximetry, supplemental oxygen as necessary.  Sedation given: Propofol Pacer pads placed anterior and posterior chest.  Cardioverted 1 time(s).  Cardioverted at 200J.  Evaluation Findings: Post procedure EKG shows: NSR Complications: None Patient did tolerate procedure well.   Marca Ancona 12/29/2013, 10:32 AM

## 2013-12-29 NOTE — Interval H&P Note (Signed)
History and Physical Interval Note:  12/29/2013 10:30 AM  [name]  has presented today for surgery, with the diagnosis of a fib   The various methods of treatment have been discussed with the patient and family. After consideration of risks, benefits and other options for treatment, the patient has consented to  Procedure(s): CARDIOVERSION (N/A) as a surgical intervention .  The patient's history has been reviewed, patient examined, no change in status, stable for surgery.  I have reviewed the patient's chart and labs.  Questions were answered to the patient's satisfaction.     [name] [employer]

## 2013-12-29 NOTE — Transfer of Care (Signed)
Immediate Anesthesia Transfer of Care Note  Patient: [name]  Procedure(s) Performed: Procedure(s): CARDIOVERSION (N/A)  Patient Location: PACU and Endoscopy Unit  Anesthesia Type:General  Level of Consciousness: awake, alert  and oriented  Airway & Oxygen Therapy: Patient Spontanous Breathing and Patient connected to nasal cannula oxygen  Post-op Assessment: Report given to PACU RN and Post -op Vital signs reviewed and stable  Post vital signs: Reviewed and stable  Complications: No apparent anesthesia complications

## 2013-12-29 NOTE — Anesthesia Postprocedure Evaluation (Signed)
  Anesthesia Post-op Note  Patient: [name]  Procedure(s) Performed: Procedure(s): CARDIOVERSION (N/A)  Patient Location: PACU and Endoscopy Unit  Anesthesia Type:General  Level of Consciousness: awake, alert  and oriented  Airway and Oxygen Therapy: Patient Spontanous Breathing and Patient connected to nasal cannula oxygen  Post-op Pain: none  Post-op Assessment: Post-op Vital signs reviewed and Patient's Cardiovascular Status Stable  Post-op Vital Signs: Reviewed and stable  Last Vitals:  Filed Vitals:   12/29/13 1041  BP: 149/78  Pulse: 76  Temp:   Resp: 18    Complications: No apparent anesthesia complications

## 2013-12-29 NOTE — Telephone Encounter (Signed)
Rx sent to pharmacy   

## 2013-12-29 NOTE — Discharge Instructions (Addendum)
Electrical Cardioversion, Care After Refer to this sheet in the next few weeks. These instructions provide you with information on caring for yourself after your procedure. Your health care provider may also give you more specific instructions. Your treatment has been planned according to current medical practices, but problems sometimes occur. Call your health care provider if you have any problems or questions after your procedure. WHAT TO EXPECT AFTER THE PROCEDURE After your procedure, it is typical to have the following sensations:  Some redness on the skin where the shocks were delivered. If this is tender, a sunburn lotion or hydrocortisone cream may help.  Possible return of an abnormal heart rhythm within hours or days after the procedure. HOME CARE INSTRUCTIONS  Take medicines only as directed by your health care provider. Be sure you understand how and when to take your medicine.  Learn how to feel your pulse and check it often.  Limit your activity for 48 hours after the procedure or as directed by your health care provider.  Avoid or minimize caffeine and other stimulants as directed by your health care provider. SEEK MEDICAL CARE IF:  You feel like your heart is beating too fast or your pulse is not regular.  You have any questions about your medicines.  You have bleeding that will not stop. SEEK IMMEDIATE MEDICAL CARE IF:  You are dizzy or feel faint.  It is hard to breathe or you feel short of breath.  There is a change in discomfort in your chest.  Your speech is slurred or you have trouble moving an arm or leg on one side of your body.  You get a serious muscle cramp that does not go away.  Your fingers or toes turn cold or blue. Document Released: 11/20/2012 Document Revised: 06/16/2013 Document Reviewed: 11/20/2012 [facility] Patient Information 2015 [place], [place]. This information is not intended to replace advice given to you by your health care provider.  Make sure you discuss any questions you have with your health care provider.   Per Dr. Shirlee Latch advised patient/care partner to continue taking Amiodorone 200 mg once daily.

## 2013-12-31 ENCOUNTER — Encounter (HOSPITAL_COMMUNITY): Payer: Self-pay | Admitting: Cardiology

## 2014-01-02 ENCOUNTER — Ambulatory Visit (HOSPITAL_BASED_OUTPATIENT_CLINIC_OR_DEPARTMENT_OTHER): Payer: Medicare Other | Attending: Cardiovascular Disease

## 2014-01-02 VITALS — Ht 71.0 in | Wt 200.0 lb

## 2014-01-02 DIAGNOSIS — I4891 Unspecified atrial fibrillation: Secondary | ICD-10-CM | POA: Diagnosis present

## 2014-01-02 DIAGNOSIS — G4733 Obstructive sleep apnea (adult) (pediatric): Secondary | ICD-10-CM

## 2014-01-02 DIAGNOSIS — G4730 Sleep apnea, unspecified: Secondary | ICD-10-CM | POA: Insufficient documentation

## 2014-01-02 DIAGNOSIS — R0683 Snoring: Secondary | ICD-10-CM | POA: Diagnosis not present

## 2014-01-11 NOTE — Sleep Study (Signed)
NAME: [name] DATE OF BIRTH:  1940-06-04 MEDICAL RECORD NUMBER [id number]  LOCATION: [facility]  PHYSICIAN: Everton Bertha A  DATE OF STUDY: 01/02/2014  SLEEP STUDY TYPE: Split Night Polysomnogram/CPAP/BiPAP               REFERRING PHYSICIAN: Lennette Bihari, MD  INDICATION FOR STUDY: Mr. Baran is a 73 year old white male with a history of significant snoring, witnessed apnea, and atrial fibrillation.  He is referred for a polysomnogram/split-night protocol.  EPWORTH SLEEPINESS SCORE:  5  HEIGHT: 5\' 11"  (180.3 cm)  WEIGHT: 200 lb (90.719 kg)    Body mass index is 27.91 kg/(m^2).  NECK SIZE: 18 in.  MEDICATIONS: warfarin (COUMADIN) 5 MG tablet 5 mg, Daily predniSONE (DELTASONE) 1 MG tablet 4 mg, Daily with breakfast ONE TOUCH ULTRA TEST test strip  Note: Received from: External Pharmacy (Written 12/23/2013 1411)  nitroGLYCERIN (NITROSTAT) 0.4 MG SL tablet 0.4 mg, Every 5 min PRN  Note: . (Written 12/25/2013 1346)  metFORMIN (GLUCOPHAGE) 1000 MG tablet 1,000 mg, 2 times daily with meals losartan (COZAAR) 50 MG tablet 50 mg, 2 times daily glimepiride (AMARYL) 1 MG tablet 1 mg, Daily  Note: . (Written 10/28/2013 1943)  furosemide (LASIX) 20 MG tablet folic acid (FOLVITE) 1 MG tablet 1 mg, Daily with lunch ezetimibe-simvastatin (VYTORIN) 10-20 MG per tablet 1 tablet, Daily diltiazem (CARDIZEM CD) 120 MG 24 hr capsule 120 mg, 2 times daily before meals digoxin (LANOXIN) 0.125 MG tablet 0.125 mg, Daily clopidogrel (PLAVIX) 75 MG tablet 75 mg, Daily clopidogrel (PLAVIX) 75 MG tablet carvedilol (COREG) 25 MG tablet 25 mg, 2 times daily with meals amiodarone (PACERONE) 200 MG tablet   SLEEP ARCHITECTURE:  On the baseline portion of the study, sleep efficiency was 71.2% with a sleep period of 169 minutes out of a total recording time of 186 minutes.  REM sleep was not achieved.  The patient spent 44 minutes in stage I (33.2%) and 88.5 minutes in stage II (66.8%).   97 minutes were spent with supine sleep (73.2%).  During the titration phase of the study, sleep efficiency was 68.4% with 195 minutes of sleep time out of 212 minutes.  28 minutes (19.3% was in stage I, 93.5 minutes (64.5%) in stage II, and 23.5 minutes (16.2%) was in rem sleep.  The patient spent 23.5 minutes with supine REM sleep (16.2%).  RESPIRATORY DATA:  Severe obstructive sleep apnea was demonstrated with an AHI of 78.8 on the diagnostic portion of the study.  There was evidence for loud snoring.  CPAP was started at initial 4 cm water pressure and was titrated up to 17 cm.  The AHI at 17 cm was still markedly increased at 57.1 per hour.  BiPAP was initiated at 17/14 and was titrated up to 18/14 cm of  water.  Sleep duration at 18/14 was only 3.5 minutes and REM sleep was not observed at this pressure.  AHI was 17.1/hr.  OXYGEN DATA:   The oxygen nadir on the baseline portion of the study was 86% with non-REM sleep and was 85% at 4 cm pressure.  At 18/14 cm water pressure, the oxygen nadir was 93%.  CARDIAC DATA: Mean heart rate was 70.4 on the baseline portion of the study and 67.4 on the titration study during sleep.  MOVEMENT/PARASOMNIA: On the baseline portion of the study there were 5 periodic limb movements with an index of 2.3, of which 1 led to arousal.  No periodic limb movements were  observed during the titration portion of the study.  IMPRESSION/ RECOMMENDATION:  Severe obstructive sleep apnea/hypoxia syndrome. Absence of REM sleep on the baseline portion of the study Oxygen desaturation to 86% on the baseline portion of the study. Abnormal sleep architecture related to respiratory events. Loud snoring  Recommend initial use of BiPAP auto with Bi-Flex of 3 an EPAP minimum of 12, pressure support of 4, and IPAP maximum of 25 with a heated humidifier.  Recommend download in 30 days in sleep clinic evaluation.  Lennette Bihari Diplomate, American Board of Sleep  Medicine  ELECTRONICALLY SIGNED ON:  01/11/2014, 1:17 PM [facility] PH: [phone]   FX: [phone] ACCREDITED BY THE AMERICAN ACADEMY OF SLEEP MEDICINE

## 2014-01-12 ENCOUNTER — Ambulatory Visit (INDEPENDENT_AMBULATORY_CARE_PROVIDER_SITE_OTHER): Payer: Medicare Other | Admitting: Pharmacist Clinician (PhC)/ Clinical Pharmacy Specialist

## 2014-01-12 DIAGNOSIS — Z7901 Long term (current) use of anticoagulants: Secondary | ICD-10-CM

## 2014-01-12 DIAGNOSIS — I4891 Unspecified atrial fibrillation: Secondary | ICD-10-CM

## 2014-01-12 DIAGNOSIS — I480 Paroxysmal atrial fibrillation: Secondary | ICD-10-CM

## 2014-01-12 LAB — POCT INR: INR: 5.1

## 2014-01-16 ENCOUNTER — Telehealth: Payer: Self-pay | Admitting: *Deleted

## 2014-01-16 NOTE — Telephone Encounter (Signed)
Opened in error

## 2014-01-20 ENCOUNTER — Ambulatory Visit (INDEPENDENT_AMBULATORY_CARE_PROVIDER_SITE_OTHER): Payer: Medicare Other | Admitting: *Deleted

## 2014-01-20 ENCOUNTER — Ambulatory Visit (INDEPENDENT_AMBULATORY_CARE_PROVIDER_SITE_OTHER): Payer: Medicare Other | Admitting: Cardiovascular Disease

## 2014-01-20 ENCOUNTER — Encounter: Payer: Self-pay | Admitting: Cardiovascular Disease

## 2014-01-20 VITALS — BP 140/70 | HR 57 | Ht 71.0 in | Wt 203.1 lb

## 2014-01-20 DIAGNOSIS — I2510 Atherosclerotic heart disease of native coronary artery without angina pectoris: Secondary | ICD-10-CM

## 2014-01-20 DIAGNOSIS — Z7901 Long term (current) use of anticoagulants: Secondary | ICD-10-CM

## 2014-01-20 DIAGNOSIS — G4733 Obstructive sleep apnea (adult) (pediatric): Secondary | ICD-10-CM

## 2014-01-20 DIAGNOSIS — I110 Hypertensive heart disease with heart failure: Secondary | ICD-10-CM

## 2014-01-20 DIAGNOSIS — I4891 Unspecified atrial fibrillation: Secondary | ICD-10-CM

## 2014-01-20 DIAGNOSIS — I509 Heart failure, unspecified: Secondary | ICD-10-CM

## 2014-01-20 DIAGNOSIS — I480 Paroxysmal atrial fibrillation: Secondary | ICD-10-CM

## 2014-01-20 DIAGNOSIS — I2583 Coronary atherosclerosis due to lipid rich plaque: Secondary | ICD-10-CM

## 2014-01-20 LAB — POCT INR: INR: 1.9

## 2014-01-20 NOTE — Patient Instructions (Signed)
Your physician recommends that you schedule a follow-up appointment in: 2 MONTHS WITH DR Tresa Endo

## 2014-01-21 DIAGNOSIS — G4733 Obstructive sleep apnea (adult) (pediatric): Secondary | ICD-10-CM | POA: Insufficient documentation

## 2014-01-21 DIAGNOSIS — I480 Paroxysmal atrial fibrillation: Secondary | ICD-10-CM | POA: Insufficient documentation

## 2014-01-21 NOTE — Progress Notes (Signed)
Patient ID: [name], male   DOB: Feb 10, 1941, 73 y.o.   MRN: [id number]     HPI: [name] is a 73 y.o. male who presents to the office for cardiology evaluation in follow-up of his recent cardioversion and sleep study.  [name]  has established CAD dating back to 1997 as is s/p CABGx5 revascularization surgery. In 2006 he underwent stenting of the midportion of the graft to the right coronary artery and cutting balloon to the LAD after the LIMA insertion. On New Year's Eve 2011 he developed an acute coronary syndrome was found to have a new subtotal stenosis in the graft to right coronary artery more distal to the prior stent and also had in-stent narrowing to the proximal portion of the previously placed stent. He underwent successful intervention with a 3.5x18 mm stent. His LIMA to the LAD was patent. The graft to the circumflex was occluded. He had 90% stenosis in a small marginal vessel. He does have a history of hypertension, type 2 diabetes mellitus, as well as hyperlipidemia. His last nuclear perfusion study was in February 2014 which continued to show normal perfusion without scar or ischemia. An echo Doppler study at that time showed an ejection fraction of 55-60% with mild aortic sclerosis without stenosis. He had mild pulmonary hypertension with a PA pressure 32 mm.  In August 2014, he developed a presyncopal spell after he'd been dragging and unloading logs from a stroller. Head CT did not show acute abnormalities although did suggest small vessel disease. Chest x-ray was normal. Cardiac enzymes were negative. Hemoglobin was 13.6 hematocrit 37.3. Renal function revealed a BUN of 25 currently 1.13. I saw him in the office several days later and felt that his symptoms occurred in the setting of mild dehydration with significant straining possibly inducing a vagal event while he was dragging heavy logs.   He has a history of Barrett's esophagus. Recently, he has developed  episodes of chest pain typically after he eats. This has been associated with a sensation of increased gas. He feels symptoms radiating from his stomach into his esophagus. Recently, he started taking over-the-counter probiotics and some of his abdominal bloating and gas has significantly improved. He denies recent chest pressure that is similar to his prior angina. He denies any change in shortness of breath development. He recently saw the PA for Dr. Arlyce Dice [facility]. Because of this chest pain history it was recommended that he see me prior to planned endoscopy when I saw him in November 2014.  He did undergo his procedure. He tolerated it well. He also tells me he is being cared for by the by Dr. Dierdre Forth for his arthritis. He denies any exertional chest pain. He denies shortness of breath. He is unaware of palpitations. He does have diabetes. He has been taking his Bentyl prednisone for his Barrett's esophagus and GERD.  In the spring of 2015, [name] had noticed recurrent episodes of chest pain.  He underwent cardiac catheterization on 07/30/2013 which showed significant native CAD with proximal 80% LAD stenosis and total mid LAD occlusion, 90% circumflex stenosis and total proximal occlusion of the native RCA.  He had a patent LIMA graft to the LAD.  The graft, which had supplied the circumflex marginal vessel and had previously been demonstrated to be occluded remained occluded.  He now had progressive disease in the vein graft supplying the right coronary artery and 4 lesion intervention was performed to the  saphenous vein graft  proximally, mid, distally with DES stenting and he also underwent PTCA to the PLA branch of the distal native RCA.  He was admitted to [facility] on 10/28/2013 with atrial fibrillation with a rapid ventricular response.  Cardiac enzymes were negative.  An echo Doppler study showed an EF of 55% with basal inferior severe hypokinesis.  There were no significant valvular  abnormalities.  He was started on warfarin therapy.  He was readmitted on November 08 2013  with acute on chronic diastolic CHF.  It was felt that his atrial fibrillation was causing the CHF.   Over the last several months, he had been started on amiodarone.  He did not tolerate dose titration up to 4 mg daily.  In had been on 200 mg.  With atrial fibrillation.  He had developed significant shortness of breath with activity.  He has been on Coumadin anticoagulation.  He ultimately underwent DC cardioversion on 12/29/2013 with restoration of sinus rhythm.  He has obstructive sleep apnea but has not been utilizing CPAP therapy for over 10-15 years. He states she typically goes to bed between 9 and 9:30 PM and wakes up at 8 AM.  He does significantly snore and wakes up at least 3 times per night.  He recently underwent his sleep study which revealed severe sleep apnea with an HIF 78.8.  On the diagnostic portion of the split-night protocol.  He had loud snoring.  CPAP was instituted but ultimately he required BiPAP therapy.  Even at a Bi-PAP pressure of 18/14 AHI was still elevated at 17.1 and initial use of BiPAP auto with a bi-Flex of 3 an EPAP minimum of 12, pressure support of 4, and IPAP maximum of 25 with a heated humidifier was recommended.  He's not yet received his equipment.  [facility] had been contacted since this is the [employer] company that his wife has used. He presents for evaluation.  He denies chest pain since his multi-vessel multi lesion intervention.  His shortness of breath has significantly improved following his cardioversion with restoration of sinus rhythm.   Past Medical History  Diagnosis Date  . Coronary artery disease     a. h/o CABG 1997. b. stenting to graft-RCA, CB to LAD after LIMA in 2006. c. stenting to VG-RCA beyond initial stent and in-stent restenosis in 2011. c. Complex PCI 07/2013 to SVG-RCA and distal native RCA involving 4 sites (see cath report).  .  Hypertension   . Hyperlipidemia   . Diabetes   . Chronic kidney disease   . Myocardial infarction   . GERD (gastroesophageal reflux disease)   . Arthritis   . Sleep apnea     hx of   . Barrett's esophagus   . Pre-syncope     a. 09/2012: in the setting of mild dehydration with significant straining possibly inducing a vagal event while he was dragging heavy logs.     Past Surgical History  Procedure Laterality Date  . Coronary angioplasty with stent placement    . Coronary artery bypass graft    . Joint replacement    . Coronary stent placement  07/30/2013    DES to RCA     DR Terah Robey  . Hernia repair    . Shoulder surgery    . Kidney stone surgery    . Cardioversion N/A 12/29/2013    Procedure: CARDIOVERSION;  Surgeon: Laurey Morale, MD;  Location[facility] ENDOSCOPY;  Service: Cardiovascular;  Laterality: N/A;    Allergies  Allergen  Reactions  . Ivp Dye [Iodinated Diagnostic Agents] Other (See Comments)    Increased blood pressure, heart rate    Current Outpatient Prescriptions  Medication Sig Dispense Refill  . amiodarone (PACERONE) 200 MG tablet Take 200 mg by mouth daily.    . carvedilol (COREG) 25 MG tablet Take 25 mg by mouth 2 (two) times daily with a meal.    . clopidogrel (PLAVIX) 75 MG tablet TAKE 1 TABLET EVERY DAY 30 tablet 11  . digoxin (LANOXIN) 0.125 MG tablet Take 1 tablet (0.125 mg total) by mouth daily. 30 tablet 6  . diltiazem (CARDIZEM CD) 120 MG 24 hr capsule Take 1 capsule (120 mg total) by mouth 2 (two) times daily before a meal. 60 capsule 5  . ezetimibe-simvastatin (VYTORIN) 10-20 MG per tablet Take 1 tablet by mouth daily.    . folic acid (FOLVITE) 1 MG tablet Take 1 mg by mouth daily with lunch.     . furosemide (LASIX) 20 MG tablet Take 1 tablet as needed for swelling 30 tablet 11  . glimepiride (AMARYL) 1 MG tablet Take 1 mg by mouth daily.     Marland Kitchen losartan (COZAAR) 50 MG tablet Take 1 tablet (50 mg total) by mouth 2 (two) times daily. 60 tablet 9  .  metFORMIN (GLUCOPHAGE) 1000 MG tablet Take 1,000 mg by mouth 2 (two) times daily with a meal.    . nitroGLYCERIN (NITROSTAT) 0.4 MG SL tablet Place 0.4 mg under the tongue every 5 (five) minutes as needed for chest pain.    . ONE TOUCH ULTRA TEST test strip   2  . predniSONE (DELTASONE) 1 MG tablet Take 3 mg by mouth daily with breakfast.     . warfarin (COUMADIN) 5 MG tablet Take 1 tablet (5 mg total) by mouth daily. 90 tablet 3   No current facility-administered medications for this visit.    History   Social History  . Marital Status: Married    Spouse Name: N/A    Number of Children: 1  . Years of Education: N/A   Occupational History  . Retired    Social History Main Topics  . Smoking status: Never Smoker   . Smokeless tobacco: Never Used  . Alcohol Use: Yes     Comment: 2-3 beers a month.  . Drug Use: No  . Sexual Activity: Not on file   Other Topics Concern  . Not on file   Social History Narrative   Lives with family   Social history notable that he is married he has one child. There is no tobacco use. He does not routinely exercise. He does drink alcohol infrequently.  Family History  Problem Relation Age of Onset  . Heart attack Brother   . Heart disease Brother   . Diabetes Mother   . Heart disease Mother   . Hypertension Mother   . Cancer Father   . Diabetes Sister   . Hypertension Sister   . Heart disease Brother   . Diabetes Brother   . Colon cancer Neg Hx     ROS General: Negative; No fevers, chills, or night sweats; positive for increased weakness HEENT: Negative; No changes in vision or hearing, sinus congestion, difficulty swallowing Pulmonary: Negative; No cough, wheezing, hemoptysis Cardiovascular: See history of present illness GI: Positive for history of abdominal bloating No nausea, vomiting, diarrhea, or abdominal pain GU: Negative; No dysuria, hematuria, or difficulty voiding Musculoskeletal: Negative; no myalgias, joint pain, or  weakness Hematologic/Oncology: Negative; no easy  bruising, bleeding Endocrine: Positive for diabetes; no heat/cold intolerance; Neuro: Negative; no changes in balance, headaches Skin: Negative; No rashes or skin lesions Psychiatric: Negative; No behavioral problems, depression Sleep: Positive for sleep apnea and snoring, daytime sleepiness, hypersomnolence, bruxism, restless legs, hypnogognic hallucinations, no cataplexy Other comprehensive 14 point system review is negative.  PE BP 140/70 mmHg  Pulse 57  Ht 5\' 11"  (1.803 m)  Wt 203 lb 1.6 oz (92.126 kg)  BMI 28.34 kg/m2  General: Alert, oriented, no distress.  Skin: normal turgor, no rashes HEENT: Normocephalic, atraumatic. Pupils round and reactive; sclera anicteric;no lid lag. No xanthelasmas. Nose without nasal septal hypertrophy Mouth/Parynx benign; Mallinpatti scale 3 Neck: No JVD, no carotid bruits with normal carotid upstroke Lungs: clear to ausculatation and percussion; no wheezing or rales Heart: Regular rhythm at 57 bpm, s1 s2 normal 1-2/6 systolic murmur. No diastolic murmur. No rub thrills or heaves. No S3  Abdomen: Moderate central adiposity soft, nontender; no hepatosplenomehaly, BS+; abdominal aorta nontender and not dilated by palpation. Back: No CVA tenderness Pulses 2+ Extremities: no clubbing cyanosis or edema, Homan's sign negative  Neurologic: grossly nonfocal Psychologic: Normal affect and mood  ECG (independently read by me): Sinus bradycardia 57 bpm.  Borderline first-degree block with a PR interval at 202 ms.  T-wave abnormalities inferolaterally.  12/23/2013 ECG (independently read by me): Atrial fibrillation at 100 bpm.  T-wave changes inferolaterally.  QTc interval 453 ms  December 15, 2013 ECG (independently read by me): atrial fibrillation at 87 bpm.  RV conduction delay.  Inferolateral, lateral T-wave changes.  QTc interval 476 ms  October 2015 ECG (independently read by me): Atrial fibrillation at  79 beats per minute.  LVH by voltage criteria.  ST-T abnormality laterally  Prior June 2015 ECG (independently read by me): Normal sinus rhythm at 64 beats per minute.  Inferior lateral T-wave abnormality  Prior February 2015 ECG (independently read by me): Sinus rhythm with inferolateral ST-T changes, unchanged  Prior ECG of 01/03/2013: Sinus rhythm at 82 beats per minute with previously noted the inferolateral T-wave abnormalities; however these T changes appear slightly more prominent than his last ECG in are diffuse. Probable LVH with repolarization changes; incomplete right bundle branch block  LABS:  BMET    Component Value Date/Time   NA 138 12/29/2013 0923   K 4.4 12/29/2013 0923   CL 106 12/23/2013 0837   CO2 21 12/23/2013 0837   GLUCOSE 98 12/29/2013 0923   BUN 37* 12/23/2013 0837   CREATININE 1.35 12/23/2013 0837   CREATININE 1.08 11/10/2013 0455   CALCIUM 10.1 12/23/2013 0837   GFRNONAA 66* 11/10/2013 0455   GFRAA 77* 11/10/2013 0455     Hepatic Function Panel     Component Value Date/Time   PROT 7.1 12/09/2013 0927   ALBUMIN 4.6 12/09/2013 0927   AST 15 12/09/2013 0927   ALT 15 12/09/2013 0927   ALKPHOS 66 12/09/2013 0927   BILITOT 0.6 12/09/2013 0927   BILIDIR <0.2 10/28/2013 1830   IBILI NOT CALCULATED 10/28/2013 1830     CBC    Component Value Date/Time   WBC 8.2 12/23/2013 0837   WBC 6.6 01/28/2013 0943   RBC 3.82* 12/23/2013 0837   RBC 5.05 01/28/2013 0943   HGB 12.2* 12/29/2013 0923   HGB 15.4 01/28/2013 0943   HCT 36.0* 12/29/2013 0923   HCT 49.6 01/28/2013 0943   PLT 311 12/23/2013 0837   MCV 93.7 12/23/2013 0837   MCV 98.3* 01/28/2013 0943   MCH 31.9  12/23/2013 0837   MCH 30.5 01/28/2013 0943   MCHC 34.1 12/23/2013 0837   MCHC 31.0* 01/28/2013 0943   RDW 14.4 12/23/2013 0837   LYMPHSABS 1.6 11/08/2013 1215   MONOABS 1.5* 11/08/2013 1215   EOSABS 0.1 11/08/2013 1215   BASOSABS 0.1 11/08/2013 1215     BNP    Component Value  Date/Time   PROBNP 1341.0* 11/08/2013 1215    Lipid Panel     Component Value Date/Time   CHOL 225* 12/09/2013 0927   TRIG 158* 12/09/2013 0927   HDL 52 12/09/2013 0927   CHOLHDL 4.3 12/09/2013 0927   VLDL 32 12/09/2013 0927   LDLCALC 141* 12/09/2013 0927     RADIOLOGY: Dg Chest 2 View  10/08/2012   CLINICAL DATA:  Chest pain  EXAM: CHEST  2 VIEW  COMPARISON:  02/12/2009.  FINDINGS: Mild hyperinflation of the lungs. Prior CABG. Heart and mediastinal contours are within normal limits. No focal opacities or effusions. No acute bony abnormality.  IMPRESSION: No active cardiopulmonary disease.   Electronically Signed   By: Charlett Nose   On: 10/08/2012 14:55   Ct Head Wo Contrast  10/08/2012   *RADIOLOGY REPORT*  Clinical Data: Near-syncope  CT HEAD WITHOUT CONTRAST  Technique:  Contiguous axial images were obtained from the base of the skull through the vertex without contrast.  Comparison: 05/06/2007  Findings: No evidence of parenchymal hemorrhage or extra-axial fluid collection. No mass lesion, mass effect, or midline shift.  No CT evidence of acute infarction.  Subcortical white matter and periventricular small vessel ischemic changes.  Intracranial atherosclerosis.  Mild global cortical atrophy, likely age appropriate.  No ventriculomegaly.  The visualized paranasal sinuses are essentially clear. The mastoid air cells are unopacified.  No evidence of calvarial fracture.  IMPRESSION: No evidence of acute intracranial abnormality.  Mild atrophy with small vessel ischemic changes and intracranial atherosclerosis.   Original Report Authenticated By: Charline Bills, M.D.      ASSESSMENT AND PLAN:  [name] is a 73 year old white male who is status post CABG surgery in 1997 and has undergone several interventions in 2006, 2011 and most recently underwent 4 lesion intervention to the vein graft supplying his RCA. He denies recurrent anginal symptoms following intervention with additional  stenting to the SVG as well as to his distal native PLA.  He had developed recurrent atrial fibrillation which has been documented since mid September 2015.  He was started on warfarin anticoagulation.  With atrial fibrillation he developed significant exertional shortness of breath.  He also only underwent successful cardioversion on 12/29/2013.  His ECG confirms that he is maintaining sinus rhythm.  His shortness of breath has resolved.  He's not having anginal symptoms.  His INR today is 1.9.  His sleep study confirms severe obstructive sleep apnea and I reviewed the specifics of his study in detail with both he and his wife.  He will be initiating BiPAP therapy and has a high pressure requirement.  [facility] was contacted.  Hopefully he will obtain his equipment this week and initiate therapy as soon as possible.  I will then need to see him in several months in a sleep clinic for follow-up evaluation per Medicare requirements.   Time spent: 30 minutes  Lennette Bihari, MD, [facility]  01/21/2014 4:36 PM

## 2014-01-22 ENCOUNTER — Encounter (HOSPITAL_COMMUNITY): Payer: Self-pay | Admitting: Cardiovascular Disease

## 2014-02-23 ENCOUNTER — Ambulatory Visit (INDEPENDENT_AMBULATORY_CARE_PROVIDER_SITE_OTHER): Payer: PPO | Admitting: Pharmacist Clinician (PhC)/ Clinical Pharmacy Specialist

## 2014-02-23 DIAGNOSIS — I4891 Unspecified atrial fibrillation: Secondary | ICD-10-CM

## 2014-02-23 DIAGNOSIS — I480 Paroxysmal atrial fibrillation: Secondary | ICD-10-CM

## 2014-02-23 DIAGNOSIS — Z7901 Long term (current) use of anticoagulants: Secondary | ICD-10-CM

## 2014-02-23 LAB — POCT INR: INR: 1.4

## 2014-03-02 ENCOUNTER — Encounter: Payer: Self-pay | Admitting: Cardiovascular Disease

## 2014-03-02 ENCOUNTER — Ambulatory Visit: Payer: Self-pay | Admitting: Pharmacist Clinician (PhC)/ Clinical Pharmacy Specialist

## 2014-03-02 DIAGNOSIS — I480 Paroxysmal atrial fibrillation: Secondary | ICD-10-CM

## 2014-03-02 DIAGNOSIS — Z7901 Long term (current) use of anticoagulants: Secondary | ICD-10-CM

## 2014-03-05 ENCOUNTER — Encounter (HOSPITAL_BASED_OUTPATIENT_CLINIC_OR_DEPARTMENT_OTHER): Payer: Medicare Other

## 2014-03-09 ENCOUNTER — Ambulatory Visit: Payer: PPO | Admitting: Pharmacist Clinician (PhC)/ Clinical Pharmacy Specialist

## 2014-03-31 ENCOUNTER — Other Ambulatory Visit: Payer: Self-pay | Admitting: *Deleted

## 2014-03-31 MED ORDER — LOSARTAN POTASSIUM 100 MG PO TABS: ORAL_TABLET | ORAL | Status: DC

## 2014-04-02 ENCOUNTER — Telehealth: Payer: Self-pay | Admitting: *Deleted

## 2014-04-02 NOTE — Telephone Encounter (Signed)
Returned BIPAP machine order to [facility].

## 2014-04-03 ENCOUNTER — Ambulatory Visit: Payer: Medicare Other | Admitting: Cardiovascular Disease

## 2014-04-17 ENCOUNTER — Telehealth: Payer: Self-pay | Admitting: *Deleted

## 2014-04-17 NOTE — Telephone Encounter (Signed)
Faxed verbal order confirmation for BIPAP unit.

## 2014-04-21 ENCOUNTER — Telehealth: Payer: Self-pay | Admitting: *Deleted

## 2014-04-21 NOTE — Telephone Encounter (Signed)
Faxed order for BIPAP machine to [facility].

## 2014-05-05 ENCOUNTER — Encounter: Payer: Self-pay | Admitting: Cardiovascular Disease

## 2014-05-13 ENCOUNTER — Telehealth: Payer: Self-pay | Admitting: *Deleted

## 2014-05-13 NOTE — Telephone Encounter (Signed)
-----   Message from Lennette Bihari, MD sent at 05/09/2014 11:11 AM EDT ----- Needs improved compliance of > 4 hrs; AHI is good

## 2014-05-13 NOTE — Telephone Encounter (Signed)
LMTCB

## 2014-05-29 ENCOUNTER — Telehealth: Payer: Self-pay | Admitting: *Deleted

## 2014-05-29 NOTE — Telephone Encounter (Signed)
Faxed CPAP supporting documentation letter back to [facility] requesting FTF note.

## 2014-09-17 ENCOUNTER — Telehealth: Payer: Self-pay | Admitting: *Deleted

## 2014-09-17 NOTE — Telephone Encounter (Signed)
Returned signed order for CPAP supplies prescribed 08/20/14 to [facility].

## 2014-10-15 ENCOUNTER — Encounter: Payer: Self-pay | Admitting: Gastroenterology

## 2014-12-10 ENCOUNTER — Other Ambulatory Visit: Payer: Self-pay | Admitting: Gastroenterology

## 2015-02-19 DIAGNOSIS — L723 Sebaceous cyst: Secondary | ICD-10-CM | POA: Diagnosis not present

## 2015-02-23 DIAGNOSIS — Z7901 Long term (current) use of anticoagulants: Secondary | ICD-10-CM | POA: Diagnosis not present

## 2015-03-04 DIAGNOSIS — G4733 Obstructive sleep apnea (adult) (pediatric): Secondary | ICD-10-CM | POA: Diagnosis not present

## 2015-03-16 DIAGNOSIS — H60333 Swimmer's ear, bilateral: Secondary | ICD-10-CM | POA: Diagnosis not present

## 2015-03-16 DIAGNOSIS — H6123 Impacted cerumen, bilateral: Secondary | ICD-10-CM | POA: Diagnosis not present

## 2015-03-23 DIAGNOSIS — Z7901 Long term (current) use of anticoagulants: Secondary | ICD-10-CM | POA: Diagnosis not present

## 2015-04-06 DIAGNOSIS — Z7901 Long term (current) use of anticoagulants: Secondary | ICD-10-CM | POA: Diagnosis not present

## 2015-04-20 DIAGNOSIS — Z7901 Long term (current) use of anticoagulants: Secondary | ICD-10-CM | POA: Diagnosis not present

## 2015-05-06 ENCOUNTER — Encounter: Payer: Self-pay | Admitting: Gastroenterology

## 2015-05-20 DIAGNOSIS — Z7901 Long term (current) use of anticoagulants: Secondary | ICD-10-CM | POA: Diagnosis not present

## 2015-06-17 DIAGNOSIS — Z7901 Long term (current) use of anticoagulants: Secondary | ICD-10-CM | POA: Diagnosis not present

## 2015-06-24 DIAGNOSIS — Z7901 Long term (current) use of anticoagulants: Secondary | ICD-10-CM | POA: Diagnosis not present

## 2015-07-02 DIAGNOSIS — I2510 Atherosclerotic heart disease of native coronary artery without angina pectoris: Secondary | ICD-10-CM | POA: Diagnosis not present

## 2015-07-02 DIAGNOSIS — E1122 Type 2 diabetes mellitus with diabetic chronic kidney disease: Secondary | ICD-10-CM | POA: Diagnosis not present

## 2015-07-02 DIAGNOSIS — E7800 Pure hypercholesterolemia, unspecified: Secondary | ICD-10-CM | POA: Diagnosis not present

## 2015-07-02 DIAGNOSIS — N182 Chronic kidney disease, stage 2 (mild): Secondary | ICD-10-CM | POA: Diagnosis not present

## 2015-07-02 DIAGNOSIS — I10 Essential (primary) hypertension: Secondary | ICD-10-CM | POA: Diagnosis not present

## 2015-07-02 DIAGNOSIS — M1990 Unspecified osteoarthritis, unspecified site: Secondary | ICD-10-CM | POA: Diagnosis not present

## 2015-07-02 DIAGNOSIS — Z7984 Long term (current) use of oral hypoglycemic drugs: Secondary | ICD-10-CM | POA: Diagnosis not present

## 2015-07-02 DIAGNOSIS — M069 Rheumatoid arthritis, unspecified: Secondary | ICD-10-CM | POA: Diagnosis not present

## 2015-07-02 DIAGNOSIS — I480 Paroxysmal atrial fibrillation: Secondary | ICD-10-CM | POA: Diagnosis not present

## 2015-07-22 DIAGNOSIS — Z7901 Long term (current) use of anticoagulants: Secondary | ICD-10-CM | POA: Diagnosis not present

## 2015-08-19 DIAGNOSIS — Z7901 Long term (current) use of anticoagulants: Secondary | ICD-10-CM | POA: Diagnosis not present

## 2015-08-27 DIAGNOSIS — I2510 Atherosclerotic heart disease of native coronary artery without angina pectoris: Secondary | ICD-10-CM | POA: Diagnosis not present

## 2015-08-27 DIAGNOSIS — I480 Paroxysmal atrial fibrillation: Secondary | ICD-10-CM | POA: Diagnosis not present

## 2015-08-27 DIAGNOSIS — Z951 Presence of aortocoronary bypass graft: Secondary | ICD-10-CM | POA: Diagnosis not present

## 2015-08-27 DIAGNOSIS — Z7901 Long term (current) use of anticoagulants: Secondary | ICD-10-CM | POA: Diagnosis not present

## 2015-09-16 DIAGNOSIS — Z7901 Long term (current) use of anticoagulants: Secondary | ICD-10-CM | POA: Diagnosis not present

## 2015-10-19 DIAGNOSIS — Z7901 Long term (current) use of anticoagulants: Secondary | ICD-10-CM | POA: Diagnosis not present

## 2015-11-02 DIAGNOSIS — Z7901 Long term (current) use of anticoagulants: Secondary | ICD-10-CM | POA: Diagnosis not present

## 2015-11-04 ENCOUNTER — Other Ambulatory Visit: Payer: Self-pay | Admitting: Nurse Practitioner

## 2015-11-04 DIAGNOSIS — R1011 Right upper quadrant pain: Secondary | ICD-10-CM | POA: Diagnosis not present

## 2015-11-04 DIAGNOSIS — N182 Chronic kidney disease, stage 2 (mild): Secondary | ICD-10-CM | POA: Diagnosis not present

## 2015-11-04 DIAGNOSIS — K591 Functional diarrhea: Secondary | ICD-10-CM

## 2015-11-05 ENCOUNTER — Ambulatory Visit
Admission: RE | Admit: 2015-11-05 | Discharge: 2015-11-05 | Disposition: A | Discharge status: Home / Self Care | Payer: PPO | Source: Ambulatory Visit | Attending: Nurse Practitioner | Admitting: Nurse Practitioner

## 2015-11-05 DIAGNOSIS — K591 Functional diarrhea: Secondary | ICD-10-CM

## 2015-11-05 DIAGNOSIS — R197 Diarrhea, unspecified: Secondary | ICD-10-CM | POA: Diagnosis not present

## 2015-11-09 DIAGNOSIS — M25512 Pain in left shoulder: Secondary | ICD-10-CM | POA: Diagnosis not present

## 2015-11-09 DIAGNOSIS — S4982XA Other specified injuries of left shoulder and upper arm, initial encounter: Secondary | ICD-10-CM | POA: Diagnosis not present

## 2015-11-09 DIAGNOSIS — Z96612 Presence of left artificial shoulder joint: Secondary | ICD-10-CM | POA: Diagnosis not present

## 2015-11-09 DIAGNOSIS — S46812A Strain of other muscles, fascia and tendons at shoulder and upper arm level, left arm, initial encounter: Secondary | ICD-10-CM | POA: Diagnosis not present

## 2015-11-09 DIAGNOSIS — Z96611 Presence of right artificial shoulder joint: Secondary | ICD-10-CM | POA: Diagnosis not present

## 2015-11-18 DIAGNOSIS — Z0000 Encounter for general adult medical examination without abnormal findings: Secondary | ICD-10-CM | POA: Diagnosis not present

## 2015-11-18 DIAGNOSIS — Z1389 Encounter for screening for other disorder: Secondary | ICD-10-CM | POA: Diagnosis not present

## 2015-11-18 DIAGNOSIS — I10 Essential (primary) hypertension: Secondary | ICD-10-CM | POA: Diagnosis not present

## 2015-11-18 DIAGNOSIS — Z7984 Long term (current) use of oral hypoglycemic drugs: Secondary | ICD-10-CM | POA: Diagnosis not present

## 2015-11-18 DIAGNOSIS — M069 Rheumatoid arthritis, unspecified: Secondary | ICD-10-CM | POA: Diagnosis not present

## 2015-11-18 DIAGNOSIS — E7800 Pure hypercholesterolemia, unspecified: Secondary | ICD-10-CM | POA: Diagnosis not present

## 2015-11-18 DIAGNOSIS — Z7901 Long term (current) use of anticoagulants: Secondary | ICD-10-CM | POA: Diagnosis not present

## 2015-11-18 DIAGNOSIS — G4733 Obstructive sleep apnea (adult) (pediatric): Secondary | ICD-10-CM | POA: Diagnosis not present

## 2015-11-18 DIAGNOSIS — E1122 Type 2 diabetes mellitus with diabetic chronic kidney disease: Secondary | ICD-10-CM | POA: Diagnosis not present

## 2015-11-18 DIAGNOSIS — N182 Chronic kidney disease, stage 2 (mild): Secondary | ICD-10-CM | POA: Diagnosis not present

## 2015-11-18 DIAGNOSIS — I480 Paroxysmal atrial fibrillation: Secondary | ICD-10-CM | POA: Diagnosis not present

## 2015-11-18 DIAGNOSIS — K2270 Barrett's esophagus without dysplasia: Secondary | ICD-10-CM | POA: Diagnosis not present

## 2015-11-18 DIAGNOSIS — N400 Enlarged prostate without lower urinary tract symptoms: Secondary | ICD-10-CM | POA: Diagnosis not present

## 2015-11-22 ENCOUNTER — Encounter: Payer: Self-pay | Admitting: Gastroenterology

## 2015-11-23 DIAGNOSIS — R7989 Other specified abnormal findings of blood chemistry: Secondary | ICD-10-CM | POA: Diagnosis not present

## 2015-11-23 DIAGNOSIS — I160 Hypertensive urgency: Secondary | ICD-10-CM | POA: Diagnosis not present

## 2015-11-23 DIAGNOSIS — Z96612 Presence of left artificial shoulder joint: Secondary | ICD-10-CM | POA: Diagnosis not present

## 2015-11-23 DIAGNOSIS — S46812D Strain of other muscles, fascia and tendons at shoulder and upper arm level, left arm, subsequent encounter: Secondary | ICD-10-CM | POA: Diagnosis not present

## 2015-11-23 DIAGNOSIS — I10 Essential (primary) hypertension: Secondary | ICD-10-CM | POA: Diagnosis not present

## 2015-11-29 ENCOUNTER — Other Ambulatory Visit: Payer: Self-pay | Admitting: Orthopedic Surgery

## 2015-11-29 DIAGNOSIS — Z96612 Presence of left artificial shoulder joint: Secondary | ICD-10-CM

## 2015-11-30 DIAGNOSIS — Z7901 Long term (current) use of anticoagulants: Secondary | ICD-10-CM | POA: Diagnosis not present

## 2015-11-30 DIAGNOSIS — R972 Elevated prostate specific antigen [PSA]: Secondary | ICD-10-CM | POA: Diagnosis not present

## 2015-12-02 ENCOUNTER — Other Ambulatory Visit: Payer: PPO

## 2015-12-02 DIAGNOSIS — K591 Functional diarrhea: Secondary | ICD-10-CM | POA: Diagnosis not present

## 2015-12-02 DIAGNOSIS — K219 Gastro-esophageal reflux disease without esophagitis: Secondary | ICD-10-CM | POA: Diagnosis not present

## 2015-12-02 DIAGNOSIS — I10 Essential (primary) hypertension: Secondary | ICD-10-CM | POA: Diagnosis not present

## 2015-12-06 ENCOUNTER — Ambulatory Visit
Admission: RE | Admit: 2015-12-06 | Discharge: 2015-12-06 | Disposition: A | Discharge status: Home / Self Care | Payer: PPO | Source: Ambulatory Visit | Attending: Orthopedic Surgery | Admitting: Orthopedic Surgery

## 2015-12-06 DIAGNOSIS — Z96612 Presence of left artificial shoulder joint: Secondary | ICD-10-CM

## 2015-12-06 DIAGNOSIS — R197 Diarrhea, unspecified: Secondary | ICD-10-CM | POA: Diagnosis not present

## 2015-12-06 DIAGNOSIS — Z471 Aftercare following joint replacement surgery: Secondary | ICD-10-CM | POA: Diagnosis not present

## 2015-12-06 DIAGNOSIS — R937 Abnormal findings on diagnostic imaging of other parts of musculoskeletal system: Secondary | ICD-10-CM | POA: Diagnosis not present

## 2015-12-06 DIAGNOSIS — K2270 Barrett's esophagus without dysplasia: Secondary | ICD-10-CM | POA: Diagnosis not present

## 2015-12-06 DIAGNOSIS — R112 Nausea with vomiting, unspecified: Secondary | ICD-10-CM | POA: Diagnosis not present

## 2015-12-06 DIAGNOSIS — Z86010 Personal history of colonic polyps: Secondary | ICD-10-CM | POA: Diagnosis not present

## 2015-12-07 DIAGNOSIS — Z7901 Long term (current) use of anticoagulants: Secondary | ICD-10-CM | POA: Diagnosis not present

## 2015-12-14 DIAGNOSIS — M25612 Stiffness of left shoulder, not elsewhere classified: Secondary | ICD-10-CM | POA: Diagnosis not present

## 2015-12-14 DIAGNOSIS — T8484XA Pain due to internal orthopedic prosthetic devices, implants and grafts, initial encounter: Secondary | ICD-10-CM | POA: Diagnosis not present

## 2015-12-14 DIAGNOSIS — Z96612 Presence of left artificial shoulder joint: Secondary | ICD-10-CM | POA: Diagnosis not present

## 2015-12-21 DIAGNOSIS — Z7901 Long term (current) use of anticoagulants: Secondary | ICD-10-CM | POA: Diagnosis not present

## 2015-12-22 NOTE — H&P (Signed)
[name] is an 75 y.o. male.    Chief Complaint: left shoulder pain  HPI: Pt is a 75 y.o. male complaining of left shoulder pain for multiple years. Pain had continually increased since the beginning. X-rays in the clinic show previous left total shoulder arthroplasty with suspected loose glenoid component. Pt has tried various conservative treatments which have failed to alleviate their symptoms, including therapy. Various options are discussed with the patient. Risks, benefits and expectations were discussed with the patient. Patient understand the risks, benefits and expectations and wishes to proceed with surgery.   PCP:  Georgann Housekeeper, MD  D/C Plans: Home  PMH: Past Medical History:  Diagnosis Date  . Arthritis   . Barrett's esophagus   . Chronic kidney disease   . Coronary artery disease    a. h/o CABG 1997. b. stenting to graft-RCA, CB to LAD after LIMA in 2006. c. stenting to VG-RCA beyond initial stent and in-stent restenosis in 2011. c. Complex PCI 07/2013 to SVG-RCA and distal native RCA involving 4 sites (see cath report).  . Diabetes   . GERD (gastroesophageal reflux disease)   . Hyperlipidemia   . Hypertension   . Myocardial infarction (HCC)   . Pre-syncope    a. 09/2012: in the setting of mild dehydration with significant straining possibly inducing a vagal event while he was dragging heavy logs.   . Sleep apnea    hx of     PSH: Past Surgical History:  Procedure Laterality Date  . CARDIOVERSION N/A 12/29/2013   Procedure: CARDIOVERSION;  Surgeon: Laurey Morale, MD;  Location: [facility] ENDOSCOPY;  Service: Cardiovascular;  Laterality: N/A;  . CORONARY ANGIOPLASTY WITH STENT PLACEMENT    . CORONARY ARTERY BYPASS GRAFT    . CORONARY STENT PLACEMENT  07/30/2013   DES to RCA     DR KELLY  . HERNIA REPAIR    . JOINT REPLACEMENT    . KIDNEY STONE SURGERY    . LEFT HEART CATHETERIZATION WITH CORONARY/GRAFT ANGIOGRAM N/A 07/30/2013   Procedure: LEFT HEART  CATHETERIZATION WITH Isabel Caprice;  Surgeon: Lennette Bihari, MD;  Location: [facility] CATH LAB;  Service: Cardiovascular;  Laterality: N/A;  . PERCUTANEOUS CORONARY INTERVENTION-BALLOON ONLY  07/30/2013   Procedure: PERCUTANEOUS CORONARY INTERVENTION-BALLOON ONLY;  Surgeon: Lennette Bihari, MD;  Location: [facility] CATH LAB;  Service: Cardiovascular;;  . PERCUTANEOUS CORONARY STENT INTERVENTION (PCI-S)  07/30/2013   Procedure: PERCUTANEOUS CORONARY STENT INTERVENTION (PCI-S);  Surgeon: Lennette Bihari, MD;  Location: [facility] CATH LAB;  Service: Cardiovascular;;  . SHOULDER SURGERY      Social History:  reports that he has never smoked. He has never used smokeless tobacco. He reports that he drinks alcohol. He reports that he does not use drugs.  Allergies:  Allergies  Allergen Reactions  . Ivp Dye [Iodinated Diagnostic Agents] Other (See Comments)    Increased blood pressure, heart rate    Medications: No current facility-administered medications for this encounter.    Current Outpatient Prescriptions  Medication Sig Dispense Refill  . amiodarone (PACERONE) 200 MG tablet Take 200 mg by mouth daily.    . carvedilol (COREG) 25 MG tablet Take 25 mg by mouth 2 (two) times daily with a meal.    . clopidogrel (PLAVIX) 75 MG tablet TAKE 1 TABLET EVERY DAY 30 tablet 11  . digoxin (LANOXIN) 0.125 MG tablet Take 1 tablet (0.125 mg total) by mouth daily. 30 tablet 6  . diltiazem (CARDIZEM CD) 120 MG 24 hr capsule Take 1  capsule (120 mg total) by mouth 2 (two) times daily before a meal. 60 capsule 5  . ezetimibe-simvastatin (VYTORIN) 10-20 MG per tablet Take 1 tablet by mouth daily.    . folic acid (FOLVITE) 1 MG tablet Take 1 mg by mouth daily with lunch.     . furosemide (LASIX) 20 MG tablet Take 1 tablet as needed for swelling 30 tablet 11  . glimepiride (AMARYL) 1 MG tablet Take 1 mg by mouth daily.     Marland Kitchen losartan (COZAAR) 100 MG tablet Take 1/2 tablet twice daily 30 tablet 11  . metFORMIN (GLUCOPHAGE)  1000 MG tablet Take 1,000 mg by mouth 2 (two) times daily with a meal.    . nitroGLYCERIN (NITROSTAT) 0.4 MG SL tablet Place 0.4 mg under the tongue every 5 (five) minutes as needed for chest pain.    . ONE TOUCH ULTRA TEST test strip   2  . predniSONE (DELTASONE) 1 MG tablet Take 3 mg by mouth daily with breakfast.     . warfarin (COUMADIN) 5 MG tablet Take 1 tablet (5 mg total) by mouth daily. 90 tablet 3    No results found for this or any previous visit (from the past 48 hour(s)). No results found.  ROS: Pain with rom of the left upper extremity  Physical Exam:  Alert and oriented 75 y.o. male in no acute distress Cranial nerves 2-12 intact Cervical spine: full rom with no tenderness, nv intact distally Chest: active breath sounds bilaterally, no wheeze rhonchi or rales Heart: regular rate and rhythm, no murmur Abd: non tender non distended with active bowel sounds Hip is stable with rom  Left upper extremity with painful rom nv intact distally Strength limited due to pain with ER and IR No rashes  Assessment/Plan Assessment: failed left total shoulder arthroplasty   Plan: Patient will undergo a left total shoulder arthroplasty revision by Dr. Ranell Patrick at [facility]. Risks benefits and expectations were discussed with the patient. Patient understand risks, benefits and expectations and wishes to proceed.

## 2015-12-23 DIAGNOSIS — Z7901 Long term (current) use of anticoagulants: Secondary | ICD-10-CM | POA: Diagnosis not present

## 2015-12-27 ENCOUNTER — Encounter (HOSPITAL_COMMUNITY)
Admission: RE | Admit: 2015-12-27 | Discharge: 2015-12-27 | Disposition: A | Discharge status: Home / Self Care | Payer: PPO | Source: Ambulatory Visit | Attending: Orthopedic Surgery | Admitting: Orthopedic Surgery

## 2015-12-27 ENCOUNTER — Encounter (HOSPITAL_COMMUNITY): Payer: Self-pay

## 2015-12-27 DIAGNOSIS — I509 Heart failure, unspecified: Secondary | ICD-10-CM | POA: Diagnosis not present

## 2015-12-27 DIAGNOSIS — N189 Chronic kidney disease, unspecified: Secondary | ICD-10-CM | POA: Diagnosis not present

## 2015-12-27 DIAGNOSIS — E119 Type 2 diabetes mellitus without complications: Secondary | ICD-10-CM

## 2015-12-27 DIAGNOSIS — E1122 Type 2 diabetes mellitus with diabetic chronic kidney disease: Secondary | ICD-10-CM | POA: Diagnosis not present

## 2015-12-27 DIAGNOSIS — E785 Hyperlipidemia, unspecified: Secondary | ICD-10-CM | POA: Diagnosis not present

## 2015-12-27 DIAGNOSIS — Z01810 Encounter for preprocedural cardiovascular examination: Secondary | ICD-10-CM | POA: Insufficient documentation

## 2015-12-27 DIAGNOSIS — Z472 Encounter for removal of internal fixation device: Secondary | ICD-10-CM | POA: Diagnosis not present

## 2015-12-27 DIAGNOSIS — Z7984 Long term (current) use of oral hypoglycemic drugs: Secondary | ICD-10-CM | POA: Diagnosis not present

## 2015-12-27 DIAGNOSIS — Z01812 Encounter for preprocedural laboratory examination: Secondary | ICD-10-CM | POA: Insufficient documentation

## 2015-12-27 DIAGNOSIS — I252 Old myocardial infarction: Secondary | ICD-10-CM | POA: Diagnosis not present

## 2015-12-27 DIAGNOSIS — M659 Synovitis and tenosynovitis, unspecified: Secondary | ICD-10-CM | POA: Diagnosis not present

## 2015-12-27 DIAGNOSIS — Z955 Presence of coronary angioplasty implant and graft: Secondary | ICD-10-CM

## 2015-12-27 DIAGNOSIS — Z7901 Long term (current) use of anticoagulants: Secondary | ICD-10-CM | POA: Diagnosis not present

## 2015-12-27 DIAGNOSIS — I129 Hypertensive chronic kidney disease with stage 1 through stage 4 chronic kidney disease, or unspecified chronic kidney disease: Secondary | ICD-10-CM | POA: Diagnosis not present

## 2015-12-27 DIAGNOSIS — K219 Gastro-esophageal reflux disease without esophagitis: Secondary | ICD-10-CM | POA: Diagnosis not present

## 2015-12-27 DIAGNOSIS — G8918 Other acute postprocedural pain: Secondary | ICD-10-CM | POA: Diagnosis not present

## 2015-12-27 DIAGNOSIS — I110 Hypertensive heart disease with heart failure: Secondary | ICD-10-CM | POA: Diagnosis not present

## 2015-12-27 DIAGNOSIS — Z96612 Presence of left artificial shoulder joint: Secondary | ICD-10-CM | POA: Diagnosis not present

## 2015-12-27 DIAGNOSIS — M25512 Pain in left shoulder: Secondary | ICD-10-CM | POA: Diagnosis not present

## 2015-12-27 DIAGNOSIS — Y831 Surgical operation with implant of artificial internal device as the cause of abnormal reaction of the patient, or of later complication, without mention of misadventure at the time of the procedure: Secondary | ICD-10-CM | POA: Diagnosis not present

## 2015-12-27 DIAGNOSIS — Z951 Presence of aortocoronary bypass graft: Secondary | ICD-10-CM

## 2015-12-27 DIAGNOSIS — I2510 Atherosclerotic heart disease of native coronary artery without angina pectoris: Secondary | ICD-10-CM | POA: Insufficient documentation

## 2015-12-27 DIAGNOSIS — Z7902 Long term (current) use of antithrombotics/antiplatelets: Secondary | ICD-10-CM | POA: Diagnosis not present

## 2015-12-27 DIAGNOSIS — I10 Essential (primary) hypertension: Secondary | ICD-10-CM | POA: Insufficient documentation

## 2015-12-27 DIAGNOSIS — T84038A Mechanical loosening of other internal prosthetic joint, initial encounter: Secondary | ICD-10-CM | POA: Diagnosis not present

## 2015-12-27 DIAGNOSIS — Z87891 Personal history of nicotine dependence: Secondary | ICD-10-CM | POA: Diagnosis not present

## 2015-12-27 DIAGNOSIS — Z471 Aftercare following joint replacement surgery: Secondary | ICD-10-CM | POA: Diagnosis not present

## 2015-12-27 DIAGNOSIS — T8489XA Other specified complication of internal orthopedic prosthetic devices, implants and grafts, initial encounter: Secondary | ICD-10-CM | POA: Diagnosis not present

## 2015-12-27 DIAGNOSIS — G4730 Sleep apnea, unspecified: Secondary | ICD-10-CM | POA: Diagnosis not present

## 2015-12-27 HISTORY — DX: Unspecified atrial fibrillation: I48.91

## 2015-12-27 HISTORY — DX: Personal history of urinary calculi: Z87.442

## 2015-12-27 HISTORY — DX: Dyspnea, unspecified: R06.00

## 2015-12-27 LAB — BASIC METABOLIC PANEL
Anion gap: 11 (ref 5–15)
BUN: 24 mg/dL — AB (ref 6–20)
CHLORIDE: 99 mmol/L — AB (ref 101–111)
CO2: 25 mmol/L (ref 22–32)
CREATININE: 1.39 mg/dL — AB (ref 0.61–1.24)
Calcium: 9.3 mg/dL (ref 8.9–10.3)
GFR calc Af Amer: 56 mL/min — ABNORMAL LOW (ref >60)
GFR calc non Af Amer: 48 mL/min — ABNORMAL LOW (ref >60)
Glucose, Bld: 264 mg/dL — ABNORMAL HIGH (ref 65–99)
POTASSIUM: 3.5 mmol/L (ref 3.5–5.1)
Sodium: 135 mmol/L (ref 135–145)

## 2015-12-27 LAB — CBC
HEMATOCRIT: 35.7 % — AB (ref 39.0–52.0)
Hemoglobin: 12.1 g/dL — ABNORMAL LOW (ref 13.0–17.0)
MCH: 31.6 pg (ref 26.0–34.0)
MCHC: 33.9 g/dL (ref 30.0–36.0)
MCV: 93.2 fL (ref 78.0–100.0)
PLATELETS: 282 10*3/uL (ref 150–400)
RBC: 3.83 MIL/uL — AB (ref 4.22–5.81)
RDW: 13.5 % (ref 11.5–15.5)
WBC: 9.8 10*3/uL (ref 4.0–10.5)

## 2015-12-27 LAB — SURGICAL PCR SCREEN
MRSA, PCR: NEGATIVE
Staphylococcus aureus: NEGATIVE

## 2015-12-27 LAB — TYPE AND SCREEN
ABO/RH(D): O POS
ANTIBODY SCREEN: NEGATIVE

## 2015-12-27 LAB — GLUCOSE, CAPILLARY: Glucose-Capillary: 314 mg/dL — ABNORMAL HIGH (ref 65–99)

## 2015-12-27 LAB — ABO/RH: ABO/RH(D): O POS

## 2015-12-27 LAB — PROTIME-INR
INR: 1.43
Prothrombin Time: 17.6 seconds — ABNORMAL HIGH (ref 11.4–15.2)

## 2015-12-27 NOTE — Progress Notes (Signed)
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 CBG was  314 today. Patient reported that he had cooked apples and Sweet Tea at lunch today. PCP is Dr Donette Larry at [place], I called and patient had an A1C on 10.5 and it was 6.7. I went over "How to manage Dm pre and Post Op with patient and his wife. [name] has a history of CAD, had CABG in 2000 and cardiac stents since, last on 2015. Patient is on Coumadin, last dose was 11/7 - PT/INR was 5 per [name].  Patient's cardiologist is Dr Nadara Eaton,  Patient was seen by Dr Nadara Eaton in July of this year, per [name] and patient had an EKG at that time.  Patient denies chest pain or shortness of breath at rest. [name] said that Dr Nadara Eaton has not been notified as far as he is aware, "he has been out of town'>

## 2015-12-27 NOTE — Pre-Procedure Instructions (Addendum)
    SHENOUDA GENOVA  12/27/2015      Your procedure is scheduled on Wednesday, November 15.  Report to [facility] Admitting at 12:15 PM                 Your surgery or procedure is scheduled for 14:15 PM   Call this number if you have problems the morning of surgery: [phone]   Remember:  Do not eat food or drink liquids after midnight Tuesday, November 14.  Take these medicines the morning of surgery with A SIP OF WATER: carvedilol (COREG), digoxin (LANOXIN), pantoprazole (PROTONIX).               DO NOT take glimepiride (AMARYL) the morning of surgery.              DO NOT take Glimepiride (Amaryl) the evening before surgery- Tuesday.              May take Tramadol and Nitroglycerin if needed.   Do not wear jewelry, make-up or nail polish.  Do not wear lotions, powders, or perfumes, or deodrant.            M en may shave face and neck.  Do not bring valuables to the hospital.  [facility] is not responsible for any belongings or valuables.  Contacts, dentures or bridgework may not be worn into surgery.  Leave your suitcase in the car.  After surgery it may be brought to your room.  For patients admitted to the hospital, discharge time will be determined by your treatment team.  Special instructions: Review  [facility] - Preparing For Surgery.  Please read over the following fact sheets that you were given.  - Preparing For Surgery and Patient Instructions for Mupirocin Application, Incentive Spirometry, Pain Booklet

## 2015-12-27 NOTE — Progress Notes (Signed)
   How to Manage Your Diabetes Before and After Surgery  Why is it important to control my blood sugar before and after surgery? . Improving blood sugar levels before and after surgery helps healing and can limit problems. . A way of improving blood sugar control is eating a healthy diet by: o  Eating less sugar and carbohydrates o  Increasing activity/exercise o  Talking with your doctor about reaching your blood sugar goals . High blood sugars (greater than 180 mg/dL) can raise your risk of infections and slow your recovery, so you will need to focus on controlling your diabetes during the weeks before surgery. . Make sure that the doctor who takes care of your diabetes knows about your planned surgery including the date and location.  How do I manage my blood sugar before surgery? . Check your blood sugar at least 4 times a day, starting 2 days before surgery, to make sure that the level is not too high or low. o Check your blood sugar the morning of your surgery when you wake up and every 2 hours until you get to the Short Stay unit. . If your blood sugar is less than 70 mg/dL, you will need to treat for low blood sugar: o Do not take insulin. o Treat a low blood sugar (less than 70 mg/dL) with  cup of clear juice (cranberry or apple), 4 glucose tablets, OR glucose gel. o Recheck blood sugar in 15 minutes after treatment (to make sure it is greater than 70 mg/dL). If your blood sugar is not greater than 70 mg/dL on recheck, call [phone] for further instructions. . Report your blood sugar to the short stay nurse when you get to Short Stay.  . If you are admitted to the hospital after surgery: o Your blood sugar will be checked by the staff and you will probably be given insulin after surgery (instead of oral diabetes medicines) to make sure you have good blood sugar levels. o The goal for blood sugar control after surgery is 80-180 mg/dL.  WHAT DO I DO ABOUT MY DIABETES  MEDICATION?  . Do not take oral diabetes medicines (pills) the morning of surgery.   Patient Signature:  Date:   Nurse Signature:  Date:   Reviewed and Endorsed by [facility] Patient Education Committee, August 2015 

## 2015-12-28 ENCOUNTER — Encounter (HOSPITAL_COMMUNITY): Payer: Self-pay

## 2015-12-28 MED ORDER — CEFAZOLIN SODIUM-DEXTROSE 2-4 GM/100ML-% IV SOLN
2.0000 g | INTRAVENOUS | Status: AC
  Administered 2015-12-29: 2 g via INTRAVENOUS
  Filled 2015-12-28 (×2): qty 100

## 2015-12-28 NOTE — Progress Notes (Signed)
Anesthesia Chart Review:  Pt is a 75 year old [name] scheduled for L total shoulder arthroplasty on 12/29/2015 with Beverely Low, MD.   - Cardiologist is Delrae Rend, MD, last office visit 08/27/15. Follow up in 1 year recommended.  - PCP is Georgann Housekeeper, MD.   PMH includes:  CAD (s/p CABG 1997; stent to graft-RCA, CB to LAD after LIMA 2006; stent to graft-RCA 2011; PCI and DES to graft-RCA and distal native RCA 2015), PAF (successful cardioversion 12/2013), HTN, pre-syncope, DM, hyperlipidemia, CKD, OSA, GERD. Former smoker. BMI 25.5  Medications include: carvedilol, chlorthalidone, digoxin, diltiazem, lasix, glimepiride, protonix, simvastatin, valsartan, coumadin. Last dose coumadin 12/21/15.   Preoperative labs reviewed.   - Glucose 264. HgbA1c at PCP's office was 6.7 on [name]/5/17.  - PT 17.6. Will recheck DOS.  - Cr 1.39, BUN 24; results are stable when compared to 2015 values  EKG 08/27/15: NSR, left abnormality, LVH with repolarization abnormality, cannot exclude inferior and lateral ischemia. Per Dr. Nadara Eaton, no significant change from EKG 08/27/14.   Echo 10/29/13:  - Left ventricle: The cavity size was normal. Wall thickness was increased in a pattern of mild LVH. The estimated ejection fraction was 55%. There was basal inferior severe hypokinesis.Indeterminant diastolic function (atrial fibrillation). - Aortic valve: There was no stenosis. - Mitral valve: Mildly calcified annulus. There was no significant regurgitation. - Left atrium: The atrium was moderately dilated. - Right ventricle: The cavity size was normal. Systolic function was normal. - Tricuspid valve: Peak RV-RA gradient (S): 12 mm Hg. - Pulmonary arteries: PA peak pressure: 15 mm Hg (S). - Inferior vena cava: The vessel was normal in size. The respirophasic diameter changes were in the normal range (>= 50%), consistent with normal central venous pressure. - Impressions: The patient was in atrial fibrillation. Technically  difficult study with poor acoustic windows. Normal LV size with mild LV hypertrophy. EF 55% with basal inferior severe hypokinesis. Normal RV size and systolic function. No significant valvular abnormalities.  Cardiac cath 07/30/13:  - Significant native coronary artery disease with proximal 80% LAD stenoses and total mid LAD occlusion; 90% circumflex stenosis; and total proximal occlusion of the native RCA. - Patent LIMA graft to the LAD. - Old SVG occlusion of the graft which had supplied the circumflex marginal vessel. - SVG to RCA, with 70% new proximal stenosis, 99% in-stent restenosis in the proximal segment of the more proximal previously placed graft, 50-60% in-stent restenosis in the previously placed mid RCA stent, new 80 to percent distal anastomosis stenosis extending into the native RCA, and 95% calcified PLA stenosis. - Successful 4 lesion coronary intervention involving the SVG to RCA proximally, mid, and distally, as well as the PLA branch of the distal native RCA  If no changes, I anticipate pt can proceed with surgery as scheduled.   [name], FNP-BC [facility] Stay [facility]/Anesthesiology Phone: [phone] 12/28/2015 11:06 AM

## 2015-12-29 ENCOUNTER — Encounter (HOSPITAL_COMMUNITY)
Admission: RE | Disposition: A | Discharge status: Home / Self Care | Payer: Self-pay | Source: Ambulatory Visit | Attending: Orthopedic Surgery

## 2015-12-29 ENCOUNTER — Inpatient Hospital Stay (HOSPITAL_COMMUNITY): Payer: PPO

## 2015-12-29 ENCOUNTER — Inpatient Hospital Stay (HOSPITAL_COMMUNITY)
Admission: RE | Admit: 2015-12-29 | Discharge: 2015-12-30 | DRG: 483 | Disposition: A | Discharge status: Home / Self Care | Payer: PPO | Source: Ambulatory Visit | Attending: Orthopedic Surgery | Admitting: Orthopedic Surgery

## 2015-12-29 ENCOUNTER — Inpatient Hospital Stay (HOSPITAL_COMMUNITY): Payer: PPO | Admitting: Emergency Medicine

## 2015-12-29 ENCOUNTER — Encounter (HOSPITAL_COMMUNITY): Payer: Self-pay | Admitting: Certified Registered"

## 2015-12-29 DIAGNOSIS — T84038A Mechanical loosening of other internal prosthetic joint, initial encounter: Secondary | ICD-10-CM | POA: Diagnosis not present

## 2015-12-29 DIAGNOSIS — I252 Old myocardial infarction: Secondary | ICD-10-CM | POA: Diagnosis not present

## 2015-12-29 DIAGNOSIS — G4730 Sleep apnea, unspecified: Secondary | ICD-10-CM | POA: Diagnosis not present

## 2015-12-29 DIAGNOSIS — I129 Hypertensive chronic kidney disease with stage 1 through stage 4 chronic kidney disease, or unspecified chronic kidney disease: Secondary | ICD-10-CM | POA: Diagnosis not present

## 2015-12-29 DIAGNOSIS — E1122 Type 2 diabetes mellitus with diabetic chronic kidney disease: Secondary | ICD-10-CM | POA: Diagnosis not present

## 2015-12-29 DIAGNOSIS — T8489XA Other specified complication of internal orthopedic prosthetic devices, implants and grafts, initial encounter: Secondary | ICD-10-CM | POA: Diagnosis not present

## 2015-12-29 DIAGNOSIS — G8918 Other acute postprocedural pain: Secondary | ICD-10-CM | POA: Diagnosis not present

## 2015-12-29 DIAGNOSIS — Z951 Presence of aortocoronary bypass graft: Secondary | ICD-10-CM | POA: Diagnosis not present

## 2015-12-29 DIAGNOSIS — I2510 Atherosclerotic heart disease of native coronary artery without angina pectoris: Secondary | ICD-10-CM | POA: Diagnosis present

## 2015-12-29 DIAGNOSIS — Z87891 Personal history of nicotine dependence: Secondary | ICD-10-CM

## 2015-12-29 DIAGNOSIS — Y831 Surgical operation with implant of artificial internal device as the cause of abnormal reaction of the patient, or of later complication, without mention of misadventure at the time of the procedure: Secondary | ICD-10-CM | POA: Diagnosis not present

## 2015-12-29 DIAGNOSIS — I509 Heart failure, unspecified: Secondary | ICD-10-CM | POA: Diagnosis not present

## 2015-12-29 DIAGNOSIS — Z7984 Long term (current) use of oral hypoglycemic drugs: Secondary | ICD-10-CM | POA: Diagnosis not present

## 2015-12-29 DIAGNOSIS — Z472 Encounter for removal of internal fixation device: Secondary | ICD-10-CM | POA: Diagnosis not present

## 2015-12-29 DIAGNOSIS — E785 Hyperlipidemia, unspecified: Secondary | ICD-10-CM | POA: Diagnosis present

## 2015-12-29 DIAGNOSIS — N189 Chronic kidney disease, unspecified: Secondary | ICD-10-CM | POA: Diagnosis not present

## 2015-12-29 DIAGNOSIS — M25512 Pain in left shoulder: Secondary | ICD-10-CM | POA: Diagnosis not present

## 2015-12-29 DIAGNOSIS — I110 Hypertensive heart disease with heart failure: Secondary | ICD-10-CM | POA: Diagnosis not present

## 2015-12-29 DIAGNOSIS — Z96612 Presence of left artificial shoulder joint: Secondary | ICD-10-CM

## 2015-12-29 DIAGNOSIS — Z7902 Long term (current) use of antithrombotics/antiplatelets: Secondary | ICD-10-CM

## 2015-12-29 DIAGNOSIS — M659 Synovitis and tenosynovitis, unspecified: Secondary | ICD-10-CM | POA: Diagnosis present

## 2015-12-29 DIAGNOSIS — Z471 Aftercare following joint replacement surgery: Secondary | ICD-10-CM | POA: Diagnosis not present

## 2015-12-29 DIAGNOSIS — K219 Gastro-esophageal reflux disease without esophagitis: Secondary | ICD-10-CM | POA: Diagnosis present

## 2015-12-29 DIAGNOSIS — Z7901 Long term (current) use of anticoagulants: Secondary | ICD-10-CM

## 2015-12-29 HISTORY — PX: TOTAL SHOULDER REVISION: SHX6130

## 2015-12-29 LAB — PROTIME-INR
INR: 1.51
PROTHROMBIN TIME: 18.3 s — AB (ref 11.4–15.2)

## 2015-12-29 LAB — GLUCOSE, CAPILLARY
GLUCOSE-CAPILLARY: 130 mg/dL — AB (ref 65–99)
Glucose-Capillary: 158 mg/dL — ABNORMAL HIGH (ref 65–99)
Glucose-Capillary: 151 mg/dL — ABNORMAL HIGH (ref 65–99)

## 2015-12-29 SURGERY — REVISION, TOTAL ARTHROPLASTY, SHOULDER
Anesthesia: General | Site: Shoulder | Laterality: Left

## 2015-12-29 MED ORDER — PROPOFOL 10 MG/ML IV BOLUS
INTRAVENOUS | Status: DC | PRN
  Administered 2015-12-29: 90 mg via INTRAVENOUS

## 2015-12-29 MED ORDER — ACETAMINOPHEN 325 MG PO TABS: 650.0000 mg | ORAL_TABLET | Freq: Four times a day (QID) | ORAL | Status: DC | PRN

## 2015-12-29 MED ORDER — ACETAMINOPHEN 650 MG RE SUPP: 650.0000 mg | Freq: Four times a day (QID) | RECTAL | Status: DC | PRN

## 2015-12-29 MED ORDER — FENTANYL CITRATE (PF) 100 MCG/2ML IJ SOLN
INTRAMUSCULAR | Status: AC
Start: 2015-12-29 — End: 2015-12-30
  Filled 2015-12-29: qty 2

## 2015-12-29 MED ORDER — PHENOL 1.4 % MT LIQD: 1.0000 | OROMUCOSAL | Status: DC | PRN

## 2015-12-29 MED ORDER — DILTIAZEM HCL ER COATED BEADS 120 MG PO CP24
120.0000 mg | ORAL_CAPSULE | Freq: Every day | ORAL | Status: DC
  Administered 2015-12-29 – 2015-12-30 (×2): 120 mg via ORAL
  Filled 2015-12-29 (×2): qty 1

## 2015-12-29 MED ORDER — DOCUSATE SODIUM 100 MG PO CAPS
100.0000 mg | ORAL_CAPSULE | Freq: Two times a day (BID) | ORAL | Status: DC
  Administered 2015-12-29 – 2015-12-30 (×2): 100 mg via ORAL
  Filled 2015-12-29 (×2): qty 1

## 2015-12-29 MED ORDER — ONDANSETRON HCL 4 MG/2ML IJ SOLN: 4.0000 mg | Freq: Four times a day (QID) | INTRAMUSCULAR | Status: DC | PRN

## 2015-12-29 MED ORDER — CHLORTHALIDONE 25 MG PO TABS
25.0000 mg | ORAL_TABLET | Freq: Every day | ORAL | Status: DC
  Administered 2015-12-29 – 2015-12-30 (×2): 25 mg via ORAL
  Filled 2015-12-29 (×2): qty 1

## 2015-12-29 MED ORDER — SENNA 8.6 MG PO TABS: 2.0000 | ORAL_TABLET | ORAL | Status: DC | PRN

## 2015-12-29 MED ORDER — MORPHINE SULFATE (PF) 2 MG/ML IV SOLN: 2.0000 mg | INTRAVENOUS | Status: DC | PRN

## 2015-12-29 MED ORDER — WARFARIN SODIUM 5 MG PO TABS: 5.0000 mg | ORAL_TABLET | Freq: Every day | ORAL | Status: DC

## 2015-12-29 MED ORDER — MENTHOL 3 MG MT LOZG: 1.0000 | LOZENGE | OROMUCOSAL | Status: DC | PRN

## 2015-12-29 MED ORDER — SODIUM CHLORIDE 0.9 % IV SOLN
INTRAVENOUS | Status: DC
  Administered 2015-12-29: 21:00:00 via INTRAVENOUS

## 2015-12-29 MED ORDER — SODIUM CHLORIDE 0.9 % IR SOLN
Status: DC | PRN
Start: 2015-12-29 — End: 2015-12-29
  Administered 2015-12-29: 1000 mL

## 2015-12-29 MED ORDER — METOCLOPRAMIDE HCL 5 MG PO TABS: 5.0000 mg | ORAL_TABLET | Freq: Three times a day (TID) | ORAL | Status: DC | PRN

## 2015-12-29 MED ORDER — FENTANYL CITRATE (PF) 100 MCG/2ML IJ SOLN
INTRAMUSCULAR | Status: AC
  Administered 2015-12-29: 50 ug
  Filled 2015-12-29: qty 2

## 2015-12-29 MED ORDER — HYDROCODONE-ACETAMINOPHEN 5-325 MG PO TABS
1.0000 | ORAL_TABLET | ORAL | Status: DC | PRN
  Administered 2015-12-29 – 2015-12-30 (×3): 2 via ORAL
  Filled 2015-12-29 (×2): qty 2

## 2015-12-29 MED ORDER — SIMVASTATIN 20 MG PO TABS: 20.0000 mg | ORAL_TABLET | Freq: Every day | ORAL | Status: DC

## 2015-12-29 MED ORDER — FENTANYL CITRATE (PF) 100 MCG/2ML IJ SOLN
INTRAMUSCULAR | Status: DC | PRN
  Administered 2015-12-29: 50 ug via INTRAVENOUS

## 2015-12-29 MED ORDER — IRBESARTAN 75 MG PO TABS
75.0000 mg | ORAL_TABLET | Freq: Every day | ORAL | Status: DC
  Administered 2015-12-29 – 2015-12-30 (×2): 75 mg via ORAL
  Filled 2015-12-29 (×2): qty 1

## 2015-12-29 MED ORDER — WARFARIN - PHYSICIAN DOSING INPATIENT: Freq: Every day | Status: DC

## 2015-12-29 MED ORDER — PROPOFOL 10 MG/ML IV BOLUS
INTRAVENOUS | Status: AC
  Filled 2015-12-29: qty 20

## 2015-12-29 MED ORDER — LIDOCAINE 2% (20 MG/ML) 5 ML SYRINGE
INTRAMUSCULAR | Status: AC
  Filled 2015-12-29: qty 5

## 2015-12-29 MED ORDER — PHENYLEPHRINE HCL 10 MG/ML IJ SOLN
INTRAMUSCULAR | Status: DC | PRN
  Administered 2015-12-29: 25 ug/min via INTRAVENOUS

## 2015-12-29 MED ORDER — DIGOXIN 125 MCG PO TABS
0.1250 mg | ORAL_TABLET | Freq: Every day | ORAL | Status: DC
  Administered 2015-12-30: 0.125 mg via ORAL
  Filled 2015-12-29: qty 1

## 2015-12-29 MED ORDER — HYDROCODONE-ACETAMINOPHEN 5-325 MG PO TABS: 1.0000 | ORAL_TABLET | Freq: Four times a day (QID) | ORAL | 0 refills | Status: DC | PRN

## 2015-12-29 MED ORDER — GLIMEPIRIDE 2 MG PO TABS
2.0000 mg | ORAL_TABLET | Freq: Every day | ORAL | Status: DC
  Administered 2015-12-30: 2 mg via ORAL
  Filled 2015-12-29 (×2): qty 1

## 2015-12-29 MED ORDER — TRAMADOL HCL 50 MG PO TABS
50.0000 mg | ORAL_TABLET | Freq: Two times a day (BID) | ORAL | Status: DC
  Administered 2015-12-29 – 2015-12-30 (×2): 50 mg via ORAL
  Filled 2015-12-29 (×2): qty 1

## 2015-12-29 MED ORDER — ONDANSETRON HCL 4 MG/2ML IJ SOLN
INTRAMUSCULAR | Status: AC
  Filled 2015-12-29: qty 2

## 2015-12-29 MED ORDER — BUPIVACAINE-EPINEPHRINE 0.25% -1:200000 IJ SOLN
INTRAMUSCULAR | Status: DC | PRN
  Administered 2015-12-29: 9 mL

## 2015-12-29 MED ORDER — LIDOCAINE HCL (CARDIAC) 20 MG/ML IV SOLN
INTRAVENOUS | Status: DC | PRN
  Administered 2015-12-29: 100 mg via INTRAVENOUS

## 2015-12-29 MED ORDER — EPHEDRINE 5 MG/ML INJ
INTRAVENOUS | Status: AC
  Filled 2015-12-29: qty 10

## 2015-12-29 MED ORDER — EPHEDRINE SULFATE-NACL 50-0.9 MG/10ML-% IV SOSY
PREFILLED_SYRINGE | INTRAVENOUS | Status: DC | PRN
Start: 2015-12-29 — End: 2015-12-29
  Administered 2015-12-29: 5 mg via INTRAVENOUS

## 2015-12-29 MED ORDER — ONDANSETRON HCL 4 MG/2ML IJ SOLN: 4.0000 mg | Freq: Once | INTRAMUSCULAR | Status: DC | PRN

## 2015-12-29 MED ORDER — METOCLOPRAMIDE HCL 5 MG/ML IJ SOLN
5.0000 mg | Freq: Three times a day (TID) | INTRAMUSCULAR | Status: DC | PRN
Start: 2015-12-29 — End: 2015-12-30

## 2015-12-29 MED ORDER — SUGAMMADEX SODIUM 200 MG/2ML IV SOLN
INTRAVENOUS | Status: DC | PRN
  Administered 2015-12-29: 200 mg via INTRAVENOUS

## 2015-12-29 MED ORDER — INSULIN ASPART 100 UNIT/ML ~~LOC~~ SOLN: 0.0000 [IU] | Freq: Every day | SUBCUTANEOUS | Status: DC

## 2015-12-29 MED ORDER — LACTATED RINGERS IV SOLN
INTRAVENOUS | Status: DC
  Administered 2015-12-29: 13:00:00 via INTRAVENOUS

## 2015-12-29 MED ORDER — FENTANYL CITRATE (PF) 100 MCG/2ML IJ SOLN
25.0000 ug | INTRAMUSCULAR | Status: DC | PRN
  Administered 2015-12-29 (×2): 50 ug via INTRAVENOUS

## 2015-12-29 MED ORDER — POLYETHYLENE GLYCOL 3350 17 G PO PACK: 17.0000 g | PACK | Freq: Every day | ORAL | Status: DC | PRN

## 2015-12-29 MED ORDER — FUROSEMIDE 20 MG PO TABS: 20.0000 mg | ORAL_TABLET | Freq: Every day | ORAL | Status: DC | PRN

## 2015-12-29 MED ORDER — SUGAMMADEX SODIUM 200 MG/2ML IV SOLN
INTRAVENOUS | Status: AC
  Filled 2015-12-29: qty 2

## 2015-12-29 MED ORDER — MIDAZOLAM HCL 2 MG/2ML IJ SOLN
INTRAMUSCULAR | Status: AC
  Administered 2015-12-29: 1 mg
  Filled 2015-12-29: qty 2

## 2015-12-29 MED ORDER — MEPERIDINE HCL 25 MG/ML IJ SOLN: 6.2500 mg | INTRAMUSCULAR | Status: DC | PRN

## 2015-12-29 MED ORDER — ONDANSETRON HCL 4 MG PO TABS: 4.0000 mg | ORAL_TABLET | Freq: Four times a day (QID) | ORAL | Status: DC | PRN

## 2015-12-29 MED ORDER — NITROGLYCERIN 0.4 MG SL SUBL: 0.4000 mg | SUBLINGUAL_TABLET | SUBLINGUAL | Status: DC | PRN

## 2015-12-29 MED ORDER — PANTOPRAZOLE SODIUM 40 MG PO TBEC
40.0000 mg | DELAYED_RELEASE_TABLET | Freq: Two times a day (BID) | ORAL | Status: DC
  Administered 2015-12-30: 40 mg via ORAL
  Filled 2015-12-29: qty 1

## 2015-12-29 MED ORDER — INSULIN ASPART 100 UNIT/ML ~~LOC~~ SOLN
0.0000 [IU] | Freq: Three times a day (TID) | SUBCUTANEOUS | Status: DC
  Administered 2015-12-30: 2 [IU] via SUBCUTANEOUS

## 2015-12-29 MED ORDER — HYDROCODONE-ACETAMINOPHEN 5-325 MG PO TABS
ORAL_TABLET | ORAL | Status: AC
Start: 2015-12-29 — End: 2015-12-30
  Filled 2015-12-29: qty 2

## 2015-12-29 MED ORDER — FENTANYL CITRATE (PF) 100 MCG/2ML IJ SOLN
INTRAMUSCULAR | Status: AC
  Filled 2015-12-29: qty 2

## 2015-12-29 MED ORDER — ROCURONIUM BROMIDE 100 MG/10ML IV SOLN
INTRAVENOUS | Status: DC | PRN
  Administered 2015-12-29: 50 mg via INTRAVENOUS

## 2015-12-29 MED ORDER — CHLORHEXIDINE GLUCONATE 4 % EX LIQD: 60.0000 mL | Freq: Once | CUTANEOUS | Status: DC

## 2015-12-29 MED ORDER — CARVEDILOL 25 MG PO TABS
25.0000 mg | ORAL_TABLET | Freq: Two times a day (BID) | ORAL | Status: DC
  Administered 2015-12-30: 25 mg via ORAL
  Filled 2015-12-29: qty 1

## 2015-12-29 MED ORDER — CEFAZOLIN SODIUM-DEXTROSE 2-4 GM/100ML-% IV SOLN
2.0000 g | Freq: Four times a day (QID) | INTRAVENOUS | Status: AC
  Administered 2015-12-29 – 2015-12-30 (×3): 2 g via INTRAVENOUS
  Filled 2015-12-29 (×4): qty 100

## 2015-12-29 MED ORDER — INSULIN ASPART 100 UNIT/ML ~~LOC~~ SOLN
4.0000 [IU] | Freq: Three times a day (TID) | SUBCUTANEOUS | Status: DC
  Administered 2015-12-30: 4 [IU] via SUBCUTANEOUS

## 2015-12-29 SURGICAL SUPPLY — 67 items
BONE CANC CHIPS 20CC PCAN1/4 (Bone Implant) ×2 IMPLANT
BOWL SMART MIX CTS (DISPOSABLE) IMPLANT
BRUSH FEMORAL CANAL (MISCELLANEOUS) IMPLANT
BUR SURG 4X8 MED (BURR) IMPLANT
BURR SURG 4X8 MED (BURR)
CHIPS CANC BONE 20CC PCAN1/4 (Bone Implant) ×1 IMPLANT
COVER SURGICAL LIGHT HANDLE (MISCELLANEOUS) ×2 IMPLANT
DRAPE INCISE IOBAN 66X45 STRL (DRAPES) ×6 IMPLANT
DRAPE U-SHAPE 47X51 STRL (DRAPES) ×2 IMPLANT
DRAPE X-RAY CASS 24X20 (DRAPES) IMPLANT
DRILL BIT 5/64 (BIT) IMPLANT
DRSG ADAPTIC 3X8 NADH LF (GAUZE/BANDAGES/DRESSINGS) ×2 IMPLANT
DRSG PAD ABDOMINAL 8X10 ST (GAUZE/BANDAGES/DRESSINGS) ×2 IMPLANT
DURAPREP 26ML APPLICATOR (WOUND CARE) ×2 IMPLANT
ELECT BLADE 4.0 EZ CLEAN MEGAD (MISCELLANEOUS) ×2
ELECT NEEDLE TIP 2.8 STRL (NEEDLE) ×2 IMPLANT
ELECT REM PT RETURN 9FT ADLT (ELECTROSURGICAL) ×2
ELECTRODE BLDE 4.0 EZ CLN MEGD (MISCELLANEOUS) ×1 IMPLANT
ELECTRODE REM PT RTRN 9FT ADLT (ELECTROSURGICAL) ×1 IMPLANT
GAUZE SPONGE 4X4 12PLY STRL (GAUZE/BANDAGES/DRESSINGS) ×2 IMPLANT
GLOVE BIOGEL PI ORTHO PRO 7.5 (GLOVE) ×2
GLOVE BIOGEL PI ORTHO PRO SZ8 (GLOVE) ×1
GLOVE ORTHO TXT STRL SZ7.5 (GLOVE) ×2 IMPLANT
GLOVE PI ORTHO PRO STRL 7.5 (GLOVE) ×2 IMPLANT
GLOVE PI ORTHO PRO STRL SZ8 (GLOVE) ×1 IMPLANT
GLOVE SURG ORTHO 8.5 STRL (GLOVE) ×2 IMPLANT
GOWN STRL REUS W/ TWL LRG LVL3 (GOWN DISPOSABLE) ×1 IMPLANT
GOWN STRL REUS W/ TWL XL LVL3 (GOWN DISPOSABLE) ×4 IMPLANT
GOWN STRL REUS W/TWL LRG LVL3 (GOWN DISPOSABLE) ×1
GOWN STRL REUS W/TWL XL LVL3 (GOWN DISPOSABLE) ×4
HANDPIECE INTERPULSE COAX TIP (DISPOSABLE)
HUMERAL HEAD BIOMOD 48MMX4MM (Orthopedic Implant) ×2 IMPLANT
KIT BASIN OR (CUSTOM PROCEDURE TRAY) ×2 IMPLANT
KIT ROOM TURNOVER OR (KITS) ×2 IMPLANT
MANIFOLD NEPTUNE II (INSTRUMENTS) ×2 IMPLANT
NDL SUT 6 .5 CRC .975X.05 MAYO (NEEDLE) ×1 IMPLANT
NEEDLE 1/2 CIR MAYO (NEEDLE) ×2 IMPLANT
NEEDLE HYPO 25GX1X1/2 BEV (NEEDLE) ×2 IMPLANT
NEEDLE MAYO TAPER (NEEDLE) ×1
NS IRRIG 1000ML POUR BTL (IV SOLUTION) ×2 IMPLANT
PACK SHOULDER (CUSTOM PROCEDURE TRAY) ×2 IMPLANT
PAD ARMBOARD 7.5X6 YLW CONV (MISCELLANEOUS) ×4 IMPLANT
SET HNDPC FAN SPRY TIP SCT (DISPOSABLE) IMPLANT
SLING ARM IMMOBILIZER LRG (SOFTGOODS) ×2 IMPLANT
SLING ARM IMMOBILIZER MED (SOFTGOODS) IMPLANT
SPONGE LAP 18X18 X RAY DECT (DISPOSABLE) ×2 IMPLANT
SPONGE LAP 4X18 X RAY DECT (DISPOSABLE) ×2 IMPLANT
STRIP CLOSURE SKIN 1/2X4 (GAUZE/BANDAGES/DRESSINGS) ×2 IMPLANT
SUCTION FRAZIER HANDLE 10FR (MISCELLANEOUS) ×1
SUCTION TUBE FRAZIER 10FR DISP (MISCELLANEOUS) ×1 IMPLANT
SUT FIBERWIRE #2 38 T-5 BLUE (SUTURE) ×12
SUT MNCRL AB 4-0 PS2 18 (SUTURE) ×2 IMPLANT
SUT VIC AB 0 CT1 27 (SUTURE) ×1
SUT VIC AB 0 CT1 27XBRD ANBCTR (SUTURE) ×1 IMPLANT
SUT VIC AB 2-0 CT1 27 (SUTURE) ×1
SUT VIC AB 2-0 CT1 TAPERPNT 27 (SUTURE) ×1 IMPLANT
SUT VICRYL AB 2 0 TIES (SUTURE) ×2 IMPLANT
SUTURE FIBERWR #2 38 T-5 BLUE (SUTURE) ×6 IMPLANT
SYR CONTROL 10ML LL (SYRINGE) ×2 IMPLANT
SYR TOOMEY 50ML (SYRINGE) ×2 IMPLANT
TAPE CLOTH SURG 6X10 WHT LF (GAUZE/BANDAGES/DRESSINGS) ×2 IMPLANT
TOWEL OR 17X24 6PK STRL BLUE (TOWEL DISPOSABLE) ×2 IMPLANT
TOWEL OR 17X26 10 PK STRL BLUE (TOWEL DISPOSABLE) ×2 IMPLANT
TOWER CARTRIDGE SMART MIX (DISPOSABLE) IMPLANT
TRAY FOLEY CATH 16FRSI W/METER (SET/KITS/TRAYS/PACK) ×2 IMPLANT
WATER STERILE IRR 1000ML POUR (IV SOLUTION) ×2 IMPLANT
YANKAUER SUCT BULB TIP NO VENT (SUCTIONS) IMPLANT

## 2015-12-29 NOTE — Anesthesia Procedure Notes (Addendum)
Anesthesia Regional Block:  Interscalene brachial plexus block  Pre-Anesthetic Checklist: ,, timeout performed, Correct Patient, Correct Site, Correct Laterality, Correct Procedure, Correct Position, site marked, Risks and benefits discussed,  Surgical consent,  Pre-op evaluation,  At surgeon's request and post-op pain management  Laterality: Left  Prep: chloraprep       Needles:   Needle Type: Echogenic Stimulator Needle     Needle Length: 9cm 9 cm Needle Gauge: 22 and 22 G    Additional Needles:  Procedures: ultrasound guided (picture in chart) Interscalene brachial plexus block Narrative:  Start time: 12/29/2015 1:26 PM End time: 01/19/2016 1:36 PM Injection made incrementally with aspirations every 5 mL. Anesthesiologist: Cristela Blue  Additional Notes: .5% Marcaine 25cc

## 2015-12-29 NOTE — Transfer of Care (Signed)
Immediate Anesthesia Transfer of Care Note  Patient: [name]  Procedure(s) Performed: Procedure(s): TOTAL SHOULDER REVISION ARHTROPLASTY (Left)  Patient Location: PACU  Anesthesia Type:GA combined with regional for post-op pain  Level of Consciousness: awake, alert  and oriented  Airway & Oxygen Therapy: Patient Spontanous Breathing and Patient connected to nasal cannula oxygen  Post-op Assessment: Report given to RN, Post -op Vital signs reviewed and stable and Patient moving all extremities  Post vital signs: Reviewed and stable  Last Vitals: There were no vitals filed for this visit.  Last Pain: There were no vitals filed for this visit.       Complications: No apparent anesthesia complications

## 2015-12-29 NOTE — Discharge Instructions (Signed)
Ice to the shoulder.  Do NOT push up out of the chair with the left arm - minimal weight bearing.  Use the arm for daily activity as you can.  Sling as needed.  May remove sling in the house but wear when going out.   Follow up in the office in two weeks.  Call for appointment [phone]

## 2015-12-29 NOTE — Brief Op Note (Signed)
12/29/2015  5:22 PM  PATIENT:  [name]  75 y.o. male  PRE-OPERATIVE DIAGNOSIS:  LEFT SHOULDER PAIN, s/p TSA, Loose Glenoid Component  POST-OPERATIVE DIAGNOSIS:  LEFT SHOULDER PAIN, s/p TSA, Loose Glenoid Component  PROCEDURE:  Procedure(s): TOTAL SHOULDER REVISION ARHTROPLASTY (Left) - Removal of Loose Glenoid component and bone grafting of large cavitary defect in Glenoid:  Biomet Comprehensive system  SURGEON:  Surgeon(s) and Role:    * Beverely Low, MD - Primary  PHYSICIAN ASSISTANT:   ASSISTANTS: Thea Gist, PA-C   ANESTHESIA:   regional and general  EBL:  Total I/O In: 600 [I.V.:600] Out: 150 [Blood:150]  BLOOD ADMINISTERED:none  DRAINS: none   LOCAL MEDICATIONS USED:  MARCAINE     SPECIMEN:  Source of Specimen:  tissue from left shoulder joint replacement  DISPOSITION OF SPECIMEN:  micro  COUNTS:  YES  TOURNIQUET:  * No tourniquets in log *  DICTATION: .Other Dictation: Dictation Number [id number]  PLAN OF CARE: Admit to inpatient   PATIENT DISPOSITION:  PACU - hemodynamically stable.   Delay start of Pharmacological VTE agent (>24hrs) due to surgical blood loss or risk of bleeding: not applicable

## 2015-12-29 NOTE — Anesthesia Preprocedure Evaluation (Addendum)
Anesthesia Evaluation  Patient identified by MRN, date of birth, ID band Patient awake    Reviewed: Allergy & Precautions, H&P , Patient's Chart, lab work & pertinent test results, reviewed documented beta blocker date and time   Airway Mallampati: II  TM Distance: >3 FB Neck ROM: full    Dental no notable dental hx.    Pulmonary sleep apnea , former smoker,    Pulmonary exam normal breath sounds clear to auscultation       Cardiovascular hypertension, + CAD, + Past MI and +CHF  Atrial Fibrillation  Rhythm:regular Rate:Normal     Neuro/Psych    GI/Hepatic   Endo/Other  diabetes, Type 2  Renal/GU      Musculoskeletal   Abdominal   Peds  Hematology   Anesthesia Other Findings   Reproductive/Obstetrics                             Anesthesia Physical Anesthesia Plan  ASA: III  Anesthesia Plan: General   Post-op Pain Management: GA combined w/ Regional for post-op pain   Induction: Intravenous  Airway Management Planned: Oral ETT  Additional Equipment:   Intra-op Plan:   Post-operative Plan: Extubation in OR  Informed Consent: I have reviewed the patients History and Physical, chart, labs and discussed the procedure including the risks, benefits and alternatives for the proposed anesthesia with the patient or authorized representative who has indicated his/her understanding and acceptance.   Dental Advisory Given and Dental advisory given  Plan Discussed with: CRNA and Surgeon  Anesthesia Plan Comments: (  Discussed general anesthesia, including possible nausea, instrumentation of airway, sore throat,pulmonary aspiration, etc. I asked if the were any outstanding questions, or  concerns before we proceeded. )        Anesthesia Quick Evaluation

## 2015-12-29 NOTE — Anesthesia Procedure Notes (Signed)
Procedure Name: Intubation Date/Time: 12/29/2015 2:55 PM Performed by: Sharlene Dory E Pre-anesthesia Checklist: Patient identified, Emergency Drugs available, Suction available, Patient being monitored and Timeout performed Patient Re-evaluated:Patient Re-evaluated prior to inductionOxygen Delivery Method: Circle system utilized Preoxygenation: Pre-oxygenation with 100% oxygen Intubation Type: IV induction and Cricoid Pressure applied Ventilation: Mask ventilation without difficulty Laryngoscope Size: Mac and 4 Grade View: Grade I Tube type: Oral Tube size: 7.5 mm Number of attempts: 1 Airway Equipment and Method: Stylet Placement Confirmation: ETT inserted through vocal cords under direct vision,  positive ETCO2 and breath sounds checked- equal and bilateral Secured at: 21 cm Tube secured with: Tape Dental Injury: Teeth and Oropharynx as per pre-operative assessment

## 2015-12-29 NOTE — Anesthesia Postprocedure Evaluation (Signed)
Anesthesia Post Note  Patient: [name]  Procedure(s) Performed: Procedure(s) (LRB): TOTAL SHOULDER REVISION ARHTROPLASTY (Left)  Patient location during evaluation: PACU Anesthesia Type: General and Regional Level of consciousness: awake, awake and alert and oriented Pain management: pain level controlled Vital Signs Assessment: post-procedure vital signs reviewed and stable Respiratory status: spontaneous breathing, nonlabored ventilation and respiratory function stable Cardiovascular status: blood pressure returned to baseline Anesthetic complications: no    Last Vitals:  Vitals:   12/29/15 2015 12/29/15 2031  BP: (!) 142/63 (!) 143/60  Pulse: 68 70  Resp: (!) 23 20  Temp:  36.8 C    Last Pain:  Vitals:   12/29/15 2031  TempSrc: Oral  PainSc: 2                  Reeta Kuk COKER

## 2015-12-30 ENCOUNTER — Encounter (HOSPITAL_COMMUNITY): Payer: Self-pay | Admitting: Orthopedic Surgery

## 2015-12-30 LAB — BASIC METABOLIC PANEL
Anion gap: 5 (ref 5–15)
BUN: 17 mg/dL (ref 6–20)
CHLORIDE: 103 mmol/L (ref 101–111)
CO2: 26 mmol/L (ref 22–32)
Calcium: 7.9 mg/dL — ABNORMAL LOW (ref 8.9–10.3)
Creatinine, Ser: 1 mg/dL (ref 0.61–1.24)
GFR calc Af Amer: >60 mL/min (ref >60)
GLUCOSE: 127 mg/dL — AB (ref 65–99)
POTASSIUM: 3.1 mmol/L — AB (ref 3.5–5.1)
Sodium: 134 mmol/L — ABNORMAL LOW (ref 135–145)

## 2015-12-30 LAB — HEMOGLOBIN AND HEMATOCRIT, BLOOD
HEMATOCRIT: 29.7 % — AB (ref 39.0–52.0)
Hemoglobin: 10.3 g/dL — ABNORMAL LOW (ref 13.0–17.0)

## 2015-12-30 LAB — GLUCOSE, CAPILLARY: Glucose-Capillary: 139 mg/dL — ABNORMAL HIGH (ref 65–99)

## 2015-12-30 LAB — HEMOGLOBIN A1C
HEMOGLOBIN A1C: 7 % — AB (ref 4.8–5.6)
Mean Plasma Glucose: 154 mg/dL

## 2015-12-30 NOTE — Progress Notes (Signed)
PT Cancellation Note and D/C Note  Patient Details Name: ANDY ALLENDE MRN: [id number] DOB: 07-26-1940   Cancelled Treatment:    Reason Eval/Treat Not Completed: PT screened, no needs identified, will sign off (Pt ambulates independently.  No PT needs. Pt/Wife agree.)Will sign off.  Thanks.  Amadeo Garnet Raigen Jagielski 12/30/2015, 10:51 AM  [facility] [phone] (pager)

## 2015-12-30 NOTE — Progress Notes (Signed)
Orthopedic Tech Progress Note Patient Details:  SCOTLAND KORVER [birth year] [id number]  Ortho Devices Type of Ortho Device: Arm sling Ortho Device/Splint Location: lue Ortho Device/Splint Interventions: Application   Nikki Dom 12/30/2015, 10:25 AM Viewed order from doctor's order list

## 2015-12-30 NOTE — Progress Notes (Signed)
Orthopedics Progress Note  Subjective: Patient feeling better this morning  Objective:  Vitals:   12/29/15 2031 12/30/15 0540  BP: (!) 143/60 (!) 159/76  Pulse: 70 68  Resp: 20 20  Temp: 98.3 F (36.8 C) 97.9 F (36.6 C)    General: Awake and alert  Musculoskeletal: left shoulder dressing CDI, NVI Neurovascularly intact  Lab Results  Component Value Date   WBC 9.8 12/27/2015   HGB 10.3 (L) 12/30/2015   HCT 29.7 (L) 12/30/2015   MCV 93.2 12/27/2015   PLT 282 12/27/2015       Component Value Date/Time   NA 135 12/27/2015 1509   K 3.5 12/27/2015 1509   CL 99 (L) 12/27/2015 1509   CO2 25 12/27/2015 1509   GLUCOSE 264 (H) 12/27/2015 1509   BUN 24 (H) 12/27/2015 1509   CREATININE 1.39 (H) 12/27/2015 1509   CREATININE 1.35 12/23/2013 0837   CALCIUM 9.3 12/27/2015 1509   GFRNONAA 48 (L) 12/27/2015 1509   GFRAA 56 (L) 12/27/2015 1509    Lab Results  Component Value Date   INR 1.51 12/29/2015   INR 1.43 12/27/2015   INR 1.4 02/23/2014    Assessment/Plan: POD #1 s/p Procedure(s): TOTAL SHOULDER REVISION ARHTROPLASTY Patient recovering nicely.  Will plan discharge to home if doing well after therapy - pain well controlled, and able to assist with transfers and ADLs  Almedia Balls. Ranell Patrick, MD 12/30/2015 7:47 AM

## 2015-12-30 NOTE — Op Note (Signed)
[name], [name]              ACCOUNT NO.:  [id number]  MEDICAL RECORD NO.:  [id number]  LOCATION:  6N26C                        FACILITY:  [facility]  PHYSICIAN:  Almedia Balls. Ranell Patrick, M.D. DATE OF BIRTH:  02/03/1941  DATE OF PROCEDURE:  12/29/2015 DATE OF DISCHARGE:                              OPERATIVE REPORT   PREOPERATIVE DIAGNOSIS:  Left shoulder pain secondary to loose glenoid component, total shoulder arthroplasty.  POSTOPERATIVE DIAGNOSIS:  Left shoulder pain secondary to loose glenoid component, total shoulder arthroplasty.  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  ATTENDING SURGEON:  Almedia Balls. Ranell Patrick, M.D.  ASSISTANT:  Donnie Coffin. Dixon, PA-C, who scrubbed the entire procedure and necessary for satisfactory completion of surgery.  ANESTHESIA:  General anesthesia was used plus interscalene block.  ESTIMATED BLOOD LOSS:  150 mL.  FLUID REPLACEMENT:  1500 mL crystalloid.  INSTRUMENT COUNTS:  Correct.  COMPLICATIONS:  None.  ANTIBIOTICS:  Perioperative antibiotics were given.  INDICATIONS:  The patient is a 74 year old male with worsening shoulder pain secondary to a shoulder replacement that was done years ago by Dr. Hilma Favors.  The patient has had progressive pain recently.  Workup including x-rays and CT scan indicating loose glenoid component with cavitary defect in the glenoid.  We discussed options for management recommending removal of the loose component and potential bone grafting of the glenoid and conversion to hemiarthroplasty.  Risks and benefits of surgery discussed in detail with the patient.  Informed consent obtained.  DESCRIPTION OF PROCEDURE:  After an adequate level of anesthesia achieved, the patient was positioned in modified beach-chair position. The left shoulder was correctly identified,  sterilely prepped and draped in usual manner, time-out called.  We initiated shoulder surgery using a deltopectoral incision started at the coracoid process extending down to the anterior humerus.  Dissection down through subcutaneous tissues. The deltopectoral interval identified and developed.  Bovie electrocautery was used to control hemostasis.  Went ahead and placed deeper retractors.  There was some scar tissue in this deltopectoral plane, which we removed using Cobb elevator and a Bovie.  We identified the conjoint tendon, retracted that medially.  The subscapularis was intact.  The rotator cuff was intact.  We did free up scar tissue in the subdeltoid subacromial plane in the subcoracoid plane.  We then went ahead and released the subscapularis subperiosteally off the lesser tuberosity and tagged with #2 FiberWire suture for repair.  We did a peel of the capsule and the subscap was a single structure of the humerus all the way down the humeral neck and did a release of the capsule all way down to the posterior position.  We then went ahead and removed the Biomet comprehensive head.  There was a __________ head that we removed with a tuning fork and medially encountered some whitish tissue underneath the head __________ common on the shoulder __________ sent some of that for pathology and microbiology to evaluate for any potential infection that was sent in a sterile container to Microbiology.  We next went ahead and removed all the tissue with a rongeur.  We got back to good bleeding tissue.  There was no purulence. No evidence for infection whatsoever.  The glenoid was completely loose. We removed quite a bit of synovitis throughout the shoulder joint.  We removed the glenoid, which had rotated 90 degrees and the inferior portion pointed anteriorly.  The keel had sheared off.  We did remove cement, keel, and scar tissue from the large glenoid cavitary defect. Some of the  synovitis was removed from the shoulder joint and some hypertrophied capsule were careful to protect the axillary nerve, which we were able to palpate, this projected during the surgery.  Once we had that defect defined and back to good bleeding bone, we felt like it was fairly well contained.  It was not perfect, but there was enough bone around it.  We felt like we could bone graft that successfully, so we took a 20 mL of cancellous allograft chips and impacted that into position.  Using impaction grafting technique to build back up the glenoid face where it was a concave face much like the native glenoid. Once we had that built up with bone graft, we selected a __________ humeral head component and impacted that in position with that the eccentricity dialed posteriorly and superiorly for best coverage of the bone.  We irrigated thoroughly prior to that.  We then reduced the shoulder, felt like we had nice stable shoulder joint.  Then, we repaired the subscapularis anatomically back through drill holes in the bone and also with rotator interval closure and reinforcement into the biceps.  We did a biceps tenodesis as well as the biceps had been preserved in for surgery.  We did go and release that and at this time, we irrigated thoroughly.  Closed the subcutaneous closure with #2-0 Vicryl and #4-0 Monocryl for skin.  Steri-Strips applied followed by sterile dressing.  The patient tolerated the surgery well.     Almedia Balls. Ranell Patrick, M.D.     SRN/MEDQ  D:  12/29/2015  T:  12/30/2015  Job:  [id number]

## 2015-12-30 NOTE — Evaluation (Signed)
Occupational Therapy Evaluation and Discharge Patient Details Name: [name] MRN: [id number] DOB: March 26, 1940 Today's Date: 12/30/2015    History of Present Illness 75 y/o male s/p left total shoulder revision arthroplasty. Pt has a past medical history of Arthritis; Atrial fibrillation; Barrett's esophagus; Chronic kidney disease; Coronary artery disease; Diabetes; Dyspnea; GERD; History of kidney stones; Hyperlipidemia; Hypertension; Myocardial infarction; Pre-syncope; and Sleep apnea. Pt  has a past surgical history that includes Coronary angioplasty with stent; Coronary artery bypass graft; Coronary stent placement (07/30/2013); Hernia repair (Left); Shoulder surgery (Bilateral); Kidney stone surgery; Cardioversion (N/A, 12/29/2013); left heart catheterization with coronary/graft angiogram (N/A, 07/30/2013); percutaneous coronary stent intervention (pci-s) (07/30/2013); Percutaneous coronary intervention-balloon only (07/30/2013); Lithotripsy; Nephrolithotomy; Colonoscopy w/ polypectomy; and Upper gi endoscopy.   Clinical Impression   PTA Pt had assist with ADL, independent in mobility (although he has DME from previous hospitalizations). Pt currently mod assist for ADL, and min guard for ambulation for safety. Shoulder d/c handout reviewed in full, all exercises practiced and Pt/wife with no further questions or concerns. Pt to progress rehab at shoulder as determined by MD at follow up.     Follow Up Recommendations  Supervision/Assistance - 24 hour;Other (comment) (progress shoulder as determined by MD at follow up)    Equipment Recommendations  None recommended by OT    Recommendations for Other Services       Precautions / Restrictions Precautions Precautions: Shoulder Type of Shoulder Precautions: Active protocol - FF 0-90; Abduction 0-60; ER 0-30; DO NOT push out of chair with significant weight on L arm Shoulder Interventions: Shoulder sling/immobilizer;For  comfort Precaution Booklet Issued: Yes (comment) Precaution Comments: shoulder d/c handout provided (and exercises) reviewed in full Required Braces or Orthoses: Sling (for comfort and sleep) Restrictions Weight Bearing Restrictions: Yes LUE Weight Bearing: Non weight bearing      Mobility Bed Mobility Overal bed mobility: Needs Assistance Bed Mobility: Rolling;Sidelying to Sit Rolling: Supervision Sidelying to sit: Supervision       General bed mobility comments: verbal cues for sequencing, no assist needed  Transfers Overall transfer level: Needs assistance Equipment used: None Transfers: Sit to/from Stand Sit to Stand: Min guard         General transfer comment: min guard for safety    Balance Overall balance assessment: Needs assistance Sitting-balance support: No upper extremity supported;Feet supported Sitting balance-Leahy Scale: Good Sitting balance - Comments: sitting EOB with no support   Standing balance support: No upper extremity supported;During functional activity Standing balance-Leahy Scale: Fair Standing balance comment: min guard for safety, no LOB                            ADL Overall ADL's : Needs assistance/impaired Eating/Feeding: Minimal assistance;With caregiver independent assisting;Sitting Eating/Feeding Details (indicate cue type and reason): to cut up food Grooming: Wash/dry hands;Wash/dry face;Min guard;Standing Grooming Details (indicate cue type and reason): min guard for safety Upper Body Bathing: Minimal assitance;With caregiver independent assisting;Sitting Upper Body Bathing Details (indicate cue type and reason): Wife has been assisting the past few weeks, educated in technique for getting under arm Lower Body Bathing: Min guard;Sitting/lateral leans Lower Body Bathing Details (indicate cue type and reason): Pt has shower chair that they plan on using Upper Body Dressing : Moderate assistance;With caregiver  independent assisting;Sitting;Cueing for sequencing Upper Body Dressing Details (indicate cue type and reason): Wife donned/doff brace without problem Lower Body Dressing: Moderate assistance;With caregiver independent assisting;Sit to/from stand Lower Body  Dressing Details (indicate cue type and reason): Pt and wife donned underwear and pants Toilet Transfer: Min guard;Comfort height toilet;Grab bars Toilet Transfer Details (indicate cue type and reason): Due to use of grab bar, educated Pt and wife to use BSC over toilet for safety Toileting- Clothing Manipulation and Hygiene: Moderate assistance;With caregiver independent assisting;Sit to/from stand   Tub/ Shower Transfer: Walk-in shower;Min guard;With caregiver independent assisting;Ambulation;Shower Field seismologist Details (indicate cue type and reason): simulated through entry into bathroom Functional mobility during ADLs: Min guard General ADL Comments: Pt required assistance with bilateral fine motor tasks PTA, and ADL PTA     Vision     Perception     Praxis      Pertinent Vitals/Pain Pain Assessment: 0-10 Pain Score: 6  Pain Location: L shoulder Pain Descriptors / Indicators: Discomfort;Dull;Grimacing;Sore Pain Intervention(s): Limited activity within patient's tolerance;Monitored during session;Repositioned;Ice applied;Premedicated before session     Hand Dominance Left   Extremity/Trunk Assessment Upper Extremity Assessment Upper Extremity Assessment: LUE deficits/detail LUE Deficits / Details: post op decreased in ROM and strength    Lower Extremity Assessment Lower Extremity Assessment: Overall WFL for tasks assessed   Cervical / Trunk Assessment Cervical / Trunk Assessment: Normal   Communication Communication Communication: No difficulties   Cognition Arousal/Alertness: Awake/alert Behavior During Therapy: WFL for tasks assessed/performed Overall Cognitive Status: Within Functional Limits for  tasks assessed                     General Comments       Exercises Exercises: Shoulder     Shoulder Instructions Shoulder Instructions Donning/doffing shirt without moving shoulder: Maximal assistance;Caregiver independent with task Method for sponge bathing under operated UE: Moderate assistance;Caregiver independent with task;Patient able to independently direct caregiver Donning/doffing sling/immobilizer: Maximal assistance;Caregiver independent with task;Patient able to independently direct caregiver Correct positioning of sling/immobilizer: Caregiver independent with task;Supervision/safety ROM for elbow, wrist and digits of operated UE: Modified independent Sling wearing schedule (on at all times/off for ADL's): Modified independent Proper positioning of operated UE when showering: Supervision/safety;Caregiver independent with task;Patient able to independently direct caregiver Positioning of UE while sleeping: Set-up;Caregiver independent with task;Patient able to independently direct caregiver    Home Living Family/patient expects to be discharged to:: Private residence Living Arrangements: Spouse/significant other;Children (son who is 28) Available Help at Discharge: Family;Available 24 hours/day Type of Home: House Home Access: Stairs to enter [employer] of Steps: 1 Entrance Stairs-Rails: None Home Layout: One level     Bathroom Shower/Tub: Producer, television/film/video: Handicapped height Bathroom Accessibility: Yes How Accessible: Accessible via walker Home Equipment: Walker - 2 wheels;Cane - single point;Bedside commode;Shower seat;Hand held shower head          Prior Functioning/Environment Level of Independence: Needs assistance  Gait / Transfers Assistance Needed: no assist ADL's / Homemaking Assistance Needed: assist bathing and dressing            OT Problem List: Decreased strength;Decreased range of motion;Decreased  activity tolerance;Decreased safety awareness;Decreased knowledge of use of DME or AE;Decreased knowledge of precautions;Impaired UE functional use;Pain   OT Treatment/Interventions:      OT Goals(Current goals can be found in the care plan section) Acute Rehab OT Goals Patient Stated Goal: To be pain free OT Goal Formulation: With patient/family Time For Goal Achievement: 01/06/16 Potential to Achieve Goals: Good  OT Frequency:     Barriers to D/C:            Co-evaluation  End of Session Nurse Communication: Mobility status;Other (comment) (called ortho tech for bigger sling (had L needed XL))  Activity Tolerance: Patient tolerated treatment well Patient left: in chair;with call bell/phone within reach;with family/visitor present   Time: 2706-2376 OT Time Calculation (min): 64 min Charges:  OT General Charges $OT Visit: 1 Procedure OT Evaluation $OT Eval Moderate Complexity: 1 Procedure OT Treatments $Self Care/Home Management : 23-37 mins $Therapeutic Exercise: 8-22 mins G-Codes:    Evern Bio Delisa Finck 01-27-16, 11:00 AM  Sherryl Manges OTR/L [phone]

## 2015-12-30 NOTE — Consult Note (Signed)
            [facility] CM Primary Care Navigator  12/30/2015  [name] 01/27/1941 [id number]   Wentto see patient in room to identify possible discharge needs butnursing staff states patient was just discharged.  Primary care provider's office contacted (Bonnie)to notify of patient's discharge and need for post hospital follow-up and transition of care. Made aware to refer patient to [facility] care management for care coordination needs if deemed appropriate.  For additional questions please contact:  Karin Golden A. Leemon Ayala, BSN, RN-BC [facility] PRIMARY CARE Navigator Cell: [phone]

## 2015-12-30 NOTE — Progress Notes (Signed)
Discharge home. Home discharge instruction given, no question verbalized. 

## 2015-12-30 NOTE — Discharge Summary (Signed)
Physician Discharge Summary   Patient ID: [name] MRN: [id number] DOB/AGE: 1940/10/25 75 y.o.  Admit date: 12/29/2015 Discharge date: 12/30/2015  Admission Diagnoses:  Active Problems:   S/P shoulder replacement, left   Discharge Diagnoses:  Same   Surgeries: Procedure(s): TOTAL SHOULDER REVISION ARHTROPLASTY on 12/29/2015   Consultants: OT  Discharged Condition: Stable  Hospital Course: [name] is an 75 y.o. male who was admitted 12/29/2015 with a chief complaint of left shoulder pain, and found to have a diagnosis of left shoulder failed TSA.  They were brought to the operating room on 12/29/2015 and underwent the above named procedures.    The patient had an uncomplicated hospital course and was stable for discharge.  Recent vital signs:  Vitals:   12/29/15 2031 12/30/15 0540  BP: (!) 143/60 (!) 159/76  Pulse: 70 68  Resp: 20 20  Temp: 98.3 F (36.8 C) 97.9 F (36.6 C)    Recent laboratory studies:  Results for orders placed or performed during the hospital encounter of 12/29/15  Aerobic/Anaerobic Culture (surgical/deep wound)  Result Value Ref Range   Specimen Description TISSUE LEFT SHOULDER    Special Requests REPLACEMENT EXPLANTS    Gram Stain      RARE WBC PRESENT,BOTH PMN AND MONONUCLEAR RARE GRAM NEGATIVE RODS Gram Stain Report Called to,Read Back By and Verified With: S. SIMMONS, OR RN AT 1655 ON 12/29/15 BY C. JESSUP, MLT.    Culture PENDING    Report Status PENDING   Protime-INR  Result Value Ref Range   Prothrombin Time 18.3 (H) 11.4 - 15.2 seconds   INR 1.51   Hemoglobin A1c  Result Value Ref Range   Hgb A1c MFr Bld 7.0 (H) 4.8 - 5.6 %   Mean Plasma Glucose 154 mg/dL  Glucose, capillary  Result Value Ref Range   Glucose-Capillary 158 (H) 65 - 99 mg/dL  Glucose, capillary  Result Value Ref Range   Glucose-Capillary 130 (H) 65 - 99 mg/dL  Hemoglobin and hematocrit, blood  Result Value Ref Range   Hemoglobin 10.3 (L)  13.0 - 17.0 g/dL   HCT 95.1 (L) 88.4 - 16.6 %  Glucose, capillary  Result Value Ref Range   Glucose-Capillary 151 (H) 65 - 99 mg/dL   Comment 1 Notify RN     Discharge Medications:     Medication List    TAKE these medications   carvedilol 25 MG tablet Commonly known as:  COREG Take 25 mg by mouth 2 (two) times daily with a meal.   chlorthalidone 25 MG tablet Commonly known as:  HYGROTON Take 25 mg by mouth daily.   digoxin 0.125 MG tablet Commonly known as:  LANOXIN Take 1 tablet (0.125 mg total) by mouth daily.   diltiazem 120 MG 24 hr capsule Commonly known as:  CARDIZEM CD Take 1 capsule (120 mg total) by mouth 2 (two) times daily before a meal. What changed:  when to take this   furosemide 20 MG tablet Commonly known as:  LASIX Take 1 tablet as needed for swelling What changed:  how much to take  how to take this  when to take this  reasons to take this  additional instructions   glimepiride 1 MG tablet Commonly known as:  AMARYL Take 2 mg by mouth daily.   HYDROcodone-acetaminophen 5-325 MG tablet Commonly known as:  NORCO Take 1 tablet by mouth every 6 (six) hours as needed for moderate pain.   nitroGLYCERIN 0.4 MG SL tablet Commonly known  as:  NITROSTAT Place 0.4 mg under the tongue every 5 (five) minutes as needed for chest pain.   pantoprazole 40 MG tablet Commonly known as:  PROTONIX Take 40 mg by mouth 2 (two) times daily before a meal.   senna 8.6 MG tablet Commonly known as:  SENOKOT Take 2 tablets by mouth as needed for constipation.   simvastatin 40 MG tablet Commonly known as:  ZOCOR Take 20 mg by mouth daily at 6 PM.   traMADol 50 MG tablet Commonly known as:  ULTRAM Take 50 mg by mouth 2 (two) times daily.   valsartan 80 MG tablet Commonly known as:  DIOVAN Take 80 mg by mouth every evening.   warfarin 5 MG tablet Commonly known as:  COUMADIN Take 1 tablet (5 mg total) by mouth daily. What changed:  when to take  this  additional instructions       Diagnostic Studies: Ct Shoulder Left Wo Contrast  Result Date: 12/06/2015 CLINICAL DATA:  Left shoulder replacement. EXAM: CT OF THE LEFT SHOULDER WITHOUT CONTRAST; 3-DIMENSIONAL CT IMAGE RENDERING ON INDEPENDENT WORKSTATION TECHNIQUE: Multidetector CT imaging was performed according to the standard protocol. Multiplanar CT image reconstructions were also generated. 3-dimensional CT images were rendered by post-processing of the original CT data on an independent workstation. The 3-dimensional CT images were interpreted and findings were reported in the accompanying complete CT report for this study COMPARISON:  None. FINDINGS: Bones/Joint/Cartilage Left total reverse shoulder arthroplasty. No acute fracture or dislocation. 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 No fracture or dislocation. Normal alignment. No joint effusion. No periarticular fluid collection. Ligaments Ligaments are suboptimally evaluated by CT. Muscles and Tendons Muscles are normal.  No muscle atrophy. Soft tissue No fluid collection or hematoma. No soft tissue mass. Visualized left lung is clear. IMPRESSION: Left total reverse shoulder arthroplasty. No acute fracture or dislocation. 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 Electronically Signed   By: Elige Ko   On: 12/06/2015 11:03   Ct 3d Independent Annabell Sabal  Result Date: 12/06/2015 CLINICAL DATA:  Left shoulder replacement. EXAM: CT OF THE LEFT SHOULDER WITHOUT CONTRAST; 3-DIMENSIONAL CT IMAGE RENDERING ON INDEPENDENT WORKSTATION TECHNIQUE: Multidetector CT imaging was performed according to the standard protocol. Multiplanar CT image reconstructions were also generated. 3-dimensional CT images were rendered by post-processing of the original CT data on an independent workstation. The 3-dimensional CT images were  interpreted and findings were reported in the accompanying complete CT report for this study COMPARISON:  None. FINDINGS: Bones/Joint/Cartilage Left total reverse shoulder arthroplasty. No acute fracture or dislocation. 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 No fracture or dislocation. Normal alignment. No joint effusion. No periarticular fluid collection. Ligaments Ligaments are suboptimally evaluated by CT. Muscles and Tendons Muscles are normal.  No muscle atrophy. Soft tissue No fluid collection or hematoma. No soft tissue mass. Visualized left lung is clear. IMPRESSION: Left total reverse shoulder arthroplasty. No acute fracture or dislocation. 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 Electronically Signed   By: Elige Ko   On: 12/06/2015 11:03   Dg Shoulder Left Port  Result Date: 12/29/2015 CLINICAL DATA:  Postoperative evaluation LEFT shoulder replacement. EXAM: LEFT SHOULDER - 1 VIEW COMPARISON:  CT LEFT shoulder December 06, 2015 FINDINGS: Status post LEFT shoulder total arthroplasty with  intact well seated humeral prosthesis. No fracture deformity. No dislocation. LEFT shoulder subcutaneous gas consistent with recent surgery. Status post CABG. IMPRESSION: Status post LEFT shoulder arthroplasty with expected postoperative changes. Electronically Signed   By: Awilda Metro M.D.   On: 12/29/2015 19:57    Disposition: 01-Home or Self Care    Follow-up Information    Dujuan Stankowski,STEVEN R, MD. Call in 2 weeks.   Specialty:  Orthopedic Surgery Why:  [phone] Contact information: [address] [place] [address] [phone]            Signed: Verlee Rossetti 12/30/2015, 7:49 AM

## 2015-12-31 DIAGNOSIS — Z471 Aftercare following joint replacement surgery: Secondary | ICD-10-CM | POA: Diagnosis not present

## 2015-12-31 DIAGNOSIS — Z7901 Long term (current) use of anticoagulants: Secondary | ICD-10-CM | POA: Diagnosis not present

## 2015-12-31 DIAGNOSIS — S0093XA Contusion of unspecified part of head, initial encounter: Secondary | ICD-10-CM | POA: Diagnosis not present

## 2015-12-31 DIAGNOSIS — W19XXXA Unspecified fall, initial encounter: Secondary | ICD-10-CM | POA: Diagnosis not present

## 2015-12-31 DIAGNOSIS — Z96612 Presence of left artificial shoulder joint: Secondary | ICD-10-CM | POA: Diagnosis not present

## 2016-01-03 LAB — AEROBIC/ANAEROBIC CULTURE (SURGICAL/DEEP WOUND)

## 2016-01-03 LAB — AEROBIC/ANAEROBIC CULTURE W GRAM STAIN (SURGICAL/DEEP WOUND): Culture: NO GROWTH

## 2016-01-05 DIAGNOSIS — Z7901 Long term (current) use of anticoagulants: Secondary | ICD-10-CM | POA: Diagnosis not present

## 2016-01-10 DIAGNOSIS — I480 Paroxysmal atrial fibrillation: Secondary | ICD-10-CM | POA: Diagnosis not present

## 2016-01-10 DIAGNOSIS — I10 Essential (primary) hypertension: Secondary | ICD-10-CM | POA: Diagnosis not present

## 2016-01-10 DIAGNOSIS — Z5181 Encounter for therapeutic drug level monitoring: Secondary | ICD-10-CM | POA: Diagnosis not present

## 2016-01-11 DIAGNOSIS — Z96612 Presence of left artificial shoulder joint: Secondary | ICD-10-CM | POA: Diagnosis not present

## 2016-01-17 DIAGNOSIS — Z7901 Long term (current) use of anticoagulants: Secondary | ICD-10-CM | POA: Diagnosis not present

## 2016-01-19 NOTE — Addendum Note (Signed)
Addendum  created 01/19/16 1536 by Cristela Blue, MD   Anesthesia Intra Blocks edited, Sign clinical note

## 2016-02-08 DIAGNOSIS — Z96612 Presence of left artificial shoulder joint: Secondary | ICD-10-CM | POA: Diagnosis not present

## 2016-02-17 DIAGNOSIS — Z7901 Long term (current) use of anticoagulants: Secondary | ICD-10-CM | POA: Diagnosis not present

## 2016-02-21 DIAGNOSIS — K2900 Acute gastritis without bleeding: Secondary | ICD-10-CM | POA: Diagnosis not present

## 2016-02-21 DIAGNOSIS — K2270 Barrett's esophagus without dysplasia: Secondary | ICD-10-CM | POA: Diagnosis not present

## 2016-02-24 DIAGNOSIS — K2270 Barrett's esophagus without dysplasia: Secondary | ICD-10-CM | POA: Diagnosis not present

## 2016-03-09 DIAGNOSIS — Z7901 Long term (current) use of anticoagulants: Secondary | ICD-10-CM | POA: Diagnosis not present

## 2016-03-20 DIAGNOSIS — N182 Chronic kidney disease, stage 2 (mild): Secondary | ICD-10-CM | POA: Diagnosis not present

## 2016-03-20 DIAGNOSIS — E7800 Pure hypercholesterolemia, unspecified: Secondary | ICD-10-CM | POA: Diagnosis not present

## 2016-03-20 DIAGNOSIS — K219 Gastro-esophageal reflux disease without esophagitis: Secondary | ICD-10-CM | POA: Diagnosis not present

## 2016-03-20 DIAGNOSIS — Z7984 Long term (current) use of oral hypoglycemic drugs: Secondary | ICD-10-CM | POA: Diagnosis not present

## 2016-03-20 DIAGNOSIS — K2270 Barrett's esophagus without dysplasia: Secondary | ICD-10-CM | POA: Diagnosis not present

## 2016-03-20 DIAGNOSIS — M069 Rheumatoid arthritis, unspecified: Secondary | ICD-10-CM | POA: Diagnosis not present

## 2016-03-20 DIAGNOSIS — I10 Essential (primary) hypertension: Secondary | ICD-10-CM | POA: Diagnosis not present

## 2016-03-20 DIAGNOSIS — E1122 Type 2 diabetes mellitus with diabetic chronic kidney disease: Secondary | ICD-10-CM | POA: Diagnosis not present

## 2016-03-20 DIAGNOSIS — I480 Paroxysmal atrial fibrillation: Secondary | ICD-10-CM | POA: Diagnosis not present

## 2016-03-21 DIAGNOSIS — Z96612 Presence of left artificial shoulder joint: Secondary | ICD-10-CM | POA: Diagnosis not present

## 2016-03-27 DIAGNOSIS — M25612 Stiffness of left shoulder, not elsewhere classified: Secondary | ICD-10-CM | POA: Diagnosis not present

## 2016-03-27 DIAGNOSIS — M25611 Stiffness of right shoulder, not elsewhere classified: Secondary | ICD-10-CM | POA: Diagnosis not present

## 2016-04-06 DIAGNOSIS — Z7901 Long term (current) use of anticoagulants: Secondary | ICD-10-CM | POA: Diagnosis not present

## 2016-05-02 DIAGNOSIS — M1612 Unilateral primary osteoarthritis, left hip: Secondary | ICD-10-CM | POA: Diagnosis not present

## 2016-05-05 DIAGNOSIS — Z7901 Long term (current) use of anticoagulants: Secondary | ICD-10-CM | POA: Diagnosis not present

## 2016-05-05 DIAGNOSIS — M169 Osteoarthritis of hip, unspecified: Secondary | ICD-10-CM | POA: Diagnosis not present

## 2016-05-05 DIAGNOSIS — Z01818 Encounter for other preprocedural examination: Secondary | ICD-10-CM | POA: Diagnosis not present

## 2016-05-09 ENCOUNTER — Other Ambulatory Visit: Payer: Self-pay | Admitting: Orthopedic Surgery

## 2016-05-09 ENCOUNTER — Ambulatory Visit: Payer: Self-pay | Admitting: Orthopedic Surgery

## 2016-05-11 ENCOUNTER — Ambulatory Visit: Payer: Self-pay | Admitting: Orthopedic Surgery

## 2016-05-11 NOTE — H&P (Signed)
TOTAL HIP ADMISSION H&P  Patient is admitted for left total hip arthroplasty.  Subjective:  Chief Complaint: left hip pain  HPI: [name], 76 y.o. male, has a history of pain and functional disability in the left hip(s) due to arthritis and patient has failed non-surgical conservative treatments for greater than 12 weeks to include NSAID's and/or analgesics, flexibility and strengthening excercises, use of assistive devices, weight reduction as appropriate and activity modification.  Onset of symptoms was gradual starting 2 years ago with gradually worsening course since that time.The patient noted no past surgery on the left hip(s).  Patient currently rates pain in the left hip at 10 out of 10 with activity. Patient has night pain, worsening of pain with activity and weight bearing, trendelenberg gait, pain that interfers with activities of daily living, pain with passive range of motion and crepitus. Patient has evidence of subchondral cysts, subchondral sclerosis and joint space narrowing by imaging studies. This condition presents safety issues increasing the risk of falls. There is no current active infection.  Patient Active Problem List   Diagnosis Date Noted  . S/P shoulder replacement, left 12/29/2015  . Paroxysmal atrial fibrillation (HCC) 01/21/2014  . Severe obstructive sleep apnea 01/21/2014  . Long-term (current) use of anticoagulants 11/17/2013  . Acute on chronic diastolic congestive heart failure (HCC) 11/09/2013  . Atrial fibrillation (HCC) 11/08/2013  . Long term current use of anticoagulant therapy 11/03/2013  . Anemia 07/31/2013  . Coronary artery disease   . GERD (gastroesophageal reflux disease)   . Pre-syncope   . History of class III angina pectoris 07/28/2013  . Arthritis 04/05/2013  . Hyperlipidemia with target LDL less than 70 10/11/2012  . Barrett's esophagus   . Diabetes mellitus (HCC) 08/25/2008  . Hypertensive heart disease   . Sleep apnea in adult     Past Medical History:  Diagnosis Date  . Arthritis   . Atrial fibrillation (HCC)    successful cardioversion 12/2013  . Barrett's esophagus   . Chronic kidney disease    CKD  . Coronary artery disease    a. h/o CABG 1997. b. stenting to graft-RCA, CB to LAD after LIMA in 2006. c. stenting to VG-RCA beyond initial stent and in-stent restenosis in 2011. c. Complex PCI 07/2013 to SVG-RCA and distal native RCA involving 4 sites (see cath report).  . Diabetes (HCC)    Type II  . Dyspnea    with exertion  . GERD (gastroesophageal reflux disease)   . History of kidney stones   . Hyperlipidemia   . Hypertension   . Myocardial infarction   . Pre-syncope    a. 09/2012: in the setting of mild dehydration with significant straining possibly inducing a vagal event while he was dragging heavy logs.   . Sleep apnea    hx of     Past Surgical History:  Procedure Laterality Date  . CARDIOVERSION N/A 12/29/2013   Procedure: CARDIOVERSION;  Surgeon: Laurey Morale, MD;  Location: [facility] ENDOSCOPY;  Service: Cardiovascular;  Laterality: N/A;  . COLONOSCOPY W/ POLYPECTOMY    . CORONARY ANGIOPLASTY WITH STENT PLACEMENT    . CORONARY ARTERY BYPASS GRAFT    . CORONARY STENT PLACEMENT  07/30/2013   DES to RCA     DR KELLY  . HERNIA REPAIR Left    Inguinal  . JOINT REPLACEMENT Bilateral    Shoulders  . KIDNEY STONE SURGERY    . LEFT HEART CATHETERIZATION WITH CORONARY/GRAFT ANGIOGRAM N/A 07/30/2013   Procedure:  LEFT HEART CATHETERIZATION WITH Isabel Caprice;  Surgeon: Lennette Bihari, MD;  Location: [facility] CATH LAB;  Service: Cardiovascular;  Laterality: N/A;  . LITHOTRIPSY    . NEPHROLITHOTOMY     kidney stone  . PERCUTANEOUS CORONARY INTERVENTION-BALLOON ONLY  07/30/2013   Procedure: PERCUTANEOUS CORONARY INTERVENTION-BALLOON ONLY;  Surgeon: Lennette Bihari, MD;  Location: [facility] CATH LAB;  Service: Cardiovascular;;  . PERCUTANEOUS CORONARY STENT INTERVENTION (PCI-S)  07/30/2013   Procedure:  PERCUTANEOUS CORONARY STENT INTERVENTION (PCI-S);  Surgeon: Lennette Bihari, MD;  Location: [facility] C[facility] LAB;  Service: Cardiovascular;;  . SHOULDER SURGERY Bilateral   . TOTAL SHOULDER REVISION Left 12/29/2015   Procedure: TOTAL SHOULDER REVISION ARHTROPLASTY;  Surgeon: Beverely Low, MD;  Location: [facility] OR;  Service: Orthopedics;  Laterality: Left;  . UPPER GI ENDOSCOPY       (Not in a hospital admission) Allergies  Allergen Reactions  . Metformin And Related Nausea And Vomiting    Dry Heaves  . Ivp Dye [Iodinated Diagnostic Agents] Other (See Comments)    Increased blood pressure, heart rate    Social History  Substance Use Topics  . Smoking status: Former Smoker    Years: 32.00  . Smokeless tobacco: Never Used  . Alcohol use Yes     Comment: 2-3 beers a month.    Family History  Problem Relation Age of Onset  . Heart attack Brother   . Heart disease Brother   . Diabetes Mother   . Heart disease Mother   . Hypertension Mother   . Cancer Father   . Diabetes Sister   . Hypertension Sister   . Heart disease Brother   . Diabetes Brother   . Colon cancer Neg Hx      Review of Systems  Constitutional: Positive for malaise/fatigue.  HENT: Positive for hearing loss.   Eyes: Negative.   Respiratory: Negative.   Cardiovascular: Negative.   Gastrointestinal: Negative.   Genitourinary: Negative.   Musculoskeletal: Positive for back pain and joint pain.  Skin: Negative.   Neurological: Negative.   Endo/Heme/Allergies: Negative.   Psychiatric/Behavioral: Negative.     Objective:  Physical Exam  Vitals reviewed. Constitutional: He is oriented to person, place, and time. He appears well-developed and well-nourished.  HENT:  Head: Normocephalic and atraumatic.  Eyes: Conjunctivae and EOM are normal. Pupils are equal, round, and reactive to light.  Neck: Normal range of motion. Neck supple.  Cardiovascular: Normal rate, regular rhythm and intact distal pulses.   Respiratory:  Effort normal and breath sounds normal.  GI: Soft. He exhibits no distension.  Genitourinary:  Genitourinary Comments: deferred  Musculoskeletal:       Left hip: He exhibits decreased range of motion, decreased strength, bony tenderness and crepitus.  Neurological: He is alert and oriented to person, place, and time. He has normal reflexes.  Skin: Skin is warm.  Psychiatric: He has a normal mood and affect. His behavior is normal. Judgment and thought content normal.    Vital signs in last 24 hours: @VSRANGES @  Labs:   Estimated body mass index is 25.55 kg/m as calculated from the following:   Height as of 12/27/15: 5\' 11"  (1.803 m).   Weight as of 12/27/15: 83.1 kg (183 lb 3.2 oz).   Imaging Review Plain radiographs demonstrate severe degenerative joint disease of the left hip(s). The bone quality appears to be adequate for age and reported activity level.  Assessment/Plan:  End stage arthritis, left hip(s)  The patient history, physical examination, clinical judgement  of the provider and imaging studies are consistent with end stage degenerative joint disease of the left hip(s) and total hip arthroplasty is deemed medically necessary. The treatment options including medical management, injection therapy, arthroscopy and arthroplasty were discussed at length. The risks and benefits of total hip arthroplasty were presented and reviewed. The risks due to aseptic loosening, infection, stiffness, dislocation/subluxation,  thromboembolic complications and other imponderables were discussed.  The patient acknowledged the explanation, agreed to proceed with the plan and consent was signed. Patient is being admitted for inpatient treatment for surgery, pain control, PT, OT, prophylactic antibiotics, VTE prophylaxis, progressive ambulation and ADL's and discharge planning.The patient is planning to be discharged home with HEP

## 2016-05-16 ENCOUNTER — Encounter (HOSPITAL_COMMUNITY): Payer: Self-pay

## 2016-05-16 ENCOUNTER — Encounter (HOSPITAL_COMMUNITY): Payer: Self-pay | Admitting: Vascular Surgery

## 2016-05-16 ENCOUNTER — Encounter (HOSPITAL_COMMUNITY)
Admission: RE | Admit: 2016-05-16 | Discharge: 2016-05-16 | Disposition: A | Discharge status: Home / Self Care | Payer: PPO | Source: Ambulatory Visit | Attending: Orthopedic Surgery | Admitting: Orthopedic Surgery

## 2016-05-16 DIAGNOSIS — Z01812 Encounter for preprocedural laboratory examination: Secondary | ICD-10-CM | POA: Diagnosis not present

## 2016-05-16 LAB — GLUCOSE, CAPILLARY: Glucose-Capillary: 341 mg/dL — ABNORMAL HIGH (ref 65–99)

## 2016-05-16 LAB — CBC
HCT: 33.8 % — ABNORMAL LOW (ref 39.0–52.0)
Hemoglobin: 11.3 g/dL — ABNORMAL LOW (ref 13.0–17.0)
MCH: 30.9 pg (ref 26.0–34.0)
MCHC: 33.4 g/dL (ref 30.0–36.0)
MCV: 92.3 fL (ref 78.0–100.0)
Platelets: 264 10*3/uL (ref 150–400)
RBC: 3.66 MIL/uL — ABNORMAL LOW (ref 4.22–5.81)
RDW: 13.5 % (ref 11.5–15.5)
WBC: 9.1 10*3/uL (ref 4.0–10.5)

## 2016-05-16 LAB — TYPE AND SCREEN
ABO/RH(D): O POS
ANTIBODY SCREEN: NEGATIVE

## 2016-05-16 LAB — BASIC METABOLIC PANEL
Anion gap: 12 (ref 5–15)
BUN: 42 mg/dL — AB (ref 6–20)
CALCIUM: 9.5 mg/dL (ref 8.9–10.3)
CO2: 21 mmol/L — ABNORMAL LOW (ref 22–32)
CREATININE: 1.71 mg/dL — AB (ref 0.61–1.24)
Chloride: 103 mmol/L (ref 101–111)
GFR calc non Af Amer: 37 mL/min — ABNORMAL LOW (ref >60)
GFR, EST AFRICAN AMERICAN: 43 mL/min — AB (ref >60)
Glucose, Bld: 368 mg/dL — ABNORMAL HIGH (ref 65–99)
Potassium: 4.5 mmol/L (ref 3.5–5.1)
SODIUM: 136 mmol/L (ref 135–145)

## 2016-05-16 LAB — SURGICAL PCR SCREEN
MRSA, PCR: NEGATIVE
STAPHYLOCOCCUS AUREUS: POSITIVE — AB

## 2016-05-16 NOTE — Pre-Procedure Instructions (Addendum)
[name]  05/16/2016      [id number] [name], [name] - [address] [name] [address] Phone: [phone] Fax: [phone]  [facility] - [place], [place] - [address] [name] [address] Phone: [phone] Fax: [phone]    Your procedure is scheduled on 05/22/16  Report to [facility] Admitting at 530 A.M.  Call this number if you have problems the morning of surgery:  [phone]   Remember:  Do not eat food or drink liquids after midnight.  Take these medicines the morning of surgery with A SIP OF WATER    Carvedilol(coreg),diltiazem, hydralazine,pantoprazole(protonix) nitro if needed  STOP all herbel meds, nsaids (aleve,naproxen,advil,ibuprofen)   Starting Today including all vitamins/supplements  Stop coumadin 05/18/16 Stop plavix per dr  No glimepiride day of surgery   How to Manage Your Diabetes Before and After Surgery  Why is it important to control my blood sugar before and after surgery? . Improving blood sugar levels before and after surgery helps healing and can limit problems. . A way of improving blood sugar control is eating a healthy diet by: o  Eating less sugar and carbohydrates o  Increasing activity/exercise o  Talking with your doctor about reaching your blood sugar goals . High blood sugars (greater than 180 mg/dL) can raise your risk of infections and slow your recovery, so you will need to focus on controlling your diabetes during the weeks before surgery. . Make sure that the doctor who takes care of your diabetes knows about your planned surgery including the date and location.  How do I manage my blood sugar before surgery? . Check your blood sugar at least 4 times a day, starting 2 days before surgery, to make sure that the level is not too high or low. o Check your blood sugar the morning of your surgery when you wake up and every  2 hours until you get to the Short Stay unit. . If your blood sugar is less than 70 mg/dL, you will need to treat for low blood sugar: o Do not take insulin. o Treat a low blood sugar (less than 70 mg/dL) with  cup of clear juice (cranberry or apple), 4 glucose tablets, OR glucose gel. o Recheck blood sugar in 15 minutes after treatment (to make sure it is greater than 70 mg/dL). If your blood sugar is not greater than 70 mg/dL on recheck, call [phone] for further instructions. . Report your blood sugar to the short stay nurse when you get to Short Stay.  . If you are admitted to the hospital after surgery: o Your blood sugar will be checked by the staff and you will probably be given insulin after surgery (instead of oral diabetes medicines) to make sure you have good blood sugar levels. o The goal for blood sugar control after surgery is 80-180 mg/dL.    WHAT DO I DO ABOUT MY DIABETES MEDICATION?   Marland Kitchen Do not take oral diabetes medicines (pills) the morning of surgery.(glimepiride)    Do not wear jewelry, make-up or nail polish.  Do not wear lotions, powders, or perfumes, or deoderant.  Do not shave 48 hours prior to surgery.  Men may shave face and neck.  Do not bring valuables to the hospital.  [facility] is not responsible for any belongings or valuables.  Contacts, dentures or bridgework may not be worn into surgery.  Leave  your suitcase in the car.  After surgery it may be brought to your room.  For patients admitted to the hospital, discharge time will be determined by your treatment team.  Patients discharged the day of surgery will not be allowed to drive home.   Special instructions:   Special Instructions: [facility] - Preparing for Surgery  Before surgery, you can play an important role.  Because skin is not sterile, your skin needs to be as free of germs as possible.  You can reduce the number of germs on you skin by washing with CHG (chlorahexidine gluconate)  soap before surgery.  CHG is an antiseptic cleaner which kills germs and bonds with the skin to continue killing germs even after washing.  Please DO NOT use if you have an allergy to CHG or antibacterial soaps.  If your skin becomes reddened/irritated stop using the CHG and inform your nurse when you arrive at Short Stay.  Do not shave (including legs and underarms) for at least 48 hours prior to the first CHG shower.  You may shave your face.  Please follow these instructions carefully:   1.  Shower with CHG Soap the night before surgery and the morning of Surgery.  2.  If you choose to wash your hair, wash your hair first as usual with your normal shampoo.  3.  After you shampoo, rinse your hair and body thoroughly to remove the Shampoo.  4.  Use CHG as you would any other liquid soap.  You can apply chg directly  to the skin and wash gently with scrungie or a clean washcloth.  5.  Apply the CHG Soap to your body ONLY FROM THE NECK DOWN.  Do not use on open wounds or open sores.  Avoid contact with your eyes ears, mouth and genitals (private parts).  Wash genitals (private parts)       with your normal soap.  6.  Wash thoroughly, paying special attention to the area where your surgery will be performed.  7.  Thoroughly rinse your body with warm water from the neck down.  8.  DO NOT shower/wash with your normal soap after using and rinsing off the CHG Soap.  9.  Pat yourself dry with a clean towel.            10.  Wear clean pajamas.            11.  Place clean sheets on your bed the night of your first shower and do not sleep with pets.  Day of Surgery  Do not apply any lotions/deodorants the morning of surgery.  Please wear clean clothes to the hospital/surgery center.  Please read over the  fact sheets that you were given.

## 2016-05-17 LAB — HEMOGLOBIN A1C
Hgb A1c MFr Bld: 8.7 % — ABNORMAL HIGH (ref 4.8–5.6)
MEAN PLASMA GLUCOSE: 203 mg/dL

## 2016-05-22 ENCOUNTER — Encounter (HOSPITAL_COMMUNITY): Admission: RE | Payer: Self-pay | Source: Ambulatory Visit

## 2016-05-22 ENCOUNTER — Inpatient Hospital Stay (HOSPITAL_COMMUNITY): Admission: RE | Admit: 2016-05-22 | Payer: PPO | Source: Ambulatory Visit | Admitting: Orthopedic Surgery

## 2016-05-22 SURGERY — ARTHROPLASTY, HIP, TOTAL, ANTERIOR APPROACH
Anesthesia: Choice | Laterality: Left

## 2016-05-26 DIAGNOSIS — Z7901 Long term (current) use of anticoagulants: Secondary | ICD-10-CM | POA: Diagnosis not present

## 2016-05-30 DIAGNOSIS — Z7984 Long term (current) use of oral hypoglycemic drugs: Secondary | ICD-10-CM | POA: Diagnosis not present

## 2016-05-30 DIAGNOSIS — E1165 Type 2 diabetes mellitus with hyperglycemia: Secondary | ICD-10-CM | POA: Diagnosis not present

## 2016-05-30 DIAGNOSIS — M1990 Unspecified osteoarthritis, unspecified site: Secondary | ICD-10-CM | POA: Diagnosis not present

## 2016-05-30 DIAGNOSIS — E1122 Type 2 diabetes mellitus with diabetic chronic kidney disease: Secondary | ICD-10-CM | POA: Diagnosis not present

## 2016-06-20 DIAGNOSIS — Z7901 Long term (current) use of anticoagulants: Secondary | ICD-10-CM | POA: Diagnosis not present

## 2016-06-22 DIAGNOSIS — Z7901 Long term (current) use of anticoagulants: Secondary | ICD-10-CM | POA: Diagnosis not present

## 2016-07-11 DIAGNOSIS — E119 Type 2 diabetes mellitus without complications: Secondary | ICD-10-CM | POA: Diagnosis not present

## 2016-07-11 DIAGNOSIS — Z7901 Long term (current) use of anticoagulants: Secondary | ICD-10-CM | POA: Diagnosis not present

## 2016-07-16 ENCOUNTER — Ambulatory Visit: Payer: Self-pay | Admitting: Orthopedic Surgery

## 2016-07-20 ENCOUNTER — Ambulatory Visit: Payer: Self-pay | Admitting: Orthopedic Surgery

## 2016-07-20 NOTE — H&P (Signed)
TOTAL HIP ADMISSION H&P  Patient is admitted for left total hip arthroplasty.  Subjective:  Chief Complaint: left hip pain  HPI: [name], 76 y.o. male, has a history of pain and functional disability in the left hip(s) due to arthritis and patient has failed non-surgical conservative treatments for greater than 12 weeks to include NSAID's and/or analgesics, flexibility and strengthening excercises, use of assistive devices, weight reduction as appropriate and activity modification.  Onset of symptoms was gradual starting 2 years ago with gradually worsening course since that time.The patient noted no past surgery on the left hip(s).  Patient currently rates pain in the left hip at 10 out of 10 with activity. Patient has night pain, worsening of pain with activity and weight bearing, trendelenberg gait, pain that interfers with activities of daily living, pain with passive range of motion and crepitus. Patient has evidence of subchondral cysts, subchondral sclerosis and joint space narrowing by imaging studies. This condition presents safety issues increasing the risk of falls. There is no current active infection.  Patient Active Problem List   Diagnosis Date Noted  . S/P shoulder replacement, left 12/29/2015  . Paroxysmal atrial fibrillation (HCC) 01/21/2014  . Severe obstructive sleep apnea 01/21/2014  . Long-term (current) use of anticoagulants 11/17/2013  . Acute on chronic diastolic congestive heart failure (HCC) 11/09/2013  . Atrial fibrillation (HCC) 11/08/2013  . Long term current use of anticoagulant therapy 11/03/2013  . Anemia 07/31/2013  . Coronary artery disease   . GERD (gastroesophageal reflux disease)   . Pre-syncope   . History of class III angina pectoris 07/28/2013  . Arthritis 04/05/2013  . Hyperlipidemia with target LDL less than 70 10/11/2012  . Barrett's esophagus   . Diabetes mellitus (HCC) 08/25/2008  . Hypertensive heart disease   . Sleep apnea in adult     Past Medical History:  Diagnosis Date  . Arthritis   . Atrial fibrillation (HCC)    successful cardioversion 12/2013  . Barrett's esophagus   . Chronic kidney disease    CKD  . Coronary artery disease    a. h/o CABG 1997. b. stenting to graft-RCA, CB to LAD after LIMA in 2006. c. stenting to VG-RCA beyond initial stent and in-stent restenosis in 2011. c. Complex PCI 07/2013 to SVG-RCA and distal native RCA involving 4 sites (see cath report).  . Diabetes (HCC)    Type II  . Dyspnea    with exertion  . GERD (gastroesophageal reflux disease)   . History of kidney stones   . Hyperlipidemia   . Hypertension   . Myocardial infarction   . Pre-syncope    a. 09/2012: in the setting of mild dehydration with significant straining possibly inducing a vagal event while he was dragging heavy logs.   . Sleep apnea    hx of     Past Surgical History:  Procedure Laterality Date  . CARDIOVERSION N/A 12/29/2013   Procedure: CARDIOVERSION;  Surgeon: [name] S McLean, MD;  Location: [facility] ENDOSCOPY;  Service: Cardiovascular;  Laterality: N/A;  . COLONOSCOPY W/ POLYPECTOMY    . CORONARY ANGIOPLASTY WITH STENT PLACEMENT    . CORONARY ARTERY BYPASS GRAFT    . CORONARY STENT PLACEMENT  07/30/2013   DES to RCA     DR KELLY  . HERNIA REPAIR Left    Inguinal  . JOINT REPLACEMENT Bilateral    Shoulders  . KIDNEY STONE SURGERY    . LEFT HEART CATHETERIZATION WITH CORONARY/GRAFT ANGIOGRAM N/A 07/30/2013   Procedure:   LEFT HEART CATHETERIZATION WITH CORONARY/GRAFT ANGIOGRAM N/A 07/30/2013   Procedure: LEFT HEART CATHETERIZATION WITH Isabel CapriceORONARY/GRAFT ANGIOGRAM;  Surgeon: Lennette Biharihomas A Kelly, MD;  [facility];  Service: Cardiovascular;  Laterality: N/A;  . LITHOTRIPSY    . NEPHROLITHOTOMY     kidney stone  . PERCUTANEOUS CORONARY INTERVENTION-BALLOON ONLY  07/30/2013   Procedure: PERCUTANEOUS CORONARY INTERVENTION-BALLOON ONLY;  Surgeon: Lennette Biharihomas A Kelly, MD;  [facility];  Service: Cardiovascular;;  .  PERCUTANEOUS CORONARY STENT INTERVENTION (PCI-S)  07/30/2013   Procedure: PERCUTANEOUS CORONARY STENT INTERVENTION (PCI-S);  Surgeon: Lennette Biharihomas A Kelly, MD;  [facility];  Service: Cardiovascular;;  . SHOULDER SURGERY Bilateral   . TOTAL SHOULDER REVISION Left 12/29/2015   Procedure: TOTAL SHOULDER REVISION ARHTROPLASTY;  Surgeon: Beverely LowSteve Norris, MD;  Location: [facility] OR;  Service: Orthopedics;  Laterality: Left;  . UPPER GI ENDOSCOPY       (Not in a hospital admission)      Allergies  Allergen Reactions  . Metformin And Related Nausea And Vomiting    Dry Heaves  . Ivp Dye [Iodinated Diagnostic Agents] Other (See Comments)    Increased blood pressure, heart rate    Social History  Substance Use Topics  . Smoking status: Former Smoker    Years: 32.00  . Smokeless tobacco: Never Used  . Alcohol use Yes      Comment: 2-3 beers a month.         Family History  Problem Relation Age of Onset  . Heart attack Brother   . Heart disease Brother   . Diabetes Mother   . Heart disease Mother   . Hypertension Mother   . Cancer Father   . Diabetes Sister   . Hypertension Sister   . Heart disease Brother   . Diabetes Brother   . Colon cancer Neg Hx      Review of Systems  Constitutional: Positive for malaise/fatigue.  HENT: Positive for hearing loss.   Eyes: Negative.   Respiratory: Negative.   Cardiovascular: Negative.   Gastrointestinal: Negative.   Genitourinary: Negative.   Musculoskeletal: Positive for back pain and joint pain.  Skin: Negative.   Neurological: Negative.   Endo/Heme/Allergies: Negative.   Psychiatric/Behavioral: Negative.     Objective:  Physical Exam  Vitals reviewed. Constitutional: He is oriented to person, place, and time. He appears well-developed and well-nourished.  HENT:  Head: Normocephalic and atraumatic.  Eyes: Conjunctivae and EOM are normal. Pupils are equal, round, and reactive to light.  Neck: Normal  range of motion. Neck supple.  Cardiovascular: Normal rate, regular rhythm and intact distal pulses.   Respiratory: Effort normal and breath sounds normal.  GI: Soft. He exhibits no distension.  Genitourinary:  Genitourinary Comments: deferred  Musculoskeletal:       Left hip: He exhibits decreased range of motion, decreased strength, bony tenderness and crepitus.  Neurological: He is alert and oriented to person, place, and time. He has normal reflexes.  Skin: Skin is warm.  Psychiatric: He has a normal mood and affect. His behavior is normal. Judgment and thought content normal.    Vital signs in last 24 hours: @VSRANGES @  Labs:   Estimated body mass index is 25.55 kg/m as calculated from the following:   Height as of 12/27/15: 5\' 11"  (1.803 m).   Weight as of 12/27/15: 83.1 kg (183 lb 3.2 oz).   Imaging Review Plain radiographs demonstrate severe degenerative joint disease of the left hip(s). The bone quality appears to be adequate  of the provider and imaging studies are consistent with end stage degenerative joint disease of the left hip(s) and total hip arthroplasty is deemed medically necessary. The treatment options including medical management, injection therapy, arthroscopy and arthroplasty were discussed at length. The risks and benefits of total hip arthroplasty were presented and reviewed. The risks due to aseptic loosening, infection, stiffness, dislocation/subluxation,  thromboembolic complications and other imponderables were discussed.  The patient acknowledged the explanation, agreed to proceed with the plan and consent was signed. Patient is being admitted for inpatient treatment for surgery, pain control, PT, OT, prophylactic antibiotics, VTE prophylaxis, progressive ambulation and ADL's and discharge planning.The patient is planning to be discharged home with HEP 

## 2016-07-24 ENCOUNTER — Encounter (HOSPITAL_COMMUNITY)
Admission: RE | Admit: 2016-07-24 | Discharge: 2016-07-24 | Disposition: A | Discharge status: Home / Self Care | Payer: PPO | Source: Ambulatory Visit | Attending: Orthopedic Surgery | Admitting: Orthopedic Surgery

## 2016-07-24 ENCOUNTER — Encounter (HOSPITAL_COMMUNITY): Payer: Self-pay

## 2016-07-24 DIAGNOSIS — Z0183 Encounter for blood typing: Secondary | ICD-10-CM | POA: Diagnosis not present

## 2016-07-24 DIAGNOSIS — M1612 Unilateral primary osteoarthritis, left hip: Secondary | ICD-10-CM | POA: Insufficient documentation

## 2016-07-24 DIAGNOSIS — Z01812 Encounter for preprocedural laboratory examination: Secondary | ICD-10-CM | POA: Insufficient documentation

## 2016-07-24 LAB — CBC
HEMATOCRIT: 34 % — AB (ref 39.0–52.0)
HEMOGLOBIN: 11 g/dL — AB (ref 13.0–17.0)
MCH: 30.3 pg (ref 26.0–34.0)
MCHC: 32.4 g/dL (ref 30.0–36.0)
MCV: 93.7 fL (ref 78.0–100.0)
Platelets: 272 10*3/uL (ref 150–400)
RBC: 3.63 MIL/uL — ABNORMAL LOW (ref 4.22–5.81)
RDW: 13.3 % (ref 11.5–15.5)
WBC: 8.9 10*3/uL (ref 4.0–10.5)

## 2016-07-24 LAB — TYPE AND SCREEN
ABO/RH(D): O POS
ANTIBODY SCREEN: NEGATIVE

## 2016-07-24 LAB — BASIC METABOLIC PANEL
ANION GAP: 11 (ref 5–15)
BUN: 29 mg/dL — AB (ref 6–20)
CALCIUM: 9.2 mg/dL (ref 8.9–10.3)
CO2: 22 mmol/L (ref 22–32)
Chloride: 104 mmol/L (ref 101–111)
Creatinine, Ser: 1.29 mg/dL — ABNORMAL HIGH (ref 0.61–1.24)
GFR calc Af Amer: >60 mL/min (ref >60)
GFR, EST NON AFRICAN AMERICAN: 52 mL/min — AB (ref >60)
Glucose, Bld: 131 mg/dL — ABNORMAL HIGH (ref 65–99)
POTASSIUM: 4.5 mmol/L (ref 3.5–5.1)
Sodium: 137 mmol/L (ref 135–145)

## 2016-07-24 LAB — SURGICAL PCR SCREEN
MRSA, PCR: NEGATIVE
STAPHYLOCOCCUS AUREUS: NEGATIVE

## 2016-07-24 LAB — GLUCOSE, CAPILLARY: Glucose-Capillary: 118 mg/dL — ABNORMAL HIGH (ref 65–99)

## 2016-07-24 NOTE — Pre-Procedure Instructions (Addendum)
[name]  07/24/2016      [id number] [name],  - [address] [place] [address] Phone: [phone] Fax: [phone]  [facility] - [place], [place] - [address] [place] [address] Phone: [phone] Fax: [phone]    Your procedure is scheduled on    Friday 08/04/16  Report to [facility] Admitting at 530 A.M.  Call this number if you have problems the morning of surgery:  [phone]   Remember:  Do not eat food or drink liquids after midnight.  Take these medicines the morning of surgery with A SIP OF WATER    TYLENOL OR TRAMADOL  IF NEEDED, CARVEDILOL (COREG), DIGOXIN (LANOXIN), DILTIAZEM (CARDIZEM), HYDRALAZINE (APRESOLINE), PANTOPRAZOLE (PROTONIX)   7 days prior to surgery STOP taking any Aspirin, Aleve, Naproxen, Ibuprofen, Motrin, Advil, Goody's, BC's, all herbal medications, fish oil, and all vitamins.  STOP COUMADIN 4 DAYS PRIOR TO SURGERY (LAST DOSE 07/30/16)     How to Manage Your Diabetes Before and After Surgery  Why is it important to control my blood sugar before and after surgery? . Improving blood sugar levels before and after surgery helps healing and can limit problems. . A way of improving blood sugar control is eating a healthy diet by: o  Eating less sugar and carbohydrates o  Increasing activity/exercise o  Talking with your doctor about reaching your blood sugar goals . High blood sugars (greater than 180 mg/dL) can raise your risk of infections and slow your recovery, so you will need to focus on controlling your diabetes during the weeks before surgery. . Make sure that the doctor who takes care of your diabetes knows about your planned surgery including the date and location.  How do I manage my blood sugar before surgery? . Check your blood sugar at least 4 times a day, starting 2 days before surgery, to make sure that the level  is not too high or low. o Check your blood sugar the morning of your surgery when you wake up and every 2 hours until you get to the Short Stay unit. . If your blood sugar is less than 70 mg/dL, you will need to treat for low blood sugar: o Do not take insulin. o Treat a low blood sugar (less than 70 mg/dL) with  cup of clear juice (cranberry or apple), 4 glucose tablets, OR glucose gel. o Recheck blood sugar in 15 minutes after treatment (to make sure it is greater than 70 mg/dL). If your blood sugar is not greater than 70 mg/dL on recheck, call [phone] for further instructions. . Report your blood sugar to the short stay nurse when you get to Short Stay.  . If you are admitted to the hospital after surgery: o Your blood sugar will be checked by the staff and you will probably be given insulin after surgery (instead of oral diabetes medicines) to make sure you have good blood sugar levels. o The goal for blood sugar control after surgery is 80-180 mg/dL.              WHAT DO I DO ABOUT MY DIABETES MEDICATION?   Marland Kitchen Do not take oral diabetes medicines (pills) the morning of surgery.  . THE NIGHT BEFORE SURGERY, take ___________ units of ___________insulin.       Marland Kitchen HE MORNING OF SURGERY, take _____________ units of __________insulin.  . The day of surgery,  do not take other diabetes injectables, including Byetta (exenatide), Bydureon (exenatide ER), Victoza (liraglutide), or Trulicity (dulaglutide).  . If your CBG is greater than 220 mg/dL, you may take  of your sliding scale (correction) dose of insulin.  Other Instructions:          Patient Signature:  Date:   Nurse Signature:  Date:   Reviewed and Endorsed by [facility] Patient Education Committee, August 2015  Do not wear jewelry, make-up or nail polish.  Do not wear lotions, powders, or perfumes, or deoderant.  Do not shave 48 hours prior to surgery.  Men may shave face and neck.  Do not bring valuables  to the hospital.  [facility] is not responsible for any belongings or valuables.  Contacts, dentures or bridgework may not be worn into surgery.  Leave your suitcase in the car.  After surgery it may be brought to your room.  For patients admitted to the hospital, discharge time will be determined by your treatment team.  Patients discharged the day of surgery will not be allowed to drive home.   Name and phone number of your driver:   Special instructions:  [place] - Preparing for Surgery  Before surgery, you can play an important role.  Because skin is not sterile, your skin needs to be as free of germs as possible.  You can reduce the number of germs on you skin by washing with CHG (chlorahexidine gluconate) soap before surgery.  CHG is an antiseptic cleaner which kills germs and bonds with the skin to continue killing germs even after washing.  Please DO NOT use if you have an allergy to CHG or antibacterial soaps.  If your skin becomes reddened/irritated stop using the CHG and inform your nurse when you arrive at Short Stay.  Do not shave (including legs and underarms) for at least 48 hours prior to the first CHG shower.  You may shave your face.  Please follow these instructions carefully:   1.  Shower with CHG Soap the night before surgery and the                                morning of Surgery.  2.  If you choose to wash your hair, wash your hair first as usual with your       normal shampoo.  3.  After you shampoo, rinse your hair and body thoroughly to remove the                      Shampoo.  4.  Use CHG as you would any other liquid soap.  You can apply chg directly       to the skin and wash gently with scrungie or a clean washcloth.  5.  Apply the CHG Soap to your body ONLY FROM THE NECK DOWN.        Do not use on open wounds or open sores.  Avoid contact with your eyes,       ears, mouth and genitals (private parts).  Wash genitals (private parts)       with your normal  soap.  6.  Wash thoroughly, paying special attention to the area where your surgery        will be performed.  7.  Thoroughly rinse your body with warm water from the neck down.  8.  DO NOT shower/wash with your normal soap  after using and rinsing off       the CHG Soap.  9.  Pat yourself dry with a clean towel.            10.  Wear clean pajamas.            11.  Place clean sheets on your bed the night of your first shower and do not        sleep with pets.  Day of Surgery  Do not apply any lotions/deoderants the morning of surgery.  Please wear clean clothes to the hospital/surgery center.      Please read over the following fact sheets that you were given. MRSA Information and Surgical Site Infection Prevention

## 2016-07-25 LAB — HEMOGLOBIN A1C
Hgb A1c MFr Bld: 6.5 % — ABNORMAL HIGH (ref 4.8–5.6)
Mean Plasma Glucose: 140 mg/dL

## 2016-07-28 NOTE — Progress Notes (Signed)
Anesthesia Chart Review:  Pt is a 76 year old male scheduled for L total hip arthroplasty anterior approach on 08/04/2016 with Samson Frederic, MD.   - Cardiologist is Delrae Rend, MD, last office visit 08/27/15. Follow up in 1 year recommended.  - PCP is Georgann Housekeeper, MD.   PMH includes:  CAD (s/p CABG 1997; stent to graft-RCA, CB to LAD after LIMA 2006; stent to graft-RCA 2011; PCI and DES to graft-RCA and distal native RCA 2015), PAF (successful cardioversion 12/2013), HTN, pre-syncope, DM, hyperlipidemia, CKD, OSA, GERD. Former smoker. BMI 21. S/p L shoulder arthroplasty 12/29/15.   Medications include: lipitor, carvedilol, digoxin, diltiazem, glimepiride, hydralazine, metformin, protonix, valsartan, coumadin. Pt to stop coumadin 4 days before surgery.   Preoperative labs reviewed.   - HbA1c 6.5, glucose 131.  - Cr 1.29, BUN 29; results are stable when compared to prior values - PT will be obtained DOS.   EKG 08/27/15 (correspondence 12/31/15 in media tab): NSR, left abnormality, LVH with repolarization abnormality, cannot exclude inferior and lateral ischemia. Per Dr. Nadara Eaton, no significant change from EKG 08/27/14.   Echo 10/29/13:  - Left ventricle: The cavity size was normal. Wall thickness was increased in a pattern of mild LVH. The estimated ejection fraction was 55%. There was basal inferior severe hypokinesis.Indeterminant diastolic function (atrial fibrillation). - Aortic valve: There was no stenosis. - Mitral valve: Mildly calcified annulus. There was no significant regurgitation. - Left atrium: The atrium was moderately dilated. - Right ventricle: The cavity size was normal. Systolic function was normal. - Tricuspid valve: Peak RV-RA gradient (S): 12 mm Hg. - Pulmonary arteries: PA peak pressure: 15 mm Hg (S). - Inferior vena cava: The vessel was normal in size. The respirophasic diameter changes were in the normal range (>= 50%), consistent with normal central venous  pressure. - Impressions: The patient was in atrial fibrillation. Technically difficult study with poor acoustic windows. Normal LV size with mild LV hypertrophy. EF 55% with basal inferior severe hypokinesis. Normal RV size and systolic function. No significant valvular abnormalities.  Cardiac cath 07/30/13:  - Significant native coronary artery disease with proximal 80% LAD stenoses and total mid LAD occlusion; 90% circumflex stenosis; and total proximal occlusion of the native RCA. - Patent LIMA graft to the LAD. - Old SVG occlusion of the graft which had supplied the circumflex marginal vessel. - SVG to RCA, with 70% new proximal stenosis, 99% in-stent restenosis in the proximal segment of the more proximal previously placed graft, 50-60% in-stent restenosis in the previously placed mid RCA stent, new 80 to percent distal anastomosis stenosis extending into the native RCA, and 95% calcified PLA stenosis. - Successful 4 lesion coronary intervention involving the SVG to RCA proximally, mid, and distally, as well as the PLA branch of the distal native RCA  If PT acceptable DOS, I anticipate pt can proceed with surgery as scheduled.   [name], FNP-BC [facility] Short Stay Surgical Center/Anesthesiology Phone: [phone] 07/28/2016 12:27 PM

## 2016-08-03 MED ORDER — CEFAZOLIN SODIUM-DEXTROSE 2-4 GM/100ML-% IV SOLN
2.0000 g | INTRAVENOUS | Status: AC
  Administered 2016-08-04: 2 g via INTRAVENOUS
  Filled 2016-08-03: qty 100

## 2016-08-03 MED ORDER — TRANEXAMIC ACID 1000 MG/10ML IV SOLN
1000.0000 mg | INTRAVENOUS | Status: AC
  Administered 2016-08-04: 1000 mg via INTRAVENOUS
  Filled 2016-08-03: qty 10

## 2016-08-03 MED ORDER — ACETAMINOPHEN 10 MG/ML IV SOLN
1000.0000 mg | INTRAVENOUS | Status: AC
  Administered 2016-08-04: 1000 mg via INTRAVENOUS
  Filled 2016-08-03 (×2): qty 100

## 2016-08-04 ENCOUNTER — Inpatient Hospital Stay (HOSPITAL_COMMUNITY): Payer: PPO

## 2016-08-04 ENCOUNTER — Encounter (HOSPITAL_COMMUNITY): Payer: Self-pay | Admitting: General Practice

## 2016-08-04 ENCOUNTER — Inpatient Hospital Stay (HOSPITAL_COMMUNITY): Payer: PPO | Admitting: Emergency Medicine

## 2016-08-04 ENCOUNTER — Encounter (HOSPITAL_COMMUNITY)
Admission: RE | Disposition: A | Discharge status: Home / Self Care | Payer: Self-pay | Source: Ambulatory Visit | Attending: Orthopedic Surgery

## 2016-08-04 ENCOUNTER — Inpatient Hospital Stay (HOSPITAL_COMMUNITY)
Admission: RE | Admit: 2016-08-04 | Discharge: 2016-08-05 | DRG: 470 | Disposition: A | Discharge status: Home / Self Care | Payer: PPO | Source: Ambulatory Visit | Attending: Orthopedic Surgery | Admitting: Orthopedic Surgery

## 2016-08-04 DIAGNOSIS — Z888 Allergy status to other drugs, medicaments and biological substances status: Secondary | ICD-10-CM

## 2016-08-04 DIAGNOSIS — M1612 Unilateral primary osteoarthritis, left hip: Secondary | ICD-10-CM | POA: Diagnosis not present

## 2016-08-04 DIAGNOSIS — N189 Chronic kidney disease, unspecified: Secondary | ICD-10-CM | POA: Diagnosis present

## 2016-08-04 DIAGNOSIS — Z96642 Presence of left artificial hip joint: Secondary | ICD-10-CM | POA: Diagnosis not present

## 2016-08-04 DIAGNOSIS — Z87891 Personal history of nicotine dependence: Secondary | ICD-10-CM

## 2016-08-04 DIAGNOSIS — Z7984 Long term (current) use of oral hypoglycemic drugs: Secondary | ICD-10-CM | POA: Diagnosis not present

## 2016-08-04 DIAGNOSIS — Z7901 Long term (current) use of anticoagulants: Secondary | ICD-10-CM | POA: Diagnosis not present

## 2016-08-04 DIAGNOSIS — Z471 Aftercare following joint replacement surgery: Secondary | ICD-10-CM | POA: Diagnosis not present

## 2016-08-04 DIAGNOSIS — Z96612 Presence of left artificial shoulder joint: Secondary | ICD-10-CM | POA: Diagnosis not present

## 2016-08-04 DIAGNOSIS — Z09 Encounter for follow-up examination after completed treatment for conditions other than malignant neoplasm: Secondary | ICD-10-CM

## 2016-08-04 DIAGNOSIS — E1122 Type 2 diabetes mellitus with diabetic chronic kidney disease: Secondary | ICD-10-CM | POA: Diagnosis not present

## 2016-08-04 DIAGNOSIS — I480 Paroxysmal atrial fibrillation: Secondary | ICD-10-CM | POA: Diagnosis not present

## 2016-08-04 DIAGNOSIS — I4891 Unspecified atrial fibrillation: Secondary | ICD-10-CM | POA: Diagnosis present

## 2016-08-04 DIAGNOSIS — E119 Type 2 diabetes mellitus without complications: Secondary | ICD-10-CM | POA: Diagnosis not present

## 2016-08-04 DIAGNOSIS — Z419 Encounter for procedure for purposes other than remedying health state, unspecified: Secondary | ICD-10-CM

## 2016-08-04 DIAGNOSIS — I252 Old myocardial infarction: Secondary | ICD-10-CM | POA: Diagnosis not present

## 2016-08-04 DIAGNOSIS — I2510 Atherosclerotic heart disease of native coronary artery without angina pectoris: Secondary | ICD-10-CM | POA: Diagnosis present

## 2016-08-04 DIAGNOSIS — M1611 Unilateral primary osteoarthritis, right hip: Secondary | ICD-10-CM | POA: Diagnosis present

## 2016-08-04 DIAGNOSIS — Z951 Presence of aortocoronary bypass graft: Secondary | ICD-10-CM | POA: Diagnosis not present

## 2016-08-04 DIAGNOSIS — K219 Gastro-esophageal reflux disease without esophagitis: Secondary | ICD-10-CM | POA: Diagnosis present

## 2016-08-04 DIAGNOSIS — M1990 Unspecified osteoarthritis, unspecified site: Secondary | ICD-10-CM | POA: Diagnosis not present

## 2016-08-04 DIAGNOSIS — Z79899 Other long term (current) drug therapy: Secondary | ICD-10-CM | POA: Diagnosis not present

## 2016-08-04 DIAGNOSIS — Z91041 Radiographic dye allergy status: Secondary | ICD-10-CM | POA: Diagnosis not present

## 2016-08-04 DIAGNOSIS — I129 Hypertensive chronic kidney disease with stage 1 through stage 4 chronic kidney disease, or unspecified chronic kidney disease: Secondary | ICD-10-CM | POA: Diagnosis present

## 2016-08-04 HISTORY — PX: TOTAL HIP ARTHROPLASTY: SHX124

## 2016-08-04 LAB — CREATININE, SERUM
CREATININE: 1.29 mg/dL — AB (ref 0.61–1.24)
GFR calc Af Amer: >60 mL/min (ref >60)
GFR, EST NON AFRICAN AMERICAN: 52 mL/min — AB (ref >60)

## 2016-08-04 LAB — PROTIME-INR
INR: 1.2
Prothrombin Time: 15.3 seconds — ABNORMAL HIGH (ref 11.4–15.2)

## 2016-08-04 LAB — CBC
HCT: 31.8 % — ABNORMAL LOW (ref 39.0–52.0)
Hemoglobin: 10.2 g/dL — ABNORMAL LOW (ref 13.0–17.0)
MCH: 30.2 pg (ref 26.0–34.0)
MCHC: 32.1 g/dL (ref 30.0–36.0)
MCV: 94.1 fL (ref 78.0–100.0)
PLATELETS: 222 10*3/uL (ref 150–400)
RBC: 3.38 MIL/uL — ABNORMAL LOW (ref 4.22–5.81)
RDW: 13.3 % (ref 11.5–15.5)
WBC: 14.7 10*3/uL — ABNORMAL HIGH (ref 4.0–10.5)

## 2016-08-04 LAB — GLUCOSE, CAPILLARY
GLUCOSE-CAPILLARY: 116 mg/dL — AB (ref 65–99)
Glucose-Capillary: 133 mg/dL — ABNORMAL HIGH (ref 65–99)
Glucose-Capillary: 129 mg/dL — ABNORMAL HIGH (ref 65–99)
Glucose-Capillary: 256 mg/dL — ABNORMAL HIGH (ref 65–99)

## 2016-08-04 SURGERY — ARTHROPLASTY, HIP, TOTAL, ANTERIOR APPROACH
Anesthesia: Spinal | Site: Hip | Laterality: Left

## 2016-08-04 MED ORDER — DEXTROSE 5 % IV SOLN
500.0000 mg | Freq: Four times a day (QID) | INTRAVENOUS | Status: DC | PRN
  Filled 2016-08-04: qty 5

## 2016-08-04 MED ORDER — TRANEXAMIC ACID 1000 MG/10ML IV SOLN
1000.0000 mg | Freq: Once | INTRAVENOUS | Status: AC
  Administered 2016-08-04: 1000 mg via INTRAVENOUS
  Filled 2016-08-04: qty 10

## 2016-08-04 MED ORDER — FENTANYL CITRATE (PF) 100 MCG/2ML IJ SOLN
25.0000 ug | INTRAMUSCULAR | Status: DC | PRN
Start: 2016-08-04 — End: 2016-08-04

## 2016-08-04 MED ORDER — DILTIAZEM HCL ER COATED BEADS 120 MG PO CP24
120.0000 mg | ORAL_CAPSULE | Freq: Two times a day (BID) | ORAL | Status: DC
  Administered 2016-08-05: 120 mg via ORAL
  Filled 2016-08-04 (×2): qty 1

## 2016-08-04 MED ORDER — PANTOPRAZOLE SODIUM 40 MG PO TBEC
40.0000 mg | DELAYED_RELEASE_TABLET | Freq: Two times a day (BID) | ORAL | Status: DC
  Administered 2016-08-05: 40 mg via ORAL
  Filled 2016-08-04 (×2): qty 1

## 2016-08-04 MED ORDER — ONDANSETRON HCL 4 MG/2ML IJ SOLN: 4.0000 mg | Freq: Once | INTRAMUSCULAR | Status: DC | PRN

## 2016-08-04 MED ORDER — ACETAMINOPHEN 650 MG RE SUPP: 650.0000 mg | Freq: Four times a day (QID) | RECTAL | Status: DC | PRN

## 2016-08-04 MED ORDER — SUCCINYLCHOLINE CHLORIDE 200 MG/10ML IV SOSY
PREFILLED_SYRINGE | INTRAVENOUS | Status: AC
  Filled 2016-08-04: qty 10

## 2016-08-04 MED ORDER — DOCUSATE SODIUM 100 MG PO CAPS
100.0000 mg | ORAL_CAPSULE | Freq: Two times a day (BID) | ORAL | Status: DC
  Administered 2016-08-04 – 2016-08-05 (×2): 100 mg via ORAL
  Filled 2016-08-04 (×2): qty 1

## 2016-08-04 MED ORDER — PHENYLEPHRINE 40 MCG/ML (10ML) SYRINGE FOR IV PUSH (FOR BLOOD PRESSURE SUPPORT)
PREFILLED_SYRINGE | INTRAVENOUS | Status: AC
  Filled 2016-08-04: qty 10

## 2016-08-04 MED ORDER — ATORVASTATIN CALCIUM 10 MG PO TABS
10.0000 mg | ORAL_TABLET | Freq: Every day | ORAL | Status: DC
  Administered 2016-08-04: 10 mg via ORAL
  Filled 2016-08-04: qty 1

## 2016-08-04 MED ORDER — PROPOFOL 10 MG/ML IV BOLUS
INTRAVENOUS | Status: AC
  Filled 2016-08-04: qty 20

## 2016-08-04 MED ORDER — METHOCARBAMOL 500 MG PO TABS
ORAL_TABLET | ORAL | Status: AC
Start: 2016-08-04 — End: 2016-08-04
  Filled 2016-08-04: qty 1

## 2016-08-04 MED ORDER — 0.9 % SODIUM CHLORIDE (POUR BTL) OPTIME
TOPICAL | Status: DC | PRN
  Administered 2016-08-04: 1000 mL

## 2016-08-04 MED ORDER — LIDOCAINE 2% (20 MG/ML) 5 ML SYRINGE
INTRAMUSCULAR | Status: AC
  Filled 2016-08-04: qty 5

## 2016-08-04 MED ORDER — FENTANYL CITRATE (PF) 250 MCG/5ML IJ SOLN
INTRAMUSCULAR | Status: AC
  Filled 2016-08-04: qty 5

## 2016-08-04 MED ORDER — CEFAZOLIN SODIUM-DEXTROSE 2-4 GM/100ML-% IV SOLN
2.0000 g | Freq: Four times a day (QID) | INTRAVENOUS | Status: AC
  Administered 2016-08-04 (×2): 2 g via INTRAVENOUS
  Filled 2016-08-04 (×2): qty 100

## 2016-08-04 MED ORDER — PROPOFOL 10 MG/ML IV BOLUS
INTRAVENOUS | Status: DC | PRN
  Administered 2016-08-04: 50 mg via INTRAVENOUS

## 2016-08-04 MED ORDER — HYDROCODONE-ACETAMINOPHEN 5-325 MG PO TABS
1.0000 | ORAL_TABLET | ORAL | Status: DC | PRN
  Administered 2016-08-04: 1 via ORAL
  Administered 2016-08-04: 2 via ORAL
  Filled 2016-08-04: qty 2

## 2016-08-04 MED ORDER — SODIUM CHLORIDE 0.9 % IR SOLN
Status: DC | PRN
  Administered 2016-08-04: 3000 mL

## 2016-08-04 MED ORDER — ONDANSETRON HCL 4 MG/2ML IJ SOLN: 4.0000 mg | Freq: Four times a day (QID) | INTRAMUSCULAR | Status: DC | PRN

## 2016-08-04 MED ORDER — HYDROCODONE-ACETAMINOPHEN 5-325 MG PO TABS: 1.0000 | ORAL_TABLET | ORAL | 0 refills | Status: DC | PRN

## 2016-08-04 MED ORDER — DIGOXIN 125 MCG PO TABS
0.1250 mg | ORAL_TABLET | Freq: Every day | ORAL | Status: DC
  Administered 2016-08-05: 0.125 mg via ORAL
  Filled 2016-08-04: qty 1

## 2016-08-04 MED ORDER — LIDOCAINE HCL (CARDIAC) 20 MG/ML IV SOLN
INTRAVENOUS | Status: DC | PRN
  Administered 2016-08-04: 40 mg via INTRAVENOUS
  Administered 2016-08-04: 60 mg via INTRAVENOUS

## 2016-08-04 MED ORDER — ONDANSETRON HCL 4 MG PO TABS: 4.0000 mg | ORAL_TABLET | Freq: Three times a day (TID) | ORAL | 0 refills | Status: DC | PRN

## 2016-08-04 MED ORDER — HYDROCODONE-ACETAMINOPHEN 5-325 MG PO TABS
ORAL_TABLET | ORAL | Status: AC
  Administered 2016-08-04: 1 via ORAL
  Filled 2016-08-04: qty 1

## 2016-08-04 MED ORDER — SODIUM CHLORIDE 0.9 % IV SOLN
INTRAVENOUS | Status: DC
  Administered 2016-08-04: 14:00:00 via INTRAVENOUS

## 2016-08-04 MED ORDER — EPHEDRINE 5 MG/ML INJ
INTRAVENOUS | Status: AC
  Filled 2016-08-04: qty 10

## 2016-08-04 MED ORDER — NITROGLYCERIN 0.4 MG SL SUBL: 0.4000 mg | SUBLINGUAL_TABLET | SUBLINGUAL | Status: DC | PRN

## 2016-08-04 MED ORDER — DIPHENHYDRAMINE HCL 12.5 MG/5ML PO ELIX: 12.5000 mg | ORAL_SOLUTION | ORAL | Status: DC | PRN

## 2016-08-04 MED ORDER — GLIMEPIRIDE 4 MG PO TABS
2.0000 mg | ORAL_TABLET | Freq: Two times a day (BID) | ORAL | Status: DC
  Administered 2016-08-04 – 2016-08-05 (×2): 2 mg via ORAL
  Filled 2016-08-04 (×2): qty 1

## 2016-08-04 MED ORDER — KETOROLAC TROMETHAMINE 30 MG/ML IJ SOLN
INTRAMUSCULAR | Status: DC | PRN
  Administered 2016-08-04: 30 mg via INTRA_ARTICULAR

## 2016-08-04 MED ORDER — POLYETHYLENE GLYCOL 3350 17 G PO PACK: 17.0000 g | PACK | Freq: Every day | ORAL | Status: DC | PRN

## 2016-08-04 MED ORDER — DEXTROSE 5 % IV SOLN
INTRAVENOUS | Status: DC | PRN
  Administered 2016-08-04: 50 ug/min via INTRAVENOUS

## 2016-08-04 MED ORDER — METOCLOPRAMIDE HCL 5 MG/ML IJ SOLN: 5.0000 mg | Freq: Three times a day (TID) | INTRAMUSCULAR | Status: DC | PRN

## 2016-08-04 MED ORDER — BUPIVACAINE-EPINEPHRINE 0.5% -1:200000 IJ SOLN
INTRAMUSCULAR | Status: DC | PRN
  Administered 2016-08-04: 30 mL

## 2016-08-04 MED ORDER — ENOXAPARIN SODIUM 40 MG/0.4ML ~~LOC~~ SOLN: 40.0000 mg | SUBCUTANEOUS | 0 refills | Status: DC

## 2016-08-04 MED ORDER — CARVEDILOL 25 MG PO TABS
25.0000 mg | ORAL_TABLET | Freq: Two times a day (BID) | ORAL | Status: DC
  Administered 2016-08-05: 25 mg via ORAL
  Filled 2016-08-04 (×2): qty 1

## 2016-08-04 MED ORDER — METOCLOPRAMIDE HCL 5 MG PO TABS: 5.0000 mg | ORAL_TABLET | Freq: Three times a day (TID) | ORAL | Status: DC | PRN

## 2016-08-04 MED ORDER — ENOXAPARIN SODIUM 30 MG/0.3ML ~~LOC~~ SOLN
30.0000 mg | SUBCUTANEOUS | Status: DC
  Administered 2016-08-05: 30 mg via SUBCUTANEOUS
  Filled 2016-08-04: qty 0.3

## 2016-08-04 MED ORDER — ACETAMINOPHEN 325 MG PO TABS: 650.0000 mg | ORAL_TABLET | Freq: Four times a day (QID) | ORAL | Status: DC | PRN

## 2016-08-04 MED ORDER — SENNA 8.6 MG PO TABS
2.0000 | ORAL_TABLET | Freq: Every day | ORAL | Status: DC
  Administered 2016-08-04: 17.2 mg via ORAL
  Filled 2016-08-04: qty 2

## 2016-08-04 MED ORDER — METHOCARBAMOL 500 MG PO TABS
500.0000 mg | ORAL_TABLET | Freq: Four times a day (QID) | ORAL | Status: DC | PRN
  Administered 2016-08-04: 500 mg via ORAL

## 2016-08-04 MED ORDER — BUPIVACAINE HCL (PF) 0.5 % IJ SOLN
INTRAMUSCULAR | Status: AC
  Filled 2016-08-04: qty 30

## 2016-08-04 MED ORDER — BUPIVACAINE-EPINEPHRINE (PF) 0.5% -1:200000 IJ SOLN
INTRAMUSCULAR | Status: AC
  Filled 2016-08-04: qty 30

## 2016-08-04 MED ORDER — POVIDONE-IODINE 10 % EX SWAB: 2.0000 "application " | Freq: Once | CUTANEOUS | Status: DC

## 2016-08-04 MED ORDER — CHLORHEXIDINE GLUCONATE 4 % EX LIQD: 60.0000 mL | Freq: Once | CUTANEOUS | Status: DC

## 2016-08-04 MED ORDER — HYDROMORPHONE HCL 1 MG/ML IJ SOLN: 0.5000 mg | INTRAMUSCULAR | Status: DC | PRN

## 2016-08-04 MED ORDER — SODIUM CHLORIDE 0.9 % IR SOLN
Status: DC | PRN
  Administered 2016-08-04: 1000 mL

## 2016-08-04 MED ORDER — WARFARIN SODIUM 7.5 MG PO TABS
7.5000 mg | ORAL_TABLET | Freq: Once | ORAL | Status: AC
  Administered 2016-08-04: 7.5 mg via ORAL
  Filled 2016-08-04: qty 1

## 2016-08-04 MED ORDER — PROPOFOL 500 MG/50ML IV EMUL
INTRAVENOUS | Status: DC | PRN
  Administered 2016-08-04: 50 ug/kg/min via INTRAVENOUS

## 2016-08-04 MED ORDER — INSULIN ASPART 100 UNIT/ML ~~LOC~~ SOLN: 0.0000 [IU] | Freq: Three times a day (TID) | SUBCUTANEOUS | Status: DC

## 2016-08-04 MED ORDER — IRBESARTAN 150 MG PO TABS
75.0000 mg | ORAL_TABLET | Freq: Every day | ORAL | Status: DC
  Administered 2016-08-05: 75 mg via ORAL
  Filled 2016-08-04 (×2): qty 1

## 2016-08-04 MED ORDER — KETOROLAC TROMETHAMINE 15 MG/ML IJ SOLN
INTRAMUSCULAR | Status: AC
  Administered 2016-08-04: 7.5 mg via INTRAVENOUS
  Filled 2016-08-04: qty 1

## 2016-08-04 MED ORDER — PHENOL 1.4 % MT LIQD: 1.0000 | OROMUCOSAL | Status: DC | PRN

## 2016-08-04 MED ORDER — ROCURONIUM BROMIDE 10 MG/ML (PF) SYRINGE
PREFILLED_SYRINGE | INTRAVENOUS | Status: AC
  Filled 2016-08-04: qty 5

## 2016-08-04 MED ORDER — SODIUM CHLORIDE 0.9 % IV SOLN: INTRAVENOUS | Status: DC

## 2016-08-04 MED ORDER — ONDANSETRON HCL 4 MG/2ML IJ SOLN
INTRAMUSCULAR | Status: AC
  Filled 2016-08-04: qty 2

## 2016-08-04 MED ORDER — WARFARIN - PHARMACIST DOSING INPATIENT: Freq: Every day | Status: DC

## 2016-08-04 MED ORDER — HYDRALAZINE HCL 50 MG PO TABS
50.0000 mg | ORAL_TABLET | Freq: Three times a day (TID) | ORAL | Status: DC
  Administered 2016-08-04 – 2016-08-05 (×2): 50 mg via ORAL
  Filled 2016-08-04 (×3): qty 1

## 2016-08-04 MED ORDER — LACTATED RINGERS IV SOLN
INTRAVENOUS | Status: DC | PRN
  Administered 2016-08-04: 06:00:00 via INTRAVENOUS

## 2016-08-04 MED ORDER — ONDANSETRON HCL 4 MG PO TABS: 4.0000 mg | ORAL_TABLET | Freq: Four times a day (QID) | ORAL | Status: DC | PRN

## 2016-08-04 MED ORDER — DEXAMETHASONE SODIUM PHOSPHATE 10 MG/ML IJ SOLN
10.0000 mg | Freq: Once | INTRAMUSCULAR | Status: AC
  Administered 2016-08-05: 10 mg via INTRAVENOUS
  Filled 2016-08-04: qty 1

## 2016-08-04 MED ORDER — KETOROLAC TROMETHAMINE 15 MG/ML IJ SOLN
7.5000 mg | Freq: Four times a day (QID) | INTRAMUSCULAR | Status: AC
  Administered 2016-08-04 – 2016-08-05 (×4): 7.5 mg via INTRAVENOUS
  Filled 2016-08-04 (×3): qty 1

## 2016-08-04 MED ORDER — MENTHOL 3 MG MT LOZG: 1.0000 | LOZENGE | OROMUCOSAL | Status: DC | PRN

## 2016-08-04 MED ORDER — EPHEDRINE SULFATE 50 MG/ML IJ SOLN
INTRAMUSCULAR | Status: DC | PRN
  Administered 2016-08-04 (×4): 5 mg via INTRAVENOUS

## 2016-08-04 MED ORDER — SODIUM CHLORIDE 0.9 % IJ SOLN
INTRAMUSCULAR | Status: DC | PRN
  Administered 2016-08-04: 30 mL

## 2016-08-04 MED ORDER — SENNA 8.6 MG PO TABS: 2.0000 | ORAL_TABLET | Freq: Every day | ORAL | 3 refills | Status: DC

## 2016-08-04 MED ORDER — METFORMIN HCL ER 500 MG PO TB24
500.0000 mg | ORAL_TABLET | Freq: Two times a day (BID) | ORAL | Status: DC
  Administered 2016-08-04 – 2016-08-05 (×2): 500 mg via ORAL
  Filled 2016-08-04 (×2): qty 1

## 2016-08-04 MED ORDER — BUPIVACAINE HCL (PF) 0.5 % IJ SOLN
INTRAMUSCULAR | Status: DC | PRN
  Administered 2016-08-04: 3 mL via INTRATHECAL

## 2016-08-04 MED ORDER — DOCUSATE SODIUM 100 MG PO CAPS: 100.0000 mg | ORAL_CAPSULE | Freq: Two times a day (BID) | ORAL | 3 refills | Status: DC

## 2016-08-04 SURGICAL SUPPLY — 54 items
ALCOHOL ISOPROPYL (RUBBING) (MISCELLANEOUS) ×3 IMPLANT
BLADE CLIPPER SURG (BLADE) IMPLANT
CAPT HIP TOTAL 2 ×3 IMPLANT
CHLORAPREP W/TINT 26ML (MISCELLANEOUS) ×3 IMPLANT
COVER SURGICAL LIGHT HANDLE (MISCELLANEOUS) ×3 IMPLANT
DERMABOND ADVANCED (GAUZE/BANDAGES/DRESSINGS) ×2
DERMABOND ADVANCED .7 DNX12 (GAUZE/BANDAGES/DRESSINGS) ×1 IMPLANT
DRAPE C-ARM 42X72 X-RAY (DRAPES) ×3 IMPLANT
DRAPE STERI IOBAN 125X83 (DRAPES) ×3 IMPLANT
DRAPE U-SHAPE 47X51 STRL (DRAPES) ×9 IMPLANT
DRSG AQUACEL AG ADV 3.5X10 (GAUZE/BANDAGES/DRESSINGS) ×3 IMPLANT
ELECT BLADE 4.0 EZ CLEAN MEGAD (MISCELLANEOUS) ×3
ELECT REM PT RETURN 9FT ADLT (ELECTROSURGICAL) ×3
ELECTRODE BLDE 4.0 EZ CLN MEGD (MISCELLANEOUS) ×1 IMPLANT
ELECTRODE REM PT RTRN 9FT ADLT (ELECTROSURGICAL) ×1 IMPLANT
EVACUATOR 1/8 PVC DRAIN (DRAIN) IMPLANT
GLOVE BIO SURGEON STRL SZ8.5 (GLOVE) ×6 IMPLANT
GLOVE BIOGEL PI IND STRL 8.5 (GLOVE) ×1 IMPLANT
GLOVE BIOGEL PI INDICATOR 8.5 (GLOVE) ×2
GOWN STRL REUS W/ TWL LRG LVL3 (GOWN DISPOSABLE) ×2 IMPLANT
GOWN STRL REUS W/TWL 2XL LVL3 (GOWN DISPOSABLE) ×3 IMPLANT
GOWN STRL REUS W/TWL LRG LVL3 (GOWN DISPOSABLE) ×4
HANDPIECE INTERPULSE COAX TIP (DISPOSABLE) ×2
HOOD PEEL AWAY FACE SHEILD DIS (HOOD) ×6 IMPLANT
KIT BASIN OR (CUSTOM PROCEDURE TRAY) ×3 IMPLANT
KIT ROOM TURNOVER OR (KITS) ×3 IMPLANT
MANIFOLD NEPTUNE II (INSTRUMENTS) ×3 IMPLANT
MARKER SKIN DUAL TIP RULER LAB (MISCELLANEOUS) ×6 IMPLANT
NEEDLE 18GX1X1/2 (RX/OR ONLY) (NEEDLE) ×3 IMPLANT
NEEDLE SPNL 18GX3.5 QUINCKE PK (NEEDLE) ×3 IMPLANT
NS IRRIG 1000ML POUR BTL (IV SOLUTION) ×3 IMPLANT
PACK TOTAL JOINT (CUSTOM PROCEDURE TRAY) ×3 IMPLANT
PACK UNIVERSAL I (CUSTOM PROCEDURE TRAY) ×3 IMPLANT
PAD ARMBOARD 7.5X6 YLW CONV (MISCELLANEOUS) ×6 IMPLANT
SAW OSC TIP CART 19.5X105X1.3 (SAW) ×3 IMPLANT
SEALER BIPOLAR AQUA 6.0 (INSTRUMENTS) ×3 IMPLANT
SET HNDPC FAN SPRY TIP SCT (DISPOSABLE) ×1 IMPLANT
SLEEVE SURGEON STRL (DRAPES) ×3 IMPLANT
SOL PREP POV-IOD 4OZ 10% (MISCELLANEOUS) ×3 IMPLANT
SUT ETHIBOND NAB CT1 #1 30IN (SUTURE) ×6 IMPLANT
SUT MNCRL AB 3-0 PS2 18 (SUTURE) ×3 IMPLANT
SUT MON AB 2-0 CT1 36 (SUTURE) ×3 IMPLANT
SUT VIC AB 1 CT1 27 (SUTURE) ×2
SUT VIC AB 1 CT1 27XBRD ANBCTR (SUTURE) ×1 IMPLANT
SUT VIC AB 2-0 CT1 27 (SUTURE) ×2
SUT VIC AB 2-0 CT1 TAPERPNT 27 (SUTURE) ×1 IMPLANT
SUT VLOC 180 0 24IN GS25 (SUTURE) ×3 IMPLANT
SYR 3ML LL SCALE MARK (SYRINGE) ×3 IMPLANT
SYR 50ML LL SCALE MARK (SYRINGE) ×3 IMPLANT
TOWEL OR 17X24 6PK STRL BLUE (TOWEL DISPOSABLE) ×3 IMPLANT
TOWEL OR 17X26 10 PK STRL BLUE (TOWEL DISPOSABLE) ×3 IMPLANT
TRAY CATH 16FR W/PLASTIC CATH (SET/KITS/TRAYS/PACK) ×3 IMPLANT
TRAY FOLEY CATH SILVER 16FR (SET/KITS/TRAYS/PACK) IMPLANT
WATER STERILE IRR 1000ML POUR (IV SOLUTION) ×9 IMPLANT

## 2016-08-04 NOTE — Evaluation (Signed)
Physical Therapy Evaluation Patient Details Name: [name] MRN: [id number] DOB: [birth year] Today's Date: 08/04/2016   History of Present Illness  Pt is 76 y/o male s/p elective L TKA, direct anterior approach. Pt with HR  in 20-30, however, when cardiology consulted, reports that reading was an error and cleared for participation. PMH includes MI s/p stent placement and L heart cath with graft, a fib s/p cardioversion, HTN, s/p L shoulder replacement, CHF, CKD, and CAD.   Clinical Impression  Pt is s/p surgery above with deficits below. PTA, pt was independent with functional mobility. Upon evaluation, pt limited by post op pain and weakness, as well as, slightly decreased balance. Pt requiring supervision to min guard assist for mobility this session. Will have necessary assist at home and has all DME. Follow up recommendations per MD arrangements. Will continue to follow acutely to maximize functional mobility independence.     Follow Up Recommendations DC plan and follow up therapy as arranged by surgeon;Supervision for mobility/OOB    Equipment Recommendations  None recommended by PT    Recommendations for Other Services       Precautions / Restrictions Precautions Precautions: None Precaution Comments: Reviewed supine ther ex handout with pt Restrictions Weight Bearing Restrictions: Yes LLE Weight Bearing: Weight bearing as tolerated      Mobility  Bed Mobility Overal bed mobility: Needs Assistance Bed Mobility: Supine to Sit     Supine to sit: Supervision;HOB elevated     General bed mobility comments: Supervision for safety. Elevated HOB and bed rails required.   Transfers Overall transfer level: Needs assistance Equipment used: Rolling walker (2 wheeled) Transfers: Sit to/from Stand Sit to Stand: Min guard         General transfer comment: Min guard for safety. Verbal cues for safe hand placement.   Ambulation/Gait Ambulation/Gait assistance: Min  guard Ambulation Distance (Feet): 50 Feet Assistive device: Rolling walker (2 wheeled) Gait Pattern/deviations: Step-through pattern;Decreased step length - left;Decreased weight shift to left;Trunk flexed Gait velocity: Decreased Gait velocity interpretation: Below normal speed for age/gender General Gait Details: Slow, antalgic gait secondary to post op pain and weakness. Verbal cues for sequencing with RW. Overall, fairly steady with no LOB noted. Verbal cues to look ahead and for upright posture.   Stairs            Wheelchair Mobility    Modified Rankin (Stroke Patients Only)       Balance Overall balance assessment: Needs assistance Sitting-balance support: No upper extremity supported;Feet supported Sitting balance-Leahy Scale: Good     Standing balance support: Bilateral upper extremity supported;During functional activity Standing balance-Leahy Scale: Poor Standing balance comment: Reliant on RW for stability                              Pertinent Vitals/Pain Pain Assessment: 0-10 Pain Score: 4  Pain Location: L hip  Pain Descriptors / Indicators: Aching;Operative site guarding;Sore Pain Intervention(s): Monitored during session;Limited activity within patient's tolerance;Repositioned    Home Living Family/patient expects to be discharged to:: Private residence Living Arrangements: Spouse/significant other;Children Available Help at Discharge: Family;Available 24 hours/day Type of Home: House Home Access: Stairs to enter Entrance Stairs-Rails: None Entrance Stairs-Number of Steps: 1 Home Layout: One level Home Equipment: Walker - 2 wheels;Cane - single point;Bedside commode;Shower seat;Hand held shower head      Prior Function Level of Independence: Independent         Comments: Reports  he would practice with RW      Hand Dominance   Dominant Hand: Left    Extremity/Trunk Assessment   Upper Extremity Assessment Upper Extremity  Assessment: Overall WFL for tasks assessed    Lower Extremity Assessment Lower Extremity Assessment: LLE deficits/detail LLE Deficits / Details: Senosry in tact. Able to perform exercise below. Deficits consistent with post op pain and weakness.     Cervical / Trunk Assessment Cervical / Trunk Assessment: Normal  Communication   Communication: No difficulties  Cognition Arousal/Alertness: Awake/alert Behavior During Therapy: WFL for tasks assessed/performed Overall Cognitive Status: Within Functional Limits for tasks assessed                                        General Comments      Exercises Total Joint Exercises Ankle Circles/Pumps: AROM;Both;10 reps;Supine Quad Sets: AROM;Left;10 reps;Supine Short Arc Quad: AROM;Left;10 reps;Supine Heel Slides: AROM;Left;10 reps;Supine Hip ABduction/ADduction: AROM;Left;10 reps;Supine   Assessment/Plan    PT Assessment Patient needs continued PT services  PT Problem List Decreased strength;Decreased range of motion;Decreased balance;Decreased mobility;Decreased knowledge of use of DME;Pain       PT Treatment Interventions DME instruction;Functional mobility training;Stair training;Gait training;Therapeutic activities;Therapeutic exercise;Balance training;Neuromuscular re-education;Patient/family education    PT Goals (Current goals can be found in the Care Plan section)  Acute Rehab PT Goals Patient Stated Goal: to go home tomorrow  PT Goal Formulation: With patient Time For Goal Achievement: 08/11/16 Potential to Achieve Goals: Good    Frequency 7X/week   Barriers to discharge        Co-evaluation               AM-PAC PT "6 Clicks" Daily Activity  Outcome Measure Difficulty turning over in bed (including adjusting bedclothes, sheets and blankets)?: A Little Difficulty moving from lying on back to sitting on the side of the bed? : A Little Difficulty sitting down on and standing up from a chair with  arms (e.g., wheelchair, bedside commode, etc,.)?: Total Help needed moving to and from a bed to chair (including a wheelchair)?: A Little Help needed walking in hospital room?: A Little Help needed climbing 3-5 steps with a railing? : A Little 6 Click Score: 16    End of Session Equipment Utilized During Treatment: Gait belt Activity Tolerance: Patient tolerated treatment well Patient left: in chair;with call bell/phone within reach Nurse Communication: Mobility status PT Visit Diagnosis: Other abnormalities of gait and mobility (R26.89);Pain Pain - Right/Left: Left Pain - part of body: Hip    Time: 1715-1739 PT Time Calculation (min) (ACUTE ONLY): 24 min   Charges:   PT Evaluation $PT Eval Low Complexity: 1 Procedure PT Treatments $Gait Training: 8-22 mins   PT G Codes:        Gladys Damme, PT, DPT  Acute Rehabilitation Services  Pager: [phone]   [name] 08/04/2016, 6:32 PM

## 2016-08-04 NOTE — Anesthesia Postprocedure Evaluation (Signed)
Anesthesia Post Note  Patient: [name]  Procedure(s) Performed: Procedure(s) (LRB): LEFT TOTAL HIP ARTHROPLASTY ANTERIOR APPROACH (Left)     Patient location during evaluation: PACU Anesthesia Type: Spinal Level of consciousness: oriented and awake and alert Pain management: pain level controlled Vital Signs Assessment: post-procedure vital signs reviewed and stable Respiratory status: spontaneous breathing, respiratory function stable and patient connected to nasal cannula oxygen Cardiovascular status: blood pressure returned to baseline and stable Postop Assessment: no headache and no backache Anesthetic complications: no    Last Vitals:  Vitals:   08/04/16 1400 08/04/16 2036  BP: (!) 138/57 (!) 154/58  Pulse: (!) 32 60  Resp: 16 18  Temp: 36.3 C 36.4 C    Last Pain:  Vitals:   08/04/16 2036  TempSrc: Oral  PainSc:                  Catheryn Bacon Ellender

## 2016-08-04 NOTE — OR Nursing (Signed)
1025: in&out cath=50cc concentrated urine per protocol, no trauma

## 2016-08-04 NOTE — Anesthesia Procedure Notes (Signed)
Date/Time: 08/04/2016 7:45 AM Performed by: Coralee Rud Pre-anesthesia Checklist: Patient identified, Emergency Drugs available, Suction available and Patient being monitored Patient Re-evaluated:Patient Re-evaluated prior to inductionOxygen Delivery Method: Simple face mask Placement Confirmation: positive ETCO2

## 2016-08-04 NOTE — Op Note (Signed)
OPERATIVE REPORT  SURGEON: Samson Frederic, MD   ASSISTANT: Hart Carwin, RNFA.  PREOPERATIVE DIAGNOSIS: Left hip arthritis.   POSTOPERATIVE DIAGNOSIS: Left hip arthritis.   PROCEDURE: Left total hip arthroplasty, anterior approach.   IMPLANTS: Biomet Taperloc Complete Microplasty stem, size 18 x 121, hi offset. Biomet G7 Cup, size 58 mm. Biomet E1 liner, size 36 mm, G, neutral. Biomet Bioolox ceramic head ball, size 36 + 0 mm.  ANESTHESIA:  Spinal  ESTIMATED BLOOD LOSS: 300 mL.    ANTIBIOTICS: 2 g Ancef.  DRAINS: None.  COMPLICATIONS: None.   CONDITION: PACU - hemodynamically stable.   BRIEF CLINICAL NOTE: [name] is a 76 y.o. male with a long-standing history of Left hip arthritis. After failing conservative management, the patient was indicated for total hip arthroplasty. The risks, benefits, and alternatives to the procedure were explained, and the patient elected to proceed.  PROCEDURE IN DETAIL: Surgical site was marked by myself in the pre-op holding area. Once inside the operating room, spinal anesthesia was obtained, and a foley catheter was inserted. The patient was then positioned on the Hana table. All bony prominences were well padded. The hip was prepped and draped in the normal sterile surgical fashion. A time-out was called verifying side and site of surgery. The patient received IV antibiotics within 60 minutes of beginning the procedure.  The direct anterior approach to the hip was performed through the Hueter interval. Lateral femoral circumflex vessels were treated with the Auqumantys. The anterior capsule was exposed and an inverted T capsulotomy was made.The femoral neck cut was made to the level of the templated cut. A corkscrew was placed into the head and the head was removed. The femoral head was found to have eburnated bone. The head was passed to the back table and was measured.  Acetabular exposure was achieved, and the pulvinar  and labrum were excised. Sequential reaming of the acetabulum was then performed up to a size 57 mm reamer. A 58 mm cup was then opened and impacted into place at approximately 40 degrees of abduction and 20 degrees of anteversion. The final polyethylene liner was impacted into place and acetabular osteophytes were removed.   I then gained femoral exposure taking care to protect the abductors and greater trochanter. This was performed using standard external rotation, extension, and adduction. The capsule was peeled off the inner aspect of the greater trochanter, taking care to preserve the short external rotators. A cookie cutter was used to enter the femoral canal, and then the femoral canal finder was placed. Sequential broaching was performed up to a size 18. Calcar planer was used on the femoral neck remnant. I placed a hi offset neck and a trial head ball. The hip was reduced. Leg lengths and offset were checked fluoroscopically. The hip was dislocated and trial components were removed. The final implants were placed, and the hip was reduced.  Fluoroscopy was used to confirm component position and leg lengths. At 90 degrees of external rotation and full extension, the hip was stable to an anterior directed force.  The wound was copiously irrigated with normal saline using pulse lavage. Marcaine solution was injected into the periarticular soft tissue. The wound was closed in layers using #1 Vicryl and V-Loc for the fascia, 2-0 Vicryl for the subcutaneous fat, 2-0 Monocryl for the deep dermal layer, 3-0 running Monocryl subcuticular stitch, and Dermabond for the skin. Once the glue was fully dried, an Aquacell Ag dressing was applied. The patient was transported to  the recovery room in stable condition. Sponge, needle, and instrument counts were correct at the end of the case x2. The patient tolerated the procedure well and there were no known complications.

## 2016-08-04 NOTE — Anesthesia Procedure Notes (Signed)
Spinal  Patient location during procedure: OR Start time: 08/04/2016 7:35 AM End time: 08/04/2016 7:45 AM Staffing Anesthesiologist: Karna Christmas P Performed: anesthesiologist  Preanesthetic Checklist Completed: patient identified, surgical consent, pre-op evaluation, timeout performed, IV checked, risks and benefits discussed and monitors and equipment checked Spinal Block Patient position: sitting Prep: DuraPrep Patient monitoring: cardiac monitor, continuous pulse ox and blood pressure Approach: left paramedian Location: L4-5 Injection technique: single-shot Needle Needle type: Quincke  Needle gauge: 22 G Needle length: 9 cm Assessment Sensory level: T10 Additional Notes Functioning IV was confirmed and monitors were applied. Sterile prep and drape, including hand hygiene and sterile gloves were used. The patient was positioned and the spine was prepped. The skin was anesthetized with lidocaine.  Free flow of clear CSF was obtained prior to injecting local anesthetic into the CSF.  The spinal needle aspirated freely following injection.  The needle was carefully withdrawn.  The patient tolerated the procedure well.

## 2016-08-04 NOTE — Progress Notes (Addendum)
EKG reveals PVC with compensatory pauces and hence HR of 20 -40 is an error. Patient asymptomatic. Continue Telemetry for 24 hours and if no significant arrhythmias or heart block, discontinue and manage per orthopedics.   EKG 08/04/2016: Normal sinus rhythm at the rate of 54-75 bpm with sinus arrhythmia, possible left atrial abnormality, PVCs, nonspecific T abnormality, cannot exclude ischemia.  Compared to 12/29/13, no significant change.   [name] Decamp, MD 08/04/2016, 3:39 PM [facility] Pager: [phone] Office: [phone] If no answer: Cell:  [phone]

## 2016-08-04 NOTE — H&P (View-Only) (Signed)
TOTAL HIP ADMISSION H&P  Patient is admitted for left total hip arthroplasty.  Subjective:  Chief Complaint: left hip pain  HPI: [name], 76 y.o. male, has a history of pain and functional disability in the left hip(s) due to arthritis and patient has failed non-surgical conservative treatments for greater than 12 weeks to include NSAID's and/or analgesics, flexibility and strengthening excercises, use of assistive devices, weight reduction as appropriate and activity modification.  Onset of symptoms was gradual starting 2 years ago with gradually worsening course since that time.The patient noted no past surgery on the left hip(s).  Patient currently rates pain in the left hip at 10 out of 10 with activity. Patient has night pain, worsening of pain with activity and weight bearing, trendelenberg gait, pain that interfers with activities of daily living, pain with passive range of motion and crepitus. Patient has evidence of subchondral cysts, subchondral sclerosis and joint space narrowing by imaging studies. This condition presents safety issues increasing the risk of falls. There is no current active infection.  Patient Active Problem List   Diagnosis Date Noted  . S/P shoulder replacement, left 12/29/2015  . Paroxysmal atrial fibrillation (HCC) 01/21/2014  . Severe obstructive sleep apnea 01/21/2014  . Long-term (current) use of anticoagulants 11/17/2013  . Acute on chronic diastolic congestive heart failure (HCC) 11/09/2013  . Atrial fibrillation (HCC) 11/08/2013  . Long term current use of anticoagulant therapy 11/03/2013  . Anemia 07/31/2013  . Coronary artery disease   . GERD (gastroesophageal reflux disease)   . Pre-syncope   . History of class III angina pectoris 07/28/2013  . Arthritis 04/05/2013  . Hyperlipidemia with target LDL less than 70 10/11/2012  . Barrett's esophagus   . Diabetes mellitus (HCC) 08/25/2008  . Hypertensive heart disease   . Sleep apnea in adult     Past Medical History:  Diagnosis Date  . Arthritis   . Atrial fibrillation (HCC)    successful cardioversion 12/2013  . Barrett's esophagus   . Chronic kidney disease    CKD  . Coronary artery disease    a. h/o CABG 1997. b. stenting to graft-RCA, CB to LAD after LIMA in 2006. c. stenting to VG-RCA beyond initial stent and in-stent restenosis in 2011. c. Complex PCI 07/2013 to SVG-RCA and distal native RCA involving 4 sites (see cath report).  . Diabetes (HCC)    Type II  . Dyspnea    with exertion  . GERD (gastroesophageal reflux disease)   . History of kidney stones   . Hyperlipidemia   . Hypertension   . Myocardial infarction   . Pre-syncope    a. 09/2012: in the setting of mild dehydration with significant straining possibly inducing a vagal event while he was dragging heavy logs.   . Sleep apnea    hx of     Past Surgical History:  Procedure Laterality Date  . CARDIOVERSION N/A 12/29/2013   Procedure: CARDIOVERSION;  Surgeon: [name] S McLean, MD;  Location: [facility];  Service: Cardiovascular;  Laterality: N/A;  . COLONOSCOPY W/ POLYPECTOMY    . CORONARY ANGIOPLASTY WITH STENT PLACEMENT    . CORONARY ARTERY BYPASS GRAFT    . CORONARY STENT PLACEMENT  07/30/2013   DES to RCA     DR KELLY  . HERNIA REPAIR Left    Inguinal  . JOINT REPLACEMENT Bilateral    Shoulders  . KIDNEY STONE SURGERY    . LEFT HEART CATHETERIZATION WITH CORONARY/GRAFT ANGIOGRAM N/A 07/30/2013   Procedure:   LEFT HEART CATHETERIZATION WITH CORONARY/GRAFT ANGIOGRAM;  Surgeon: Thomas A Kelly, MD;  Location: [facility];  Service: Cardiovascular;  Laterality: N/A;  . LITHOTRIPSY    . NEPHROLITHOTOMY     kidney stone  . PERCUTANEOUS CORONARY INTERVENTION-BALLOON ONLY  07/30/2013   Procedure: PERCUTANEOUS CORONARY INTERVENTION-BALLOON ONLY;  Surgeon: Thomas A Kelly, MD;  Location: [facility];  Service: Cardiovascular;;  . PERCUTANEOUS CORONARY STENT INTERVENTION (PCI-S)  07/30/2013   Procedure:  PERCUTANEOUS CORONARY STENT INTERVENTION (PCI-S);  Surgeon: Thomas A Kelly, MD;  Location: [facility];  Service: Cardiovascular;;  . SHOULDER SURGERY Bilateral   . TOTAL SHOULDER REVISION Left 12/29/2015   Procedure: TOTAL SHOULDER REVISION ARHTROPLASTY;  Surgeon: Steve Norris, MD;  Location: [facility] OR;  Service: Orthopedics;  Laterality: Left;  . UPPER GI ENDOSCOPY       (Not in a hospital admission) Allergies  Allergen Reactions  . Metformin And Related Nausea And Vomiting    Dry Heaves  . Ivp Dye [Iodinated Diagnostic Agents] Other (See Comments)    Increased blood pressure, heart rate    Social History  Substance Use Topics  . Smoking status: Former Smoker    Years: 32.00  . Smokeless tobacco: Never Used  . Alcohol use Yes     Comment: 2-3 beers a month.    Family History  Problem Relation Age of Onset  . Heart attack Brother   . Heart disease Brother   . Diabetes Mother   . Heart disease Mother   . Hypertension Mother   . Cancer Father   . Diabetes Sister   . Hypertension Sister   . Heart disease Brother   . Diabetes Brother   . Colon cancer Neg Hx      Review of Systems  Constitutional: Positive for malaise/fatigue.  HENT: Positive for hearing loss.   Eyes: Negative.   Respiratory: Negative.   Cardiovascular: Negative.   Gastrointestinal: Negative.   Genitourinary: Negative.   Musculoskeletal: Positive for back pain and joint pain.  Skin: Negative.   Neurological: Negative.   Endo/Heme/Allergies: Negative.   Psychiatric/Behavioral: Negative.     Objective:  Physical Exam  Vitals reviewed. Constitutional: He is oriented to person, place, and time. He appears well-developed and well-nourished.  HENT:  Head: Normocephalic and atraumatic.  Eyes: Conjunctivae and EOM are normal. Pupils are equal, round, and reactive to light.  Neck: Normal range of motion. Neck supple.  Cardiovascular: Normal rate, regular rhythm and intact distal pulses.   Respiratory:  Effort normal and breath sounds normal.  GI: Soft. He exhibits no distension.  Genitourinary:  Genitourinary Comments: deferred  Musculoskeletal:       Left hip: He exhibits decreased range of motion, decreased strength, bony tenderness and crepitus.  Neurological: He is alert and oriented to person, place, and time. He has normal reflexes.  Skin: Skin is warm.  Psychiatric: He has a normal mood and affect. His behavior is normal. Judgment and thought content normal.    Vital signs in last 24 hours: @VSRANGES@  Labs:   Estimated body mass index is 25.55 kg/m as calculated from the following:   Height as of 12/27/15: 5' 11" (1.803 m).   Weight as of 12/27/15: 83.1 kg (183 lb 3.2 oz).   Imaging Review Plain radiographs demonstrate severe degenerative joint disease of the left hip(s). The bone quality appears to be adequate for age and reported activity level.  Assessment/Plan:  End stage arthritis, left hip(s)  The patient history, physical examination, clinical judgement   of the provider and imaging studies are consistent with end stage degenerative joint disease of the left hip(s) and total hip arthroplasty is deemed medically necessary. The treatment options including medical management, injection therapy, arthroscopy and arthroplasty were discussed at length. The risks and benefits of total hip arthroplasty were presented and reviewed. The risks due to aseptic loosening, infection, stiffness, dislocation/subluxation,  thromboembolic complications and other imponderables were discussed.  The patient acknowledged the explanation, agreed to proceed with the plan and consent was signed. Patient is being admitted for inpatient treatment for surgery, pain control, PT, OT, prophylactic antibiotics, VTE prophylaxis, progressive ambulation and ADL's and discharge planning.The patient is planning to be discharged home with HEP 

## 2016-08-04 NOTE — Anesthesia Preprocedure Evaluation (Addendum)
Anesthesia Evaluation  Patient identified by MRN, date of birth, ID band Patient awake    Reviewed: Allergy & Precautions, H&P , Patient's Chart, lab work & pertinent test results, reviewed documented beta blocker date and time   Airway Mallampati: II  TM Distance: >3 FB Neck ROM: full    Dental no notable dental hx.    Pulmonary sleep apnea , former smoker,    Pulmonary exam normal breath sounds clear to auscultation       Cardiovascular hypertension, Pt. on home beta blockers and Pt. on medications + CAD, + Past MI, + Cardiac Stents, + CABG and +CHF  + dysrhythmias Atrial Fibrillation  Rhythm:regular Rate:Normal  ECG: SB, rate 50  ECHO: - The patient was in atrial fibrillation. Technically difficult study with poor acoustic windows. Normal LV size with mild LV hypertrophy. EF 55% with basal inferior severe hypokinesis. Normal RV size and systolic function. No significant valvular abnormalities.   Cardiologist is Delrae Rend, MD, last office visit 08/27/15. Follow up in 1 year recommended.    Neuro/Psych    GI/Hepatic GERD  Medicated,  Endo/Other  diabetes, Type 2, Oral Hypoglycemic Agents  Renal/GU      Musculoskeletal   Abdominal   Peds  Hematology  (+) anemia ,   Anesthesia Other Findings Hyperlipidemia  INR: 1.2  Reproductive/Obstetrics                            Anesthesia Physical  Anesthesia Plan  ASA: III  Anesthesia Plan: Spinal   Post-op Pain Management:    Induction: Intravenous  PONV Risk Score and Plan: 1 and Ondansetron, Dexamethasone and Propofol  Airway Management Planned:   Additional Equipment:   Intra-op Plan:   Post-operative Plan:   Informed Consent: I have reviewed the patients History and Physical, chart, labs and discussed the procedure including the risks, benefits and alternatives for the proposed anesthesia with the patient or authorized  representative who has indicated his/her understanding and acceptance.   Dental Advisory Given and Dental advisory given  Plan Discussed with: CRNA and Surgeon  Anesthesia Plan Comments:         Anesthesia Quick Evaluation

## 2016-08-04 NOTE — Transfer of Care (Signed)
Immediate Anesthesia Transfer of Care Note  Patient: [name]  Procedure(s) Performed: Procedure(s): LEFT TOTAL HIP ARTHROPLASTY ANTERIOR APPROACH (Left)  Patient Location: PACU  Anesthesia Type:Spinal  Level of Consciousness: awake, alert , oriented and patient cooperative  Airway & Oxygen Therapy: Patient Spontanous Breathing and Patient connected to nasal cannula oxygen  Post-op Assessment: Report given to RN, Post -op Vital signs reviewed and stable, Patient moving all extremities and Patient moving all extremities X 4  Post vital signs: Reviewed and stable  Last Vitals:  Vitals:   08/04/16 0624  BP: (!) 111/40  Pulse: (!) 51  Resp: 18  Temp: 36.6 C    Last Pain:  Vitals:   08/04/16 0624  TempSrc: Oral      Patients Stated Pain Goal: 5 (08/04/16 0647)  Complications: No apparent anesthesia complications

## 2016-08-04 NOTE — Interval H&P Note (Signed)
History and Physical Interval Note:  08/04/2016 7:19 AM  [name]  has presented today for surgery, with the diagnosis of Degenerative joint disease, left hip  The various methods of treatment have been discussed with the patient and family. After consideration of risks, benefits and other options for treatment, the patient has consented to  Procedure(s): LEFT TOTAL HIP ARTHROPLASTY ANTERIOR APPROACH (Left) as a surgical intervention .  The patient's history has been reviewed, patient examined, no change in status, stable for surgery.  I have reviewed the patient's chart and labs.  Questions were answered to the patient's satisfaction.     Ladona Rosten, Cloyde Reams

## 2016-08-04 NOTE — Discharge Instructions (Signed)
°Dr. Nikitia Asbill °Joint Replacement Specialist °[facility] °[address] °[place],  [address] °[phone] ° ° °TOTAL HIP REPLACEMENT POSTOPERATIVE DIRECTIONS ° ° ° °Hip Rehabilitation, Guidelines Following Surgery  ° °WEIGHT BEARING °Weight bearing as tolerated with assist device (walker, cane, etc) as directed, use it as long as suggested by your surgeon or therapist, typically at least 4-6 weeks. ° °The results of a hip operation are greatly improved after range of motion and muscle strengthening exercises. Follow all safety measures which are given to protect your hip. If any of these exercises cause increased pain or swelling in your joint, decrease the amount until you are comfortable again. Then slowly increase the exercises. Call your caregiver if you have problems or questions.  ° °HOME CARE INSTRUCTIONS  °Most of the following instructions are designed to prevent the dislocation of your new hip.  °Remove items at home which could result in a fall. This includes throw rugs or furniture in walking pathways.  °Continue medications as instructed at time of discharge. °· You may have some home medications which will be placed on hold until you complete the course of blood thinner medication. °· You may start showering once you are discharged home. Do not remove your dressing. °Do not put on socks or shoes without following the instructions of your caregivers.   °Sit on chairs with arms. Use the chair arms to help push yourself up when arising.  °Arrange for the use of a toilet seat elevator so you are not sitting low.  °· Walk with walker as instructed.  °You may resume a sexual relationship in one month or when given the OK by your caregiver.  °Use walker as long as suggested by your caregivers.  °You may put full weight on your legs and walk as much as is comfortable. °Avoid periods of inactivity such as sitting longer than an hour when not asleep. This helps prevent  blood clots.  °You may return to work once you are cleared by your surgeon.  °Do not drive a car for 6 weeks or until released by your surgeon.  °Do not drive while taking narcotics.  °Wear elastic stockings for two weeks following surgery during the day but you may remove then at night.  °Make sure you keep all of your appointments after your operation with all of your doctors and caregivers. You should call the office at the above phone number and make an appointment for approximately two weeks after the date of your surgery. °Please pick up a stool softener and laxative for home use as long as you are requiring pain medications. °· ICE to the affected hip every three hours for 30 minutes at a time and then as needed for pain and swelling. Continue to use ice on the hip for pain and swelling from surgery. You may notice swelling that will progress down to the foot and ankle.  This is normal after surgery.  Elevate the leg when you are not up walking on it.   °It is important for you to complete the blood thinner medication as prescribed by your doctor. °· Continue to use the breathing machine which will help keep your temperature down.  It is common for your temperature to cycle up and down following surgery, especially at night when you are not up moving around and exerting yourself.  The breathing machine keeps your lungs expanded and your temperature down. ° °RANGE OF MOTION AND STRENGTHENING EXERCISES  °These exercises are   designed to help you keep full movement of your hip joint. Follow your caregiver's or physical therapist's instructions. Perform all exercises about fifteen times, three times per day or as directed. Exercise both hips, even if you have had only one joint replacement. These exercises can be done on a training (exercise) mat, on the floor, on a table or on a bed. Use whatever works the best and is most comfortable for you. Use music or television while you are exercising so that the exercises  are a pleasant break in your day. This will make your life better with the exercises acting as a break in routine you can look forward to.  Lying on your back, slowly slide your foot toward your buttocks, raising your knee up off the floor. Then slowly slide your foot back down until your leg is straight again.  Lying on your back spread your legs as far apart as you can without causing discomfort.  Lying on your side, raise your upper leg and foot straight up from the floor as far as is comfortable. Slowly lower the leg and repeat.  Lying on your back, tighten up the muscle in the front of your thigh (quadriceps muscles). You can do this by keeping your leg straight and trying to raise your heel off the floor. This helps strengthen the largest muscle supporting your knee.  Lying on your back, tighten up the muscles of your buttocks both with the legs straight and with the knee bent at a comfortable angle while keeping your heel on the floor.   SKILLED REHAB INSTRUCTIONS: If the patient is transferred to a skilled rehab facility following release from the hospital, a list of the current medications will be sent to the facility for the patient to continue.  When discharged from the skilled rehab facility, please have the facility set up the patient's Home Health Physical Therapy prior to being released. Also, the skilled facility will be responsible for providing the patient with their medications at time of release from the facility to include their pain medication and their blood thinner medication. If the patient is still at the rehab facility at time of the two week follow up appointment, the skilled rehab facility will also need to assist the patient in arranging follow up appointment in our office and any transportation needs.  MAKE SURE YOU:  Understand these instructions.  Will watch your condition.  Will get help right away if you are not doing well or get worse.  Pick up stool softner and  laxative for home use following surgery while on pain medications. Do not remove your dressing. The dressing is waterproof--it is OK to take showers. Continue to use ice for pain and swelling after surgery. Do not use any lotions or creams on the incision until instructed by your surgeon. Total Hip Protocol.  RESUME NORMAL COUMADIN DOSE. Daily lovenox injections until INR is greater than 1.8 Please see your Primary Care Doctor on Monday (6/25) for an INR check.

## 2016-08-04 NOTE — Progress Notes (Signed)
Called by RN regarding bradycardia. Patient HR 20s-40s & asymptomatic. BP stable, O2 sats stable. I spoke with the patient's cardiologist, Dr. Jacinto Halim who will evaluated the patient. Will place him on telemetry now.

## 2016-08-04 NOTE — Progress Notes (Signed)
Following prep and clip for surgery, patient developed red area on anterior hip.  Dr. Linna Caprice made aware

## 2016-08-04 NOTE — Progress Notes (Signed)
ANTICOAGULATION CONSULT NOTE - Initial Consult  Pharmacy Consult for warfarin Indication: atrial fibrillation and VTE prophylaxis  Allergies  Allergen Reactions  . Ivp Dye [Iodinated Diagnostic Agents] Other (See Comments)    Increased blood pressure, heart rate    Patient Measurements: Height: 5\' 11"  (180.3 cm) Weight: 151 lb (68.5 kg) IBW/kg (Calculated) : 75.3  Vital Signs: Temp: 97 F (36.1 C) (06/22 1235) Temp Source: Oral (06/22 0624) BP: 132/50 (06/22 1305) Pulse Rate: 27 (06/22 1305)  Labs:  Recent Labs  08/04/16 0645  LABPROT 15.3*  INR 1.20    Estimated Creatinine Clearance: 47.2 mL/min (A) (by C-G formula based on SCr of 1.29 mg/dL (H)).   Medical History: Past Medical History:  Diagnosis Date  . Arthritis   . Atrial fibrillation (HCC)    successful cardioversion 12/2013  . Barrett's esophagus   . Chronic kidney disease    CKD  . Coronary artery disease    a. h/o CABG 1997. b. stenting to graft-RCA, CB to LAD after LIMA in 2006. c. stenting to VG-RCA beyond initial stent and in-stent restenosis in 2011. c. Complex PCI 07/2013 to SVG-RCA and distal native RCA involving 4 sites (see cath report).  . Diabetes (HCC)    Type II  . Dyspnea    with exertion  . GERD (gastroesophageal reflux disease)   . Heart murmur   . History of kidney stones   . Hyperlipidemia   . Hypertension   . Myocardial infarction (HCC)   . Pre-syncope    a. 09/2012: in the setting of mild dehydration with significant straining possibly inducing a vagal event while he was dragging heavy logs.   . Sleep apnea    hx of does not use cpap for yr has lost wt    Medications:  Prescriptions Prior to Admission  Medication Sig Dispense Refill Last Dose  . acetaminophen (TYLENOL) 500 MG tablet Take 1,000 mg by mouth 2 (two) times daily as needed for moderate pain or headache.   Past Month at Unknown time  . atorvastatin (LIPITOR) 10 MG tablet Take 10 mg by mouth at bedtime.   08/03/2016  at Unknown time  . carvedilol (COREG) 25 MG tablet Take 25 mg by mouth 2 (two) times daily.    08/04/2016 at 0430  . digoxin (LANOXIN) 0.125 MG tablet Take 1 tablet (0.125 mg total) by mouth daily. 30 tablet 6 08/04/2016 at 0430  . diltiazem (CARDIZEM CD) 120 MG 24 hr capsule Take 1 capsule (120 mg total) by mouth 2 (two) times daily before a meal. 60 capsule 5 08/04/2016 at 0430  . glimepiride (AMARYL) 2 MG tablet Take 2 mg by mouth 2 (two) times daily before a meal.    08/03/2016 at Unknown time  . hydrALAZINE (APRESOLINE) 50 MG tablet Take 50 mg by mouth 3 (three) times daily.   08/04/2016 at 0430  . metFORMIN (GLUCOPHAGE-XR) 500 MG 24 hr tablet Take 500 mg by mouth 2 (two) times daily with a meal.   08/03/2016 at Unknown time  . pantoprazole (PROTONIX) 40 MG tablet Take 40 mg by mouth 2 (two) times daily before a meal.   08/04/2016 at 0430  . valsartan (DIOVAN) 160 MG tablet Take 80 mg by mouth at bedtime.   08/03/2016 at Unknown time  . warfarin (COUMADIN) 5 MG tablet Take 1 tablet (5 mg total) by mouth daily. (Patient taking differently: Take 2.5-5 mg by mouth daily at 6 PM. 5mg  daily except tues and thurs which is 2.5mg )  90 tablet 3 07/30/2016  . nitroGLYCERIN (NITROSTAT) 0.4 MG SL tablet Place 0.4 mg under the tongue every 5 (five) minutes as needed for chest pain.   More than a month at Unknown time  . traMADol (ULTRAM) 50 MG tablet Take 50 mg by mouth 2 (two) times daily as needed for pain.  0 More than a month at Unknown time    Assessment: 76 y/o male s/p left THA on warfarin PTA for Afib. Pharmacy consulted to manage inpatient.  INR 1.2 and subtherapeutic today. No bleeding noted, no CBC available.  PTA: 5 mg daily exc 2.5 mg T/Th  Goal of Therapy:  INR 2-3 Monitor platelets by anticoagulation protocol: Yes   Plan:  Warfarin 7.5 mg PO tonight Daily INR D/c enoxaparin when INR >= 1.8 Monitor for s/sx of bleeding   Loura Back, PharmD, BCPS Clinical Pharmacist Phone for today  [phone] Main pharmacy - [phone] 08/04/2016 2:00 PM

## 2016-08-05 LAB — CBC
HCT: 32.7 % — ABNORMAL LOW (ref 39.0–52.0)
Hemoglobin: 10.8 g/dL — ABNORMAL LOW (ref 13.0–17.0)
MCH: 30.5 pg (ref 26.0–34.0)
MCHC: 33 g/dL (ref 30.0–36.0)
MCV: 92.4 fL (ref 78.0–100.0)
PLATELETS: 228 10*3/uL (ref 150–400)
RBC: 3.54 MIL/uL — AB (ref 4.22–5.81)
RDW: 13 % (ref 11.5–15.5)
WBC: 10.7 10*3/uL — ABNORMAL HIGH (ref 4.0–10.5)

## 2016-08-05 LAB — PROTIME-INR
INR: 1.24
PROTHROMBIN TIME: 15.6 s — AB (ref 11.4–15.2)

## 2016-08-05 LAB — BASIC METABOLIC PANEL
Anion gap: 9 (ref 5–15)
BUN: 24 mg/dL — AB (ref 6–20)
CALCIUM: 8.9 mg/dL (ref 8.9–10.3)
CO2: 24 mmol/L (ref 22–32)
CREATININE: 1.24 mg/dL (ref 0.61–1.24)
Chloride: 102 mmol/L (ref 101–111)
GFR, EST NON AFRICAN AMERICAN: 55 mL/min — AB (ref >60)
Glucose, Bld: 138 mg/dL — ABNORMAL HIGH (ref 65–99)
Potassium: 4.5 mmol/L (ref 3.5–5.1)
SODIUM: 135 mmol/L (ref 135–145)

## 2016-08-05 LAB — GLUCOSE, CAPILLARY: Glucose-Capillary: 137 mg/dL — ABNORMAL HIGH (ref 65–99)

## 2016-08-05 MED ORDER — WARFARIN SODIUM 7.5 MG PO TABS: 7.5000 mg | ORAL_TABLET | Freq: Once | ORAL | Status: DC

## 2016-08-05 NOTE — Progress Notes (Signed)
Reviewed discharge instruction/medications with patient and family.  Showed how to administer Lovenox and reviewed with him.  Signed off on PT. Patient is stable and ready for discharge.

## 2016-08-05 NOTE — Progress Notes (Signed)
    Subjective: 1 Day Post-Op Procedure(s) (LRB): LEFT TOTAL HIP ARTHROPLASTY ANTERIOR APPROACH (Left) Patient reports pain as 2 on 0-10 scale.   Denies CP or SOB.  Voiding without difficulty. Positive flatus. Objective: Vital signs in last 24 hours: Temp:  [97 F (36.1 C)-98.3 F (36.8 C)] 98.3 F (36.8 C) (06/23 0504) Pulse Rate:  [27-77] 77 (06/23 0504) Resp:  [13-20] 18 (06/23 0504) BP: (94-176)/(43-80) 176/68 (06/23 0504) SpO2:  [95 %-100 %] 95 % (06/23 0504)  Intake/Output from previous day: 06/22 0701 - 06/23 0700 In: -  Out: 1050 [Urine:750; Blood:300] Intake/Output this shift: No intake/output data recorded.  Labs:  Recent Labs  08/04/16 1420 08/05/16 0444  HGB 10.2* 10.8*    Recent Labs  08/04/16 1420 08/05/16 0444  WBC 14.7* 10.7*  RBC 3.38* 3.54*  HCT 31.8* 32.7*  PLT 222 228    Recent Labs  08/04/16 1420 08/05/16 0444  NA  --  135  K  --  4.5  CL  --  102  CO2  --  24  BUN  --  24*  CREATININE 1.29* 1.24  GLUCOSE  --  138*  CALCIUM  --  8.9    Recent Labs  08/04/16 0645 08/05/16 0444  INR 1.20 1.24    Physical Exam: Neurologically intact ABD soft Sensation intact distally Incision: dressing C/D/I Compartment soft  Assessment/Plan: 1 Day Post-Op Procedure(s) (LRB): LEFT TOTAL HIP ARTHROPLASTY ANTERIOR APPROACH (Left) Advance diet Up with therapy  Doing well - ok for d/c to home   Raequan Vanschaick D for Dr. Venita Lick [facility] [phone] 08/05/2016, 7:35 AM

## 2016-08-05 NOTE — Progress Notes (Signed)
Physical Therapy Treatment Patient Details Name: DINA MOBLEY MRN: [id number] DOB: [birth year] Today's Date: 08/05/2016    History of Present Illness Pt is 76 y/o male s/p elective L TKA, direct anterior approach. Pt with HR  in 20-30, however, when cardiology consulted, reports that reading was an error and cleared for participation. PMH includes MI s/p stent placement and L heart cath with graft, a fib s/p cardioversion, HTN, s/p L shoulder replacement, CHF, CKD, and CAD.     PT Comments    Pt making great progress with mobility.  Increased gt distance and completed step training today.  Patient safe to D/C from a mobility standpoint based on progression towards goals set on PT eval.     Follow Up Recommendations  DC plan and follow up therapy as arranged by surgeon;Supervision for mobility/OOB     Equipment Recommendations  None recommended by PT    Recommendations for Other Services       Precautions / Restrictions Precautions Precautions: None Restrictions LLE Weight Bearing: Weight bearing as tolerated    Mobility  Bed Mobility                  Transfers Overall transfer level: Needs assistance Equipment used: Rolling walker (2 wheeled) Transfers: Sit to/from Stand Sit to Stand: Supervision         General transfer comment: supervision for safety.  completed with safe hand placement  Ambulation/Gait Ambulation/Gait assistance: Supervision Ambulation Distance (Feet): 150 Feet Assistive device: Rolling walker (2 wheeled) Gait Pattern/deviations: Step-through pattern;Decreased step length - right;Decreased step length - left;Decreased weight shift to left     General Gait Details: slow, choppy steps initially, but able to improve with increased step/stride length with cues.     Stairs Stairs: Yes   Stair Management: No rails;Forwards;With walker Number of Stairs: 1 (x2) General stair comments: cues for RW placement & sequencing  Wheelchair  Mobility    Modified Rankin (Stroke Patients Only)       Balance                                            Cognition Arousal/Alertness: Awake/alert Behavior During Therapy: WFL for tasks assessed/performed Overall Cognitive Status: Within Functional Limits for tasks assessed                                        Exercises      General Comments        Pertinent Vitals/Pain Pain Assessment: 0-10 Pain Score: 2  Pain Location: L hip  Pain Descriptors / Indicators: Aching;Discomfort Pain Intervention(s): Limited activity within patient's tolerance;Monitored during session;Repositioned    Home Living                      Prior Function            PT Goals (current goals can now be found in the care plan section) Acute Rehab PT Goals Patient Stated Goal: to go home PT Goal Formulation: With patient Time For Goal Achievement: 08/11/16 Potential to Achieve Goals: Good    Frequency    7X/week      PT Plan Current plan remains appropriate    Co-evaluation  AM-PAC PT "6 Clicks" Daily Activity  Outcome Measure                   End of Session Equipment Utilized During Treatment: Gait belt Activity Tolerance: Patient tolerated treatment well Patient left: in chair;with call bell/phone within reach Nurse Communication: Mobility status Pain - Right/Left: Left Pain - part of body: Hip     Time: 6644-0347 PT Time Calculation (min) (ACUTE ONLY): 13 min  Charges:  $Gait Training: 8-22 mins                    G CodesVerdell Face, [place] [phone] 08/05/2016    Lara Mulch 08/05/2016, 9:26 AM

## 2016-08-05 NOTE — Progress Notes (Signed)
ANTICOAGULATION CONSULT NOTE  Pharmacy Consult for warfarin Indication: atrial fibrillation and VTE prophylaxis  Allergies  Allergen Reactions  . Ivp Dye [Iodinated Diagnostic Agents] Other (See Comments)    Increased blood pressure, heart rate    Patient Measurements: Height: 5\' 11"  (180.3 cm) Weight: 151 lb (68.5 kg) IBW/kg (Calculated) : 75.3  Vital Signs: Temp: 98.3 F (36.8 C) (06/23 0504) Temp Source: Oral (06/23 0504) BP: 135/55 (06/23 0800) Pulse Rate: 66 (06/23 0800)  Labs:  Recent Labs  08/04/16 0645 08/04/16 1420 08/05/16 0444  HGB  --  10.2* 10.8*  HCT  --  31.8* 32.7*  PLT  --  222 228  LABPROT 15.3*  --  15.6*  INR 1.20  --  1.24  CREATININE  --  1.29* 1.24    Estimated Creatinine Clearance: 49.1 mL/min (by C-G formula based on SCr of 1.24 mg/dL).   Assessment: 76 y/o male s/p left THA on warfarin PTA for Afib. Pharmacy consulted to manage inpatient.  INR 1.24 and subtherapeutic today. No bleeding noted, Hgb 10.8, platelets are normal.  PTA: 5 mg daily exc 2.5 mg T/Th  Goal of Therapy:  INR 2-3 Monitor platelets by anticoagulation protocol: Yes   Plan:  Warfarin 7.5 mg PO tonight  Daily INR D/c enoxaparin when INR >= 1.8 Monitor for s/sx of bleeding If discharged today, would resume home dose tomorrow   73, PharmD, BCPS Clinical Pharmacist Phone for today [phone] Main pharmacy - [phone] 08/05/2016 10:05 AM

## 2016-08-05 NOTE — Care Management Note (Signed)
Case Management Note  Patient Details  Name: [name] MRN: [id number] Date of Birth: 13-Feb-1941  Subjective/Objective:                 Spoke with nurse and patient. Patient states that Dr Linna Caprice told him he probably would not need PT after DC. HH PT not ordered. Patient states he has all DME at home already, no other CM needs identified.    Action/Plan:  DC to home.  Expected Discharge Date:  08/05/16               Expected Discharge Plan:  Home/Self Care  In-House Referral:     Discharge planning Services  CM Consult  Post Acute Care Choice:    Choice offered to:     DME Arranged:    DME Agency:     HH Arranged:    HH Agency:     Status of Service:  Completed, signed off  If discussed at [employer] of Stay Meetings, dates discussed:    Additional Comments:  Lawerance Sabal, RN 08/05/2016, 11:46 AM

## 2016-08-07 ENCOUNTER — Encounter (HOSPITAL_COMMUNITY): Payer: Self-pay | Admitting: Orthopedic Surgery

## 2016-08-07 DIAGNOSIS — Z7901 Long term (current) use of anticoagulants: Secondary | ICD-10-CM | POA: Diagnosis not present

## 2016-08-14 NOTE — Discharge Summary (Signed)
Patient ID: [name] MRN: [id number] DOB/AGE: 04/21/1940 76 y.o.  Admit date: 08/04/2016 Discharge date: 08/05/16  Admission Diagnoses:  Principal Problem:   Osteoarthritis of left hip Active Problems:   Osteoarthritis of right hip   Discharge Diagnoses:  Principal Problem:   Osteoarthritis of left hip Active Problems:   Osteoarthritis of right hip  status post Procedure(s): LEFT TOTAL HIP ARTHROPLASTY ANTERIOR APPROACH  Past Medical History:  Diagnosis Date  . Arthritis   . Atrial fibrillation (HCC)    successful cardioversion 12/2013  . Barrett's esophagus   . Chronic kidney disease    CKD  . Coronary artery disease    a. h/o CABG 1997. b. stenting to graft-RCA, CB to LAD after LIMA in 2006. c. stenting to VG-RCA beyond initial stent and in-stent restenosis in 2011. c. Complex PCI 07/2013 to SVG-RCA and distal native RCA involving 4 sites (see cath report).  . Diabetes (HCC)    Type II  . Dyspnea    with exertion  . GERD (gastroesophageal reflux disease)   . Heart murmur   . History of kidney stones   . Hyperlipidemia   . Hypertension   . Myocardial infarction (HCC)   . Pre-syncope    a. 09/2012: in the setting of mild dehydration with significant straining possibly inducing a vagal event while he was dragging heavy logs.   . Sleep apnea    hx of does not use cpap for yr has lost wt    Surgeries: Procedure(s): LEFT TOTAL HIP ARTHROPLASTY ANTERIOR APPROACH on 08/04/2016   Consultants:   Discharged Condition: Improved  Hospital Course: [name] is an 76 y.o. male who was admitted 08/04/2016 for operative treatment of Osteoarthritis of left hip. Patient failed conservative treatments (please see the history and physical for the specifics) and had severe unremitting pain that affects sleep, daily activities and work/hobbies. After pre-op clearance, the patient was taken to the operating room on 08/04/2016 and underwent  Procedure(s): LEFT TOTAL HIP  ARTHROPLASTY ANTERIOR APPROACH.  Post op day one pt reports mild pain controlled on oral meds.  Pt is voiding w/o difficulty.  Pt cleared by PT for DC.    Patient was given perioperative antibiotics:  Anti-infectives    Start     Dose/Rate Route Frequency Ordered Stop   08/04/16 1430  ceFAZolin (ANCEF) IVPB 2g/100 mL premix     2 g 200 mL/hr over 30 Minutes Intravenous Every 6 hours 08/04/16 1339 08/04/16 2044   08/04/16 0715  ceFAZolin (ANCEF) IVPB 2g/100 mL premix     2 g 200 mL/hr over 30 Minutes Intravenous On call to O.R. 08/03/16 1021 08/04/16 0802       Patient was given sequential compression devices and early ambulation to prevent DVT.   Patient benefited maximally from hospital stay and there were no complications. At the time of discharge, the patient was urinating/moving their bowels without difficulty, tolerating a regular diet, pain is controlled with oral pain medications and they have been cleared by PT/OT.   Recent vital signs: No data found.    Recent laboratory studies: No results for input(s): WBC, HGB, HCT, PLT, NA, K, CL, CO2, BUN, CREATININE, GLUCOSE, INR, CALCIUM in the last 72 hours.  Invalid input(s): PT, 2   Discharge Medications:   Allergies as of 08/05/2016      Reactions   Ivp Dye [iodinated Diagnostic Agents] Other (See Comments)   Increased blood pressure, heart rate      Medication  List    STOP taking these medications   acetaminophen 500 MG tablet Commonly known as:  TYLENOL   traMADol 50 MG tablet Commonly known as:  ULTRAM     TAKE these medications   atorvastatin 10 MG tablet Commonly known as:  LIPITOR Take 10 mg by mouth at bedtime.   carvedilol 25 MG tablet Commonly known as:  COREG Take 25 mg by mouth 2 (two) times daily.   digoxin 0.125 MG tablet Commonly known as:  LANOXIN Take 1 tablet (0.125 mg total) by mouth daily.   diltiazem 120 MG 24 hr capsule Commonly known as:  CARDIZEM CD Take 1 capsule (120 mg total) by  mouth 2 (two) times daily before a meal.   docusate sodium 100 MG capsule Commonly known as:  COLACE Take 1 capsule (100 mg total) by mouth 2 (two) times daily.   enoxaparin 40 MG/0.4ML injection Commonly known as:  LOVENOX Inject 0.4 mLs (40 mg total) into the skin daily.   glimepiride 2 MG tablet Commonly known as:  AMARYL Take 2 mg by mouth 2 (two) times daily before a meal.   hydrALAZINE 50 MG tablet Commonly known as:  APRESOLINE Take 50 mg by mouth 3 (three) times daily.   HYDROcodone-acetaminophen 5-325 MG tablet Commonly known as:  NORCO Take 1-2 tablets by mouth every 4 (four) hours as needed for moderate pain.   metFORMIN 500 MG 24 hr tablet Commonly known as:  GLUCOPHAGE-XR Take 500 mg by mouth 2 (two) times daily with a meal.   nitroGLYCERIN 0.4 MG SL tablet Commonly known as:  NITROSTAT Place 0.4 mg under the tongue every 5 (five) minutes as needed for chest pain.   ondansetron 4 MG tablet Commonly known as:  ZOFRAN Take 1 tablet (4 mg total) by mouth every 8 (eight) hours as needed for nausea or vomiting.   pantoprazole 40 MG tablet Commonly known as:  PROTONIX Take 40 mg by mouth 2 (two) times daily before a meal.   senna 8.6 MG Tabs tablet Commonly known as:  SENOKOT Take 2 tablets (17.2 mg total) by mouth at bedtime.   valsartan 160 MG tablet Commonly known as:  DIOVAN Take 80 mg by mouth at bedtime.   warfarin 5 MG tablet Commonly known as:  COUMADIN Take 1 tablet (5 mg total) by mouth daily. What changed:  how much to take  when to take this  additional instructions       Diagnostic Studies: Dg Pelvis Portable  Result Date: 08/04/2016 CLINICAL DATA:  Left hip arthroplasty. EXAM: PORTABLE PELVIS 1-2 VIEWS COMPARISON:  08/04/2016 . FINDINGS: Total left hip replacement. Anatomic alignment. Hardware intact. No acute bony abnormality identified. IMPRESSION: Total left hip replacement.  Anatomic alignment.  Hardware intact . Electronically  Signed   By: Maisie Fus  Register   On: 08/04/2016 11:08   Dg C-arm 61-120 Min  Result Date: 08/04/2016 CLINICAL DATA:  75 year old male with a history of total hip arthroplasty EXAM: DG C-ARM 61-120 MIN; OPERATIVE LEFT HIP WITH PELVIS COMPARISON:  None. FINDINGS: Intraoperative fluoroscopic spot images of left hip arthroplasty. Gas within the surgical bed with alignment of the acetabular and femoral components. IMPRESSION: Limited intraoperative images of left hip arthroplasty with no complicating features identified. Please refer to the dictated operative report for full details of intraoperative findings and procedure. Electronically Signed   By: Gilmer Mor D.O.   On: 08/04/2016 10:00   Dg Hip Operative Unilat W Or W/o Pelvis Left  Result Date:  08/04/2016 CLINICAL DATA:  76 year old male with a history of total hip arthroplasty EXAM: DG C-ARM 61-120 MIN; OPERATIVE LEFT HIP WITH PELVIS COMPARISON:  None. FINDINGS: Intraoperative fluoroscopic spot images of left hip arthroplasty. Gas within the surgical bed with alignment of the acetabular and femoral components. IMPRESSION: Limited intraoperative images of left hip arthroplasty with no complicating features identified. Please refer to the dictated operative report for full details of intraoperative findings and procedure. Electronically Signed   By: Gilmer Mor D.O.   On: 08/04/2016 10:00      Follow-up Information    Swinteck, Arlys John, MD. Schedule an appointment as soon as possible for a visit in 2 weeks.   Specialty:  Orthopedic Surgery Why:  For wound re-check Contact information: [address] [place] [address] [phone]           Discharge Plan:  discharge to home Post op meds provided Pt will keep PT appts per surgeon Pt will present to clinic in 2 weeks Disposition:     Signed: Bailey Faiella, Baxter Kail for Dr. Venita Lick [facility] [phone] 08/14/2016, 10:40 AM

## 2016-08-21 DIAGNOSIS — Z7901 Long term (current) use of anticoagulants: Secondary | ICD-10-CM | POA: Diagnosis not present

## 2016-08-22 DIAGNOSIS — Z471 Aftercare following joint replacement surgery: Secondary | ICD-10-CM | POA: Diagnosis not present

## 2016-08-22 DIAGNOSIS — Z96642 Presence of left artificial hip joint: Secondary | ICD-10-CM | POA: Diagnosis not present

## 2016-09-04 DIAGNOSIS — Z7901 Long term (current) use of anticoagulants: Secondary | ICD-10-CM | POA: Diagnosis not present

## 2016-09-08 DIAGNOSIS — E7800 Pure hypercholesterolemia, unspecified: Secondary | ICD-10-CM | POA: Diagnosis not present

## 2016-09-08 DIAGNOSIS — N182 Chronic kidney disease, stage 2 (mild): Secondary | ICD-10-CM | POA: Diagnosis not present

## 2016-09-08 DIAGNOSIS — Z7984 Long term (current) use of oral hypoglycemic drugs: Secondary | ICD-10-CM | POA: Diagnosis not present

## 2016-09-08 DIAGNOSIS — G4733 Obstructive sleep apnea (adult) (pediatric): Secondary | ICD-10-CM | POA: Diagnosis not present

## 2016-09-08 DIAGNOSIS — M069 Rheumatoid arthritis, unspecified: Secondary | ICD-10-CM | POA: Diagnosis not present

## 2016-09-08 DIAGNOSIS — E1122 Type 2 diabetes mellitus with diabetic chronic kidney disease: Secondary | ICD-10-CM | POA: Diagnosis not present

## 2016-09-08 DIAGNOSIS — I10 Essential (primary) hypertension: Secondary | ICD-10-CM | POA: Diagnosis not present

## 2016-09-08 DIAGNOSIS — K2270 Barrett's esophagus without dysplasia: Secondary | ICD-10-CM | POA: Diagnosis not present

## 2016-09-08 DIAGNOSIS — I480 Paroxysmal atrial fibrillation: Secondary | ICD-10-CM | POA: Diagnosis not present

## 2016-09-19 DIAGNOSIS — Z471 Aftercare following joint replacement surgery: Secondary | ICD-10-CM | POA: Diagnosis not present

## 2016-09-19 DIAGNOSIS — Z96642 Presence of left artificial hip joint: Secondary | ICD-10-CM | POA: Diagnosis not present

## 2016-09-26 DIAGNOSIS — Z7901 Long term (current) use of anticoagulants: Secondary | ICD-10-CM | POA: Diagnosis not present

## 2016-09-28 DIAGNOSIS — Z7901 Long term (current) use of anticoagulants: Secondary | ICD-10-CM | POA: Diagnosis not present

## 2016-09-28 DIAGNOSIS — Z951 Presence of aortocoronary bypass graft: Secondary | ICD-10-CM | POA: Diagnosis not present

## 2016-09-28 DIAGNOSIS — I2510 Atherosclerotic heart disease of native coronary artery without angina pectoris: Secondary | ICD-10-CM | POA: Diagnosis not present

## 2016-09-28 DIAGNOSIS — I480 Paroxysmal atrial fibrillation: Secondary | ICD-10-CM | POA: Diagnosis not present

## 2016-10-03 DIAGNOSIS — M169 Osteoarthritis of hip, unspecified: Secondary | ICD-10-CM | POA: Diagnosis not present

## 2016-10-03 DIAGNOSIS — E1165 Type 2 diabetes mellitus with hyperglycemia: Secondary | ICD-10-CM | POA: Diagnosis not present

## 2016-10-03 DIAGNOSIS — I480 Paroxysmal atrial fibrillation: Secondary | ICD-10-CM | POA: Diagnosis not present

## 2016-10-03 DIAGNOSIS — N182 Chronic kidney disease, stage 2 (mild): Secondary | ICD-10-CM | POA: Diagnosis not present

## 2016-10-03 DIAGNOSIS — M069 Rheumatoid arthritis, unspecified: Secondary | ICD-10-CM | POA: Diagnosis not present

## 2016-10-03 DIAGNOSIS — M1990 Unspecified osteoarthritis, unspecified site: Secondary | ICD-10-CM | POA: Diagnosis not present

## 2016-10-03 DIAGNOSIS — E119 Type 2 diabetes mellitus without complications: Secondary | ICD-10-CM | POA: Diagnosis not present

## 2016-10-03 DIAGNOSIS — N400 Enlarged prostate without lower urinary tract symptoms: Secondary | ICD-10-CM | POA: Diagnosis not present

## 2016-10-03 DIAGNOSIS — I10 Essential (primary) hypertension: Secondary | ICD-10-CM | POA: Diagnosis not present

## 2016-10-03 DIAGNOSIS — E1122 Type 2 diabetes mellitus with diabetic chronic kidney disease: Secondary | ICD-10-CM | POA: Diagnosis not present

## 2016-10-20 DIAGNOSIS — R972 Elevated prostate specific antigen [PSA]: Secondary | ICD-10-CM | POA: Diagnosis not present

## 2016-10-24 DIAGNOSIS — Z7901 Long term (current) use of anticoagulants: Secondary | ICD-10-CM | POA: Diagnosis not present

## 2016-11-03 DIAGNOSIS — R972 Elevated prostate specific antigen [PSA]: Secondary | ICD-10-CM | POA: Diagnosis not present

## 2016-11-07 DIAGNOSIS — Z7901 Long term (current) use of anticoagulants: Secondary | ICD-10-CM | POA: Diagnosis not present

## 2016-11-21 DIAGNOSIS — Z7901 Long term (current) use of anticoagulants: Secondary | ICD-10-CM | POA: Diagnosis not present

## 2016-12-05 DIAGNOSIS — Z7901 Long term (current) use of anticoagulants: Secondary | ICD-10-CM | POA: Diagnosis not present

## 2016-12-12 DIAGNOSIS — Z7901 Long term (current) use of anticoagulants: Secondary | ICD-10-CM | POA: Diagnosis not present

## 2016-12-26 DIAGNOSIS — Z7901 Long term (current) use of anticoagulants: Secondary | ICD-10-CM | POA: Diagnosis not present

## 2017-01-23 DIAGNOSIS — I209 Angina pectoris, unspecified: Secondary | ICD-10-CM | POA: Diagnosis not present

## 2017-01-23 DIAGNOSIS — Z951 Presence of aortocoronary bypass graft: Secondary | ICD-10-CM | POA: Diagnosis not present

## 2017-01-23 DIAGNOSIS — Z7901 Long term (current) use of anticoagulants: Secondary | ICD-10-CM | POA: Diagnosis not present

## 2017-01-23 DIAGNOSIS — I480 Paroxysmal atrial fibrillation: Secondary | ICD-10-CM | POA: Diagnosis not present

## 2017-01-25 DIAGNOSIS — Z7901 Long term (current) use of anticoagulants: Secondary | ICD-10-CM | POA: Diagnosis not present

## 2017-01-26 DIAGNOSIS — Z951 Presence of aortocoronary bypass graft: Secondary | ICD-10-CM | POA: Diagnosis not present

## 2017-01-26 DIAGNOSIS — I2510 Atherosclerotic heart disease of native coronary artery without angina pectoris: Secondary | ICD-10-CM | POA: Diagnosis not present

## 2017-01-26 DIAGNOSIS — R0789 Other chest pain: Secondary | ICD-10-CM | POA: Diagnosis not present

## 2017-02-02 DIAGNOSIS — I480 Paroxysmal atrial fibrillation: Secondary | ICD-10-CM | POA: Diagnosis not present

## 2017-02-02 DIAGNOSIS — R0989 Other specified symptoms and signs involving the circulatory and respiratory systems: Secondary | ICD-10-CM | POA: Diagnosis not present

## 2017-02-02 DIAGNOSIS — I209 Angina pectoris, unspecified: Secondary | ICD-10-CM | POA: Diagnosis not present

## 2017-02-02 DIAGNOSIS — Z951 Presence of aortocoronary bypass graft: Secondary | ICD-10-CM | POA: Diagnosis not present

## 2017-02-04 DIAGNOSIS — R9439 Abnormal result of other cardiovascular function study: Secondary | ICD-10-CM

## 2017-02-04 NOTE — H&P (Signed)
[name] 01/23/2017 3:00 PM Location: [facility] Patient #: [id number] DOB: 05/02/1940 Married / Language: English / Race: White Male   History of Present Illness Andria Frames K. Vyas MD; 01/23/2017 4:24 PM) Patient words: Last OV 09/28/2016; Acute visit for cp.  The patient is a 76 year old male who presents for a follow-up for Coronary artery disease. He has a history of CAD, h/o CABG in 1997, h/o PCI and DES stents to SVG to RCA in June of 2015, paroxysmal symptomatic (fatigue) atrial fibrillation S/P successful cardioversion in November 2015.   He presents today for complaint of chest pain off and on for past 4 weeks. The pain is felt in the substernal region, occurs on walking or when he is working around the house. There is no radiation to the arm or neck. No associated dyspnea, diaphoresis or nausea. Chest pain subsides promptly with nitroglycerin tablet. He denies any shortness of breath, orthopnea or PND.  He has back pain. Was diagnosed with sleep apnea in past, lost 30 Lbs in 2017 and does not use CPAP. Patient also has history of Barrett's esophagus and follows Dr. Deatra Ina. He underwent total hip replacement left 08/04/16 and had no peri-procedural complications.  Patient denies any recent symptoms of palpitation, sudden heart racing or irregular heart beat. No history of dizziness, near-syncope or syncope. He remains on warfarin and hts INR is managed by PCP. Patient is tolerating Coumadin well and has not had any bleeding from any site.   Problem List/Past Medical Anderson Malta Sergeant; 01/23/2017 2:56 PM) H/O myocardial infarction, greater than 8 weeks (I25.2)  GERD (gastroesophageal reflux disease) (K21.9)  Arthritis (M19.90)  Sleep apnea (G47.30)  Barrett's esophagus (K22.70)  Atherosclerosis of native coronary artery of native heart without angina pectoris (I25.10)  Lexiscan Myoview stress test 04/04/2012: Normal perfusion without evidence of  ischemia. Normal LV systolic function. Low risk study. Echocardiogram 10/29/2013: Normal LV size, normal LV systolic function, EF 51%, basal inferior severe hypokinesis. Moderate left atrial enlargement. No other significant valvular abnormalities. Coronary angio 07/30/2013: LAD occluded in midsegment, LIMA to LAD patent. Left circumflex proximal 90% diffuse stenosis. SVG to OM1 occluded, old. RCA occluded in the proximal segment, SVG to RCA patent with severe stenosis S/P stenting with Xience Alpine 3.5x28 mm DES stent in the prox segment, 3.0x18 mm Xience Alpine stent in distal segment. LVEF 50-55%. Mixed hyperlipidemia (E78.2)  Anticoagulant long-term use (Z79.01)  Controlled type 2 diabetes mellitus (E11.9)  Obstructive sleep apnea on CPAP (G47.33)  Was diagnosed with sleep apnea, lost 30 Lbs in 2017 and does not use CPAP. Labwork  Labs 08/19/2015: A1c 6.7%. HB 11.5/HCT 34.8, platelets 255. BUN 24, serum creatinine 1.01, eGFR 72 mL. Potassium 4.6, CMP otherwise normal. Total cholesterol 135, triglycerides 145, HDL 35, LDL 71. Non-HDL 100. Labs 05/07/2014: Total cholesterol 137, triglycerides 83, HDL 51, LDL 70. CMP was normal, potassium 4.4, BUN 25, serum creatinine 1.05, eGFR 69 mL. HbA1c 6.2%. Labs 12/09/2013: Total cholesterol 225, triglycerides 158, HDL 52, LDL 141, TSH normal. 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 Left circumflex proximal 90% diffuse stenosis. SVG to OM1 occluded, old. RCA occluded in the proximal segment, SVG to RCA patent. S/P PCI to SVG to RCA. Paroxysmal atrial fibrillation (I48.0) [10/28/2013]: CHA2DS2-VASc Score is 5 with yearly risk of stroke of 6.7%. HAS-Bled score is 2 and estimated  major bleeding in one year is 1.88-3.2% withour plavix and 4 and risk of bleed 4.9 to 19% H/O Cardioversion  12/29/2013 A. FIb to NSR  Allergies Anderson Malta Sergeant; Feb 04, 2017 2:56 PM) Iodinated Contrast   Family History Anderson Malta Sergeant; Feb 04, 2017 2:56 PM) Mother  Deceased. at age 64, from MI hx of dm, heart disease,htn Father  Deceased. at age 66, from Cancer Sister 1  In stable health. dm, htn Brother 2  Brother 1- deceased at age 81 from [place] Disease Brother 2- deceased at age 3 from [place] Lolita Lenz; 2017/02/04 2:56 PM) Current tobacco use  Former smoker. Quit in 2000 Alcohol Use  Occasional alcohol use. 2-3 beers per month Marital status  Married. Number of Children  1. Living Situation  Lives with spouse.  Past Surgical History Anderson Malta Sergeant; 2017/02/04 2:56 PM) HERNIA REPAIR [1974]: KIDNEY STONE SURGERY [1993]: Had several surgeries for this in the 1990's [place] GRAFT [1997]: SHOULDER SURGERY [11/2015]: Bilateral. Left shoulder replaced. Pt states they replaced bone in both shoulders in 2001 CORONARY ANGIOPLASTY WITH STENT PLACEMENT [07/30/2013]: DES to RCA DR [facility] Total Hip Replacement - Left [08/04/2016]:  Medication History Anderson Malta Sergeant; February 04, 2017 3:09 PM) Warfarin Sodium ('5MG'$  Tablet, 1 Oral daily; 2.'5mg'$  T,Th, Taken starting 02/27/2014) Active. Carvedilol ('25MG'$  Tablet, 1 Oral two times daily) Active. MetFORMIN HCl ('500MG'$  Tablet, 1 Oral two times daily) Active. Diltiazem HCl ER Coated Beads ('120MG'$  Capsule ER 24HR, 1 Oral two times daily) Active. Glimepiride ('1MG'$  Tablet, 1 Oral daily) Active. Valsartan ('80MG'$  Tablet, 1 Oral daily) Active. Atorvastatin Calcium ('10MG'$  Tablet, 1 Oral daily) Active. Glimepiride ('2MG'$  Tablet, 1 Oral two times daily) Active. HydrALAZINE HCl ('50MG'$  Tablet, 1 Oral three times daily) Active. Pantoprazole Sodium ('40MG'$  Tablet DR, 1 Oral daily) Active. TraMADol HCl ('50MG'$  Tablet, 1 Oral as needed) Active. Medications Reconciled (List present)  Diagnostic Studies History  Anderson Malta Sergeant; 02-04-17 2:56 PM) Colonoscopy [12/14/2009]: 2 polyps removed, diverticulosis in sigmoid colon, bleeding internal hemorrhoids. Endoscopy [2011]: GERD; Barretts Esophogus Stress cardiolite stress test [04/04/2012]: Lexiscan Myoview stress test 04/04/2012: Normal perfusion without evidence of ischemia. Normal LV systolic function. Low risk study. Sleep Study [2015]: Positive for Sleep Apnea, pt uses CPAP Coronary Angiogram [07/30/2013]: LAD occluded in midsegment, hematoma to LAD patent. Left circumflex proximal 90% diffuse stenosis. SVG to OM1 occluded, old. RCA occluded in the proximal segment, SVG to RCA patent with severe stenosis S/P stenting with Xience Alpine 3.5x28 mm DES stent in the prox segment, 3.0x18 mm Xience Alpine stent in distal segment. LVEF 50-55%. Echocardiogram [10/29/2013]: Normal LV size, normal LV systolic function, EF 78%, basal inferior severe hypokinesis. Moderate left atrial enlargement. No other significant valvular abnormalities. Cardioversion [12/29/2013]: 12/29/2013: A. Fib to NSR    Review of Systems [employer] K. Vyas MD; 02/04/17 4:39 PM) General Present- Feeling well. Not Present- Fatigue, Fever and Night Sweats. Skin Not Present- Itching and Rash. HEENT Not Present- Headache. Respiratory Present- Snoring (Was diagnosed with sleep apnea, lost 30 Lbs in 2017 and does not use CPAP.). Not Present- Difficulty Breathing. Cardiovascular Present- Chest Pain. Not Present- Claudications, Fainting, Orthopnea and Swelling of Extremities. Gastrointestinal Present- Indigestion. Not Present- Abdominal Pain, Black, Tarry Stool, Constipation, Diarrhea, Nausea and Vomiting. Musculoskeletal Present- Joint Pain and Joint Stiffness (diffuse). Not Present- Joint Swelling. Neurological Not Present- Headaches. Hematology Not Present- Blood Clots, Easy Bruising and Nose Bleed. All other systems negative  Vitals Anderson Malta Sergeant; 02/04/2017 3:05  PM) 2017/02/04 3:00 PM Weight: 196.13 lb Height: 71in Body Surface Area: 2.09 m  Body Mass Index: 27.35 kg/m  Pulse: 66 (Regular)  P.OX: 98% (Room air) BP: 160/70 (Sitting, Left Arm, Standard)       Physical Exam Despina Hick, MD; 01/23/2017 4:41 PM) General Mental Status-Alert. General Appearance-Cooperative, Appears stated age, Not in acute distress. Orientation-Oriented X3. Build & Nutrition-Well built and Well nourished.  Head and Neck Thyroid Gland Characteristics - no palpable nodules, no palpable enlargement.  Chest and Lung Exam Chest and lung exam reveals -on auscultation, normal breath sounds, no adventitious sounds.  Cardiovascular Cardiovascular examination reveals -carotid auscultation reveals no bruits, femoral artery auscultation bilaterally reveals normal pulses, no bruits, no thrills, normal pedal pulses bilaterally and no digital clubbing, cyanosis, edema, increased warmth or tenderness. Inspection Jugular vein - Right - No Distention. Auscultation Heart Sounds - S1 WNL, S2 WNL, No gallop present and Ejection systolic click present. Murmurs & Other Heart Sounds: Murmur - Location - Aortic Area and Sternal Border - Left. Timing - Early systolic. Grade - II/VI.  Abdomen Palpation/Percussion Normal exam - Non Tender and No hepatosplenomegaly. Auscultation Normal exam - Bowel sounds normal.  Neurologic Motor-Grossly intact without any focal deficits.  Musculoskeletal Global Assessment Left Lower Extremity - normal range of motion without pain. Right Lower Extremity - normal range of motion without pain.    Assessment & Plan Andria Frames K. Vyas MD; 01/23/2017 7:23 PM) Angina pectoris (I20.9) Current Plans Started Isosorbide Mononitrate ER 60MG, 1 (one) Tablet daily, #30, 30 days starting 01/23/2017, Ref. x3. Complete electrocardiogram (93000) Started Aspirin EC Low Dose 81MG, 1 (one) Tablet daily, #30, 01/23/2017, No  Refill. 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 Left circumflex proximal 90% diffuse stenosis. SVG to OM1 occluded, old. RCA occluded in the proximal segment, SVG to RCA patent. S/P PCI to SVG to RCA. Paroxysmal atrial fibrillation (I48.0) Story: CHA2DS2-VASc Score is 5 with yearly risk of stroke of 6.7%. HAS-Bled score is 2 and estimated major bleeding in one year is 1.88-3.2% withour plavix and 4 and risk of bleed 4.9 to 19%  H/O Cardioversion 12/29/2013 A. FIb to NSR Impression: EKG 09/28/2016: Sinus bradycardia at the rate of 52 bpm, left atrial enlargement, IR BBB, LVH with repolarization abnormality, cannot exclude inferior and lateral ischemia. No significant change from EKG 08/27/2015. Anticoagulant long-term use (Z79.01) Labwork Story: Labs 08/19/2015: A1c 6.7%. HB 11.5/HCT 34.8, platelets 255. BUN 24, serum creatinine 1.01, eGFR 72 mL. Potassium 4.6, CMP otherwise normal. Total cholesterol 135, triglycerides 145, HDL 35, LDL 71. Non-HDL 100.  Labs 05/07/2014: Total cholesterol 137, triglycerides 83, HDL 51, LDL 70. CMP was normal, potassium 4.4, BUN 25, serum creatinine 1.05, eGFR 69 mL. HbA1c 6.2%.  Labs 12/09/2013: Total cholesterol 225, triglycerides 158, HDL 52, LDL 141, TSH normal. serum glucose 112, BUN 29, creatinine 1.46, potassium 4.6, CBC normal Essential hypertension (I10) Current Plans Discontinued Valsartan 80MG. Started Olmesartan Medoxomil 20MG, 1 (one) Tablet daily, #30, 30 days starting 01/23/2017, Ref. x4. Mixed hyperlipidemia (E78.2)  Note:EKG-sinus rhythm, left atrial abnormality, can't rule out old inferior wall MI, minor right-sided  IVCD, mild ST depression in the inferolateral leads, cannot rule out ischemia. Recommendations:  I have discussed my assessment with the patient and his wife. Patient with known coronary artery disease, comes with history of exertional angina. I have  advised him to continue using nitroglycerin as needed. Patient was also advised that if there is any spell of prolonged chest pain, not relieved with 3 tablets of nitroglycerin or if he has severe crushing chest pain associated with diaphoresis, dyspnea, nausea etc., he should call 911 and go to the emergency room.  I have added isosorbide mononitrate for angina. Patient has been scheduled for Lexiscan Myoview scans.  Patient has paroxysmal atrial fibrillation, maintains sinus rhythm, tolerating anticoagulation. Patient said that his pro time INR one month ago was 1.9 and prior to that it was 1.8. Since he has known CAD and has developed angina, I have added enteric-coated aspirin 81 mg daily. He was advised to have pro time checked at his PCP's office after 2 days. I have also advised him that if there is any bleeding, he should stop warfarin and call us.  Lipids are being managed by his PCP. Blood pressure is elevated today, patient did not take today's afternoon dose of hydralazine. I have advised him to continue present meds. except- stop valsartan. He has been started on on Olmesartan 20 mg daily and was advised to follow the blood pressure. Call us if it stays high.  Secondary prevention was discussed with him. He was advised to follow low-salt, low-cholesterol diet and also calorie restriction to lose weight.  He will return for follow-up with Dr. Einar Gip after the [facility] scans. It was a 30 minutes face to face visit with more than half the time spent explaining the condition and management to the patient and counseling.  CC: Wenda Low, MD  Signed electronically by Despina Hick, MD (01/23/2017 7:26 PM)   Leane Call stress test 01/26/2017:  1. Pharmacologic stress testing was performed with intravenous administration of .4 mg of Lexiscan over a 10-15 seconds infusion. Stress EKG is non diagnostic for ischemia as it is a pharmacologic stress.  2. Gated SPECT images reveal RCA territory hypokinesis.  SPECT images demonstrate medium sized, severe intensity, partially reversible perfusion defect in mid inferoseptal, mid inferior and apical inferior myocardial wall, s/o small infarct with moderate periinfarct ischemia in RCA territory. LVEF 59%. 3. THis is an intermediate risk study.

## 2017-02-07 DIAGNOSIS — R0989 Other specified symptoms and signs involving the circulatory and respiratory systems: Secondary | ICD-10-CM | POA: Diagnosis not present

## 2017-02-09 ENCOUNTER — Encounter (HOSPITAL_COMMUNITY)
Admission: RE | Disposition: A | Discharge status: Home / Self Care | Payer: Self-pay | Source: Ambulatory Visit | Attending: Cardiology

## 2017-02-09 ENCOUNTER — Ambulatory Visit (HOSPITAL_COMMUNITY)
Admission: RE | Admit: 2017-02-09 | Discharge: 2017-02-09 | Disposition: A | Discharge status: Home / Self Care | Payer: PPO | Source: Ambulatory Visit | Attending: Cardiology | Admitting: Cardiology

## 2017-02-09 DIAGNOSIS — Z7901 Long term (current) use of anticoagulants: Secondary | ICD-10-CM | POA: Diagnosis not present

## 2017-02-09 DIAGNOSIS — Z79899 Other long term (current) drug therapy: Secondary | ICD-10-CM | POA: Insufficient documentation

## 2017-02-09 DIAGNOSIS — I252 Old myocardial infarction: Secondary | ICD-10-CM | POA: Diagnosis not present

## 2017-02-09 DIAGNOSIS — Z8249 Family history of ischemic heart disease and other diseases of the circulatory system: Secondary | ICD-10-CM | POA: Diagnosis not present

## 2017-02-09 DIAGNOSIS — I10 Essential (primary) hypertension: Secondary | ICD-10-CM | POA: Diagnosis not present

## 2017-02-09 DIAGNOSIS — E119 Type 2 diabetes mellitus without complications: Secondary | ICD-10-CM | POA: Diagnosis not present

## 2017-02-09 DIAGNOSIS — G4733 Obstructive sleep apnea (adult) (pediatric): Secondary | ICD-10-CM | POA: Insufficient documentation

## 2017-02-09 DIAGNOSIS — I480 Paroxysmal atrial fibrillation: Secondary | ICD-10-CM | POA: Insufficient documentation

## 2017-02-09 DIAGNOSIS — Z7984 Long term (current) use of oral hypoglycemic drugs: Secondary | ICD-10-CM | POA: Insufficient documentation

## 2017-02-09 DIAGNOSIS — Q2549 Other congenital malformations of aorta: Secondary | ICD-10-CM | POA: Diagnosis not present

## 2017-02-09 DIAGNOSIS — I6523 Occlusion and stenosis of bilateral carotid arteries: Secondary | ICD-10-CM | POA: Diagnosis not present

## 2017-02-09 DIAGNOSIS — I25118 Atherosclerotic heart disease of native coronary artery with other forms of angina pectoris: Secondary | ICD-10-CM | POA: Insufficient documentation

## 2017-02-09 DIAGNOSIS — R9439 Abnormal result of other cardiovascular function study: Secondary | ICD-10-CM

## 2017-02-09 DIAGNOSIS — Z96642 Presence of left artificial hip joint: Secondary | ICD-10-CM | POA: Insufficient documentation

## 2017-02-09 DIAGNOSIS — I25110 Atherosclerotic heart disease of native coronary artery with unstable angina pectoris: Secondary | ICD-10-CM | POA: Diagnosis not present

## 2017-02-09 DIAGNOSIS — K219 Gastro-esophageal reflux disease without esophagitis: Secondary | ICD-10-CM | POA: Diagnosis not present

## 2017-02-09 DIAGNOSIS — Z955 Presence of coronary angioplasty implant and graft: Secondary | ICD-10-CM | POA: Diagnosis not present

## 2017-02-09 DIAGNOSIS — Z951 Presence of aortocoronary bypass graft: Secondary | ICD-10-CM | POA: Insufficient documentation

## 2017-02-09 DIAGNOSIS — E782 Mixed hyperlipidemia: Secondary | ICD-10-CM | POA: Diagnosis not present

## 2017-02-09 HISTORY — PX: CAROTID ANGIOGRAPHY: CATH118230

## 2017-02-09 HISTORY — PX: LEFT HEART CATH AND CORS/GRAFTS ANGIOGRAPHY: CATH118250

## 2017-02-09 LAB — GLUCOSE, CAPILLARY
GLUCOSE-CAPILLARY: 149 mg/dL — AB (ref 65–99)
Glucose-Capillary: 143 mg/dL — ABNORMAL HIGH (ref 65–99)

## 2017-02-09 LAB — POCT ACTIVATED CLOTTING TIME: Activated Clotting Time: 180 seconds

## 2017-02-09 LAB — PROTIME-INR
INR: 1.35
Prothrombin Time: 16.6 seconds — ABNORMAL HIGH (ref 11.4–15.2)

## 2017-02-09 SURGERY — LEFT HEART CATH AND CORS/GRAFTS ANGIOGRAPHY
Anesthesia: LOCAL

## 2017-02-09 MED ORDER — ASPIRIN 81 MG PO CHEW
81.0000 mg | CHEWABLE_TABLET | ORAL | Status: AC
  Administered 2017-02-09: 81 mg via ORAL

## 2017-02-09 MED ORDER — SODIUM CHLORIDE 0.9% FLUSH: 3.0000 mL | Freq: Two times a day (BID) | INTRAVENOUS | Status: DC

## 2017-02-09 MED ORDER — LIDOCAINE HCL (PF) 1 % IJ SOLN
INTRAMUSCULAR | Status: AC
  Filled 2017-02-09: qty 30

## 2017-02-09 MED ORDER — SODIUM CHLORIDE 0.9% FLUSH: 3.0000 mL | INTRAVENOUS | Status: DC | PRN

## 2017-02-09 MED ORDER — LABETALOL HCL 5 MG/ML IV SOLN
15.0000 mg | Freq: Once | INTRAVENOUS | Status: AC
  Administered 2017-02-09: 15 mg via INTRAVENOUS

## 2017-02-09 MED ORDER — ASPIRIN 81 MG PO CHEW
CHEWABLE_TABLET | ORAL | Status: AC
  Administered 2017-02-09: 81 mg via ORAL
  Filled 2017-02-09: qty 1

## 2017-02-09 MED ORDER — DIPHENHYDRAMINE HCL 50 MG/ML IJ SOLN
25.0000 mg | Freq: Once | INTRAMUSCULAR | Status: AC
  Administered 2017-02-09: 25 mg via INTRAVENOUS

## 2017-02-09 MED ORDER — LIDOCAINE HCL (PF) 1 % IJ SOLN
INTRAMUSCULAR | Status: DC | PRN
  Administered 2017-02-09: 2 mL via INTRADERMAL
  Administered 2017-02-09: 15 mL via INTRADERMAL

## 2017-02-09 MED ORDER — IOPAMIDOL (ISOVUE-370) INJECTION 76%
INTRAVENOUS | Status: AC
  Filled 2017-02-09: qty 50

## 2017-02-09 MED ORDER — FAMOTIDINE IN NACL 20-0.9 MG/50ML-% IV SOLN
INTRAVENOUS | Status: AC
  Administered 2017-02-09: 20 mg via INTRAVENOUS
  Filled 2017-02-09: qty 50

## 2017-02-09 MED ORDER — SODIUM CHLORIDE 0.9 % IV SOLN: 250.0000 mL | INTRAVENOUS | Status: DC | PRN

## 2017-02-09 MED ORDER — SODIUM CHLORIDE 0.9 % IV SOLN
INTRAVENOUS | Status: AC
  Administered 2017-02-09: 09:00:00 via INTRAVENOUS

## 2017-02-09 MED ORDER — FAMOTIDINE IN NACL 20-0.9 MG/50ML-% IV SOLN
20.0000 mg | Freq: Once | INTRAVENOUS | Status: AC
  Administered 2017-02-09: 20 mg via INTRAVENOUS

## 2017-02-09 MED ORDER — SODIUM CHLORIDE 0.9 % WEIGHT BASED INFUSION: 1.0000 mL/kg/h | INTRAVENOUS | Status: DC

## 2017-02-09 MED ORDER — DIPHENHYDRAMINE HCL 50 MG/ML IJ SOLN
INTRAMUSCULAR | Status: AC
  Administered 2017-02-09: 25 mg via INTRAVENOUS
  Filled 2017-02-09: qty 1

## 2017-02-09 MED ORDER — VERAPAMIL HCL 2.5 MG/ML IV SOLN
INTRAVENOUS | Status: AC
  Filled 2017-02-09: qty 2

## 2017-02-09 MED ORDER — IOPAMIDOL (ISOVUE-370) INJECTION 76%
INTRAVENOUS | Status: DC | PRN
Start: 2017-02-09 — End: 2017-02-09
  Administered 2017-02-09: 135 mL via INTRA_ARTERIAL

## 2017-02-09 MED ORDER — VERAPAMIL HCL 2.5 MG/ML IV SOLN
INTRA_ARTERIAL | Status: DC | PRN
  Administered 2017-02-09 (×2): 10 mL via INTRA_ARTERIAL

## 2017-02-09 MED ORDER — NITROGLYCERIN 1 MG/10 ML FOR IR/CATH LAB
INTRA_ARTERIAL | Status: AC
  Filled 2017-02-09: qty 10

## 2017-02-09 MED ORDER — HEPARIN (PORCINE) IN NACL 2-0.9 UNIT/ML-% IJ SOLN
INTRAMUSCULAR | Status: AC | PRN
  Administered 2017-02-09: 1000 mL via INTRA_ARTERIAL

## 2017-02-09 MED ORDER — METFORMIN HCL ER 500 MG PO TB24: 500.0000 mg | ORAL_TABLET | Freq: Two times a day (BID) | ORAL | Status: AC

## 2017-02-09 MED ORDER — HEPARIN (PORCINE) IN NACL 2-0.9 UNIT/ML-% IJ SOLN
INTRAMUSCULAR | Status: AC
  Filled 2017-02-09: qty 1500

## 2017-02-09 MED ORDER — HEPARIN SODIUM (PORCINE) 1000 UNIT/ML IJ SOLN
INTRAMUSCULAR | Status: DC | PRN
  Administered 2017-02-09: 5000 [IU] via INTRAVENOUS

## 2017-02-09 MED ORDER — LABETALOL HCL 5 MG/ML IV SOLN
INTRAVENOUS | Status: AC
  Filled 2017-02-09: qty 4

## 2017-02-09 MED ORDER — IOPAMIDOL (ISOVUE-370) INJECTION 76%
INTRAVENOUS | Status: AC
  Filled 2017-02-09: qty 125

## 2017-02-09 MED ORDER — HEPARIN SODIUM (PORCINE) 1000 UNIT/ML IJ SOLN
INTRAMUSCULAR | Status: AC
  Filled 2017-02-09: qty 1

## 2017-02-09 SURGICAL SUPPLY — 17 items
CATH ANGIO 5F BER2 100CM (CATHETERS) ×3 IMPLANT
CATH ANGIO 5F SIM1 100CM (CATHETERS) ×3 IMPLANT
CATH INFINITI 5 FR IM (CATHETERS) ×3 IMPLANT
CATH INFINITI 5FR ANG PIGTAIL (CATHETERS) ×3 IMPLANT
CATH OPTITORQUE TIG 4.0 5F (CATHETERS) ×3 IMPLANT
DEVICE RAD COMP TR BAND LRG (VASCULAR PRODUCTS) ×3 IMPLANT
GLIDESHEATH SLEND A-KIT 6F 20G (SHEATH) ×3 IMPLANT
GUIDEWIRE INQWIRE 1.5J.035X260 (WIRE) ×2 IMPLANT
INQWIRE 1.5J .035X260CM (WIRE) ×3
KIT HEART LEFT (KITS) ×3 IMPLANT
KIT MICROINTRODUCER STIFF 5F (SHEATH) ×3 IMPLANT
PACK CARDIAC CATHETERIZATION (CUSTOM PROCEDURE TRAY) ×3 IMPLANT
SHEATH PINNACLE 5F 10CM (SHEATH) ×3 IMPLANT
SYR MEDRAD MARK V 150ML (SYRINGE) ×3 IMPLANT
TRANSDUCER W/STOPCOCK (MISCELLANEOUS) ×3 IMPLANT
TUBING CIL FLEX 10 FLL-RA (TUBING) ×3 IMPLANT
WIRE EMERALD 3MM-J .035X150CM (WIRE) ×3 IMPLANT

## 2017-02-09 NOTE — Progress Notes (Signed)
Assumed care of pt from Debbie Hedge, RN.  Assessment documented. 

## 2017-02-09 NOTE — Interval H&P Note (Signed)
History and Physical Interval Note:  02/09/2017 9:25 AM  [name]  has presented today for surgery, with the diagnosis of angina  The various methods of treatment have been discussed with the patient and family. After consideration of risks, benefits and other options for treatment, the patient has consented to  Procedure(s): LEFT HEART CATH AND CORONARY ANGIOGRAPHY (N/A) and possible angioplasty as a surgical intervention .  The patient's history has been reviewed, patient examined, no change in status, stable for surgery.  I have reviewed the patient's chart and labs.  Questions were answered to the patient's satisfaction.   Will also add on extracranial carotid angiogram. 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 Staff witnessed discussion with the patient. Symptom Status: Ischemic Symptoms Non-invasive Testing: Indeterminate If no or indeterminate stress test, FFR/iFR results in all diseased vessels: Not done Diabetes Mellitus: Yes S/P CABG: Yes Antianginal therapy (number of long-acting drugs): >=2 Patient undergoing renal transplant: No Patient undergoing percutaneous valve procedure: No  LIMA-LAD patent and without significant stenoses Stenosis supplying 1 territory (bypass graft or native artery) other than anterior  PCI: Not rated  CABG: Not rated Stenoses supplying 2 territories (bypass graft or native artery, either 2 separate vessels or sequential graft supplying 2 territories) not including anterior territory  PCI: Not rated  CABG: Not rated  LIMA-LAD not patent Stenosis supplying 1 territory (bypass graft or native artery)-anterior (LAD) territory  PCI: Not rated  CABG: Not rated Stenoses supplying 2 territories (bypass graft or native artery, either 2 separate vessels or sequential graft supplying 2 territories)-LAD plus other territory  PCI: Not rated  CABG: Not rated Stenoses supplying 3  territories (bypass graft or native arteries, separate vessels, sequential grafts, or combination thereof)-LAD plus 2 other territories  PCI: Not rated  CABG: Not rated  Notes:  A indicates appropriate. M indicates may be appropriate. R indicates rarely appropriate. Number in parentheses is median score for that indication. Reclassify indicates number of functionally diseased vessels should be decreased given negative FFR/iFR. Re-evaluate the scenario interpreting any FFR/iFR negative vessel as being not significantly stenosed.  Journal of the [employer] of Cardiology Mar 2017, 23391; DOI: 10.1016/j.jacc.2017.02.001 [email or web address]-[email or web address] This App  2018 by the Society for Cardiovascular Angiography and Interventions   [name]

## 2017-02-09 NOTE — Progress Notes (Signed)
Site area: Right groin, 5 Fr arterial Site prior: Level 0 Manual Pressure applied for: 20 min Patient Status During pull: Calm, cooperative Post Pull site: Level 0 Post pull instructions given Post pull pulses present: Yes, right DP Dressing applied Bedrest begins at: 11:30 Comments: No complications, site soft, patient understands post pull instructions, vitals stable throughout hold.

## 2017-02-09 NOTE — Progress Notes (Signed)
Discharge teaching done with patient and spouse. Instructed to hold Metformin for 48 hours per protocol. Instructed to resume coumadin tonight per Dr. Consuello Closs.

## 2017-02-09 NOTE — Discharge Instructions (Signed)
Femoral Site Care °Refer to this sheet in the next few weeks. These instructions provide you with information about caring for yourself after your procedure. Your health care provider may also give you more specific instructions. Your treatment has been planned according to current medical practices, but problems sometimes occur. Call your health care provider if you have any problems or questions after your procedure. °What can I expect after the procedure? °After your procedure, it is typical to have the following: °· Bruising at the site that usually fades within 1-2 weeks. °· Blood collecting in the tissue (hematoma) that may be painful to the touch. It should usually decrease in size and tenderness within 1-2 weeks. ° °Follow these instructions at home: °· Take medicines only as directed by your health care provider. °· You may shower 24-48 hours after the procedure or as directed by your health care provider. Remove the bandage (dressing) and gently wash the site with plain soap and water. Pat the area dry with a clean towel. Do not rub the site, because this may cause bleeding. °· Do not take baths, swim, or use a hot tub until your health care provider approves. °· Check your insertion site every day for redness, swelling, or drainage. °· Do not apply powder or lotion to the site. °· Limit use of stairs to twice a day for the first 2-3 days or as directed by your health care provider. °· Do not squat for the first 2-3 days or as directed by your health care provider. °· Do not lift over 10 lb (4.5 kg) for 5 days after your procedure or as directed by your health care provider. °· Ask your health care provider when it is okay to: °? Return to work or school. °? Resume usual physical activities or sports. °? Resume sexual activity. °· Do not drive home if you are discharged the same day as the procedure. Have someone else drive you. °· You may drive 24 hours after the procedure unless otherwise instructed by  your health care provider. °· Do not operate machinery or power tools for 24 hours after the procedure or as directed by your health care provider. °· If your procedure was done as an outpatient procedure, which means that you went home the same day as your procedure, a responsible adult should be with you for the first 24 hours after you arrive home. °· Keep all follow-up visits as directed by your health care provider. This is important. °Contact a health care provider if: °· You have a fever. °· You have chills. °· You have increased bleeding from the site. Hold pressure on the site. °Get help right away if: °· You have unusual pain at the site. °· You have redness, warmth, or swelling at the site. °· You have drainage (other than a small amount of blood on the dressing) from the site. °· The site is bleeding, and the bleeding does not stop after 30 minutes of holding steady pressure on the site. °· Your leg or foot becomes pale, cool, tingly, or numb. °This information is not intended to replace advice given to you by your health care provider. Make sure you discuss any questions you have with your health care provider. °Document Released: 10/03/2013 Document Revised: 07/08/2015 Document Reviewed: 08/19/2013 °Elsevier Interactive Patient Education © 2018 [employer] ° °Radial Site Care °Refer to this sheet in the next few weeks. These instructions provide you with information about caring for yourself after your procedure. Your health   care provider may also give you more specific instructions. Your treatment has been planned according to current medical practices, but problems sometimes occur. Call your health care provider if you have any problems or questions after your procedure. °What can I expect after the procedure? °After your procedure, it is typical to have the following: °· Bruising at the radial site that usually fades within 1-2 weeks. °· Blood collecting in the tissue (hematoma) that may be  painful to the touch. It should usually decrease in size and tenderness within 1-2 weeks. ° °Follow these instructions at home: °· Take medicines only as directed by your health care provider. °· You may shower 24-48 hours after the procedure or as directed by your health care provider. Remove the bandage (dressing) and gently wash the site with plain soap and water. Pat the area dry with a clean towel. Do not rub the site, because this may cause bleeding. °· Do not take baths, swim, or use a hot tub until your health care provider approves. °· Check your insertion site every day for redness, swelling, or drainage. °· Do not apply powder or lotion to the site. °· Do not flex or bend the affected arm for 24 hours or as directed by your health care provider. °· Do not push or pull heavy objects with the affected arm for 24 hours or as directed by your health care provider. °· Do not lift over 10 lb (4.5 kg) for 5 days after your procedure or as directed by your health care provider. °· Ask your health care provider when it is okay to: °? Return to work or school. °? Resume usual physical activities or sports. °? Resume sexual activity. °· Do not drive home if you are discharged the same day as the procedure. Have someone else drive you. °· You may drive 24 hours after the procedure unless otherwise instructed by your health care provider. °· Do not operate machinery or power tools for 24 hours after the procedure. °· If your procedure was done as an outpatient procedure, which means that you went home the same day as your procedure, a responsible adult should be with you for the first 24 hours after you arrive home. °· Keep all follow-up visits as directed by your health care provider. This is important. °Contact a health care provider if: °· You have a fever. °· You have chills. °· You have increased bleeding from the radial site. Hold pressure on the site. °Get help right away if: °· You have unusual pain at the  radial site. °· You have redness, warmth, or swelling at the radial site. °· You have drainage (other than a small amount of blood on the dressing) from the radial site. °· The radial site is bleeding, and the bleeding does not stop after 30 minutes of holding steady pressure on the site. °· Your arm or hand becomes pale, cool, tingly, or numb. °This information is not intended to replace advice given to you by your health care provider. Make sure you discuss any questions you have with your health care provider. °Document Released: 03/04/2010 Document Revised: 07/08/2015 Document Reviewed: 08/18/2013 °Elsevier Interactive Patient Education © 2018 Elsevier Inc. ° ° °

## 2017-02-12 ENCOUNTER — Encounter (HOSPITAL_COMMUNITY): Payer: Self-pay | Admitting: Cardiology

## 2017-02-14 ENCOUNTER — Other Ambulatory Visit: Payer: Self-pay

## 2017-02-14 DIAGNOSIS — R0989 Other specified symptoms and signs involving the circulatory and respiratory systems: Secondary | ICD-10-CM

## 2017-02-15 ENCOUNTER — Encounter (HOSPITAL_COMMUNITY): Payer: Self-pay | Admitting: Cardiology

## 2017-02-16 ENCOUNTER — Other Ambulatory Visit: Payer: Self-pay | Admitting: *Deleted

## 2017-02-16 ENCOUNTER — Ambulatory Visit (INDEPENDENT_AMBULATORY_CARE_PROVIDER_SITE_OTHER): Payer: PPO | Admitting: Vascular Surgery

## 2017-02-16 ENCOUNTER — Encounter: Payer: Self-pay | Admitting: Vascular Surgery

## 2017-02-16 ENCOUNTER — Encounter: Payer: Self-pay | Admitting: *Deleted

## 2017-02-16 VITALS — BP 137/69 | HR 60 | Resp 20 | Ht 71.0 in | Wt 199.0 lb

## 2017-02-16 DIAGNOSIS — I6521 Occlusion and stenosis of right carotid artery: Secondary | ICD-10-CM

## 2017-02-16 NOTE — Progress Notes (Signed)
Patient ID: IBN STIEF, male   DOB: 03/09/1940, 77 y.o.   MRN: [id number]  Reason for Consult: New Patient (Initial Visit) (eval R carotid bruit - ref by Dr. Nadara Eaton)   Referred by Georgann Housekeeper, MD  Subjective:     HPI:  [name] is a 77 y.o. male with significant coronary disease status post bypass surgery as well as subsequent stenting.  He is diabetic with hypertension and hyperlipidemia as risk factors.  He also has atrial fibrillation and is maintained on Coumadin for this.  He recently underwent angiogram of his heart and because of carotid duplex demonstrating high-grade stenosis he also had neck angiogram which demonstrated 90% stenosis of his right ICA.  He denies any stroke TIA or amaurosis in the past.  He remains active does not have chest pain.  He has been cleared from a cardiac standpoint.  He has been told to take aspirin but currently does not take any antiplatelet agents.  Past Medical History:  Diagnosis Date  . Arthritis   . Atrial fibrillation (HCC)    successful cardioversion 12/2013  . Barrett's esophagus   . Chronic kidney disease    CKD  . Coronary artery disease    a. h/o CABG 1997. b. stenting to graft-RCA, CB to LAD after LIMA in 2006. c. stenting to VG-RCA beyond initial stent and in-stent restenosis in 2011. c. Complex PCI 07/2013 to SVG-RCA and distal native RCA involving 4 sites (see cath report).  . Diabetes (HCC)    Type II  . Dyspnea    with exertion  . GERD (gastroesophageal reflux disease)   . Heart murmur   . History of kidney stones   . Hyperlipidemia   . Hypertension   . Myocardial infarction (HCC)   . Pre-syncope    a. 09/2012: in the setting of mild dehydration with significant straining possibly inducing a vagal event while he was dragging heavy logs.   . Sleep apnea    hx of does not use cpap for yr has lost wt   Family History  Problem Relation Age of Onset  . Heart attack Brother   . Heart disease Brother   .  Diabetes Mother   . Heart disease Mother   . Hypertension Mother   . Cancer Father   . Diabetes Sister   . Hypertension Sister   . Heart disease Brother   . Diabetes Brother   . Colon cancer Neg Hx    Past Surgical History:  Procedure Laterality Date  . CARDIOVERSION N/A 12/29/2013   Procedure: CARDIOVERSION;  Surgeon: Laurey Morale, MD;  Location: [facility] ENDOSCOPY;  Service: Cardiovascular;  Laterality: N/A;  . CAROTID ANGIOGRAPHY Bilateral 02/09/2017   Procedure: CAROTID ANGIOGRAPHY;  Surgeon: [name], MD;  Location: [facility] INVASIVE CV LAB;  Service: Cardiovascular;  Laterality: Bilateral;  . COLONOSCOPY W/ POLYPECTOMY    . CORONARY ANGIOPLASTY WITH STENT PLACEMENT    . CORONARY ARTERY BYPASS GRAFT    . CORONARY STENT PLACEMENT  07/30/2013   DES to RCA     DR KELLY  . HERNIA REPAIR Left    Inguinal  . JOINT REPLACEMENT Bilateral    Shoulders  . KIDNEY STONE SURGERY    . LEFT HEART CATH AND CORS/GRAFTS ANGIOGRAPHY N/A 02/09/2017   Procedure: LEFT HEART CATH AND CORS/GRAFTS ANGIOGRAPHY;  Surgeon: [name], MD;  Location: [facility] INVASIVE CV LAB;  Service: Cardiovascular;  Laterality: N/A;  . LEFT HEART CATHETERIZATION WITH CORONARY/GRAFT ANGIOGRAM N/A 07/30/2013  Procedure: LEFT HEART CATHETERIZATION WITH Isabel Caprice;  Surgeon: Lennette Bihari, MD;  Location: [facility] CATH LAB;  Service: Cardiovascular;  Laterality: N/A;  . LITHOTRIPSY    . NEPHROLITHOTOMY     kidney stone  . PERCUTANEOUS CORONARY INTERVENTION-BALLOON ONLY  07/30/2013   Procedure: PERCUTANEOUS CORONARY INTERVENTION-BALLOON ONLY;  Surgeon: Lennette Bihari, MD;  Location: [facility] CATH LAB;  Service: Cardiovascular;;  . PERCUTANEOUS CORONARY STENT INTERVENTION (PCI-S)  07/30/2013   Procedure: PERCUTANEOUS CORONARY STENT INTERVENTION (PCI-S);  Surgeon: Lennette Bihari, MD;  Location: [facility] CATH LAB;  Service: Cardiovascular;;  . SHOULDER SURGERY Bilateral    last 11/17 left  . TOTAL HIP ARTHROPLASTY Left 08/04/2016   Procedure:  LEFT TOTAL HIP ARTHROPLASTY ANTERIOR APPROACH;  Surgeon: Samson Frederic, MD;  Location: [facility] OR;  Service: Orthopedics;  Laterality: Left;  . TOTAL SHOULDER REVISION Left 12/29/2015   Procedure: TOTAL SHOULDER REVISION ARHTROPLASTY;  Surgeon: Beverely Low, MD;  Location: [facility] OR;  Service: Orthopedics;  Laterality: Left;  . UPPER GI ENDOSCOPY      Short Social History:  Social History   Tobacco Use  . Smoking status: Former Smoker    Years: 32.00    Last attempt to quit: 05/16/1996    Years since quitting: 20.7  . Smokeless tobacco: Never Used  Substance Use Topics  . Alcohol use: Yes    Comment: 2-3 beers a month; socially    Allergies  Allergen Reactions  . Ivp Dye [Iodinated Diagnostic Agents] Other (See Comments)    Increased blood pressure, heart rate "pt denies allergy " has had contrast since and no reaction    Current Outpatient Medications  Medication Sig Dispense Refill  . atorvastatin (LIPITOR) 10 MG tablet Take 10 mg by mouth at bedtime.    . carvedilol (COREG) 25 MG tablet Take 25 mg by mouth 2 (two) times daily.     Marland Kitchen diltiazem (CARDIZEM CD) 120 MG 24 hr capsule Take 1 capsule (120 mg total) by mouth 2 (two) times daily before a meal. 60 capsule 5  . glimepiride (AMARYL) 2 MG tablet Take 2 mg by mouth 2 (two) times daily before a meal.     . hydrALAZINE (APRESOLINE) 50 MG tablet Take 50 mg by mouth 3 (three) times daily.    . isosorbide mononitrate (IMDUR) 60 MG 24 hr tablet Take 1 tablet by mouth daily as needed (chest pain).   3  . metFORMIN (GLUCOPHAGE-XR) 500 MG 24 hr tablet Take 1 tablet (500 mg total) by mouth 2 (two) times daily with a meal.    . nitroGLYCERIN (NITROSTAT) 0.4 MG SL tablet Place 0.4 mg under the tongue every 5 (five) minutes as needed for chest pain.    . pantoprazole (PROTONIX) 40 MG tablet Take 40 mg by mouth daily as needed.     . warfarin (COUMADIN) 5 MG tablet Take 1 tablet (5 mg total) by mouth daily. (Patient taking differently: Take 5 mg by  mouth daily at 6 PM. ) 90 tablet 3  . olmesartan (BENICAR) 20 MG tablet Take 1 tablet by mouth daily.  4   No current facility-administered medications for this visit.     Review of Systems  Constitutional:  Constitutional negative. HENT: HENT negative.  Eyes: Eyes negative.  Respiratory: Positive for shortness of breath.  Cardiovascular: Positive for claudication and dyspnea with exertion.  GI: Gastrointestinal negative.  Musculoskeletal: Musculoskeletal negative.  Skin: Skin negative.  Neurological: Neurological negative. Hematologic: Hematologic/lymphatic negative.  Psychiatric: Psychiatric negative.  Objective:  Objective   Vitals:   02/16/17 1207 02/16/17 1208  BP: 138/67 137/69  Pulse: 60   Resp: 20   SpO2: 97%   Weight: 199 lb (90.3 kg)   Height: 5\' 11"  (1.803 m)    Body mass index is 27.75 kg/m.  Physical Exam  Constitutional: He is oriented to person, place, and time. He appears well-developed.  HENT:  Head: Normocephalic.  Eyes: Pupils are equal, round, and reactive to light.  Neck: Normal range of motion.  Cardiovascular: Normal rate.  Pulses:      Radial pulses are 2+ on the right side, and 2+ on the left side.       Popliteal pulses are 2+ on the right side, and 2+ on the left side.  Abdominal: Soft. He exhibits no mass.  Musculoskeletal: Normal range of motion. He exhibits no edema.  Neurological: He is alert and oriented to person, place, and time.  Skin: Skin is warm and dry. No erythema.  Psychiatric: He has a normal mood and affect. His behavior is normal. Judgment and thought content normal.    Data: I reviewed his outside carotid duplex which demonstrated an 89 9% stenosis with velocities peak systolic 470 and EDV 145.  I have also reviewed his angiogram demonstrating approximately 90% stenosis of his right internal carotid artery.     Assessment/Plan:     77 year old male here for evaluation of asymptomatic right internal carotid  artery stenosis.  We discussed the risk and benefits of surgery and also surgery versus stenting.  Specifically we discussed MI, stroke risk of 2%, cranial nerve injuries approximately 5%.  He demonstrates good understanding and wants to proceed with carotid endarterectomy.  We will get him scheduled today in the office.     62 MD [facility]

## 2017-02-19 ENCOUNTER — Encounter (HOSPITAL_COMMUNITY): Payer: Self-pay | Admitting: Cardiology

## 2017-02-19 DIAGNOSIS — Z951 Presence of aortocoronary bypass graft: Secondary | ICD-10-CM | POA: Diagnosis not present

## 2017-02-19 DIAGNOSIS — I6521 Occlusion and stenosis of right carotid artery: Secondary | ICD-10-CM | POA: Diagnosis not present

## 2017-02-19 DIAGNOSIS — I480 Paroxysmal atrial fibrillation: Secondary | ICD-10-CM | POA: Diagnosis not present

## 2017-02-19 DIAGNOSIS — I209 Angina pectoris, unspecified: Secondary | ICD-10-CM | POA: Diagnosis not present

## 2017-02-19 NOTE — Pre-Procedure Instructions (Addendum)
MERLON ALCORTA  02/19/2017    Your procedure is scheduled on Monday, February 26, 2017 at 7:30 AM.   Report to [facility] Entrance "A" Admitting Office at 5:30 AM.   Call this number if you have problems the morning of surgery: [phone]   Questions prior to day of surgery, please call [phone] between 8 & 4 PM.   Remember:  Do not eat food or drink liquids after midnight Sunday, 02/25/17.  Take these medicines the morning of surgery with A SIP OF WATER: Aspirin, Carvedilol (Coreg), Diltiazem (Cardizem), Hydralazine (Apresoline), Isosorbide Mononitrate (Imdur) - if needed, Pantoprazole (Protonix) - if needed  Do not take your Metformin (Glucophage) or Glimepiride (Amaryl) the morning of surgery. Do not take evening dose of Glimepiride Sunday, 02/25/17.  Stop Warfarin (Coumadin) as instructed by Dr. Randie Heinz. Last dose should be 02/22/17.    How to Manage Your Diabetes Before Surgery   Why is it important to control my blood sugar before and after surgery?   Improving blood sugar levels before and after surgery helps healing and can limit problems.  A way of improving blood sugar control is eating a healthy diet by:  - Eating less sugar and carbohydrates  - Increasing activity/exercise  - Talk with your doctor about reaching your blood sugar goals  High blood sugars (greater than 180 mg/dL) can raise your risk of infections and slow down your recovery so you will need to focus on controlling your diabetes during the weeks before surgery.  Make sure that the doctor who takes care of your diabetes knows about your planned surgery including the date and location.  How do I manage my blood sugars before surgery?   Check your blood sugar at least 4 times a day, 2 days before surgery to make sure that they are not too high or low.  Check your blood sugar the morning of your surgery when you wake up and every 2 hours until you get to the Short-Stay unit.  Treat a low  blood sugar (less than 70 mg/dL) with 1/2 cup of clear juice (cranberry or apple), 4 glucose tablets, OR glucose gel.  Recheck blood sugar in 15 minutes after treatment (to make sure it is greater than 70 mg/dL).  If blood sugar is not greater than 70 mg/dL on re-check, call [phone] for further instructions.   Report your blood sugar to the Short-Stay nurse when you get to Short-Stay.  References:  [facility], 2007 "How to Manage your Diabetes Before and After Surgery".   Do not wear jewelry.  Do not wear lotions, powders, cologne or deodorant.  Men may shave face and neck.  Do not bring valuables to the hospital.  [facility] is not responsible for any belongings or valuables.  Contacts, dentures or bridgework may not be worn into surgery.  Leave your suitcase in the car.  After surgery it may be brought to your room.  For patients admitted to the hospital, discharge time will be determined by your treatment team.  [facility] - Preparing for Surgery  Before surgery, you can play an important role.  Because skin is not sterile, your skin needs to be as free of germs as possible.  You can reduce the number of germs on you skin by washing with CHG (chlorahexidine gluconate) soap before surgery.  CHG is an antiseptic cleaner which kills germs and bonds with the skin to continue killing germs even after washing.  Please  DO NOT use if you have an allergy to CHG or antibacterial soaps.  If your skin becomes reddened/irritated stop using the CHG and inform your nurse when you arrive at Short Stay.  Do not shave (including legs and underarms) for at least 48 hours prior to the first CHG shower.  You may shave your face.  Please follow these instructions carefully:   1.  Shower with CHG Soap the night before surgery and the                    morning of Surgery.  2.  If you choose to wash your hair, wash your hair first as usual with your       normal  shampoo.  3.  After you shampoo, rinse your hair and body thoroughly to remove the shampoo.  4.  Use CHG as you would any other liquid soap.  You can apply chg directly       to the skin and wash gently with scrungie or a clean washcloth.  5.  Apply the CHG Soap to your body ONLY FROM THE NECK DOWN.        Do not use on open wounds or open sores.  Avoid contact with your eyes, ears, mouth and genitals (private parts).  Wash genitals (private parts) with your normal soap.  6.  Wash thoroughly, paying special attention to the area where your surgery        will be performed.  7.  Thoroughly rinse your body with warm water from the neck down.  8.  DO NOT shower/wash with your normal soap after using and rinsing off       the CHG Soap.  9.  Pat yourself dry with a clean towel.            10.  Wear clean pajamas.            11.  Place clean sheets on your bed the night of your first shower and do not        sleep with pets.  Day of Surgery  Shower as above. Do not apply any lotions/deodorants the morning of surgery.  Please wear clean clothes to the hospital.   Please read over the fact sheets that you were given.

## 2017-02-20 ENCOUNTER — Other Ambulatory Visit (HOSPITAL_COMMUNITY): Payer: Self-pay | Admitting: *Deleted

## 2017-02-20 ENCOUNTER — Encounter (HOSPITAL_COMMUNITY): Payer: Self-pay

## 2017-02-20 ENCOUNTER — Encounter (HOSPITAL_COMMUNITY)
Admission: RE | Admit: 2017-02-20 | Discharge: 2017-02-20 | Disposition: A | Discharge status: Home / Self Care | Payer: PPO | Source: Ambulatory Visit | Attending: Vascular Surgery | Admitting: Vascular Surgery

## 2017-02-20 ENCOUNTER — Other Ambulatory Visit: Payer: Self-pay

## 2017-02-20 DIAGNOSIS — Z7984 Long term (current) use of oral hypoglycemic drugs: Secondary | ICD-10-CM | POA: Insufficient documentation

## 2017-02-20 DIAGNOSIS — I129 Hypertensive chronic kidney disease with stage 1 through stage 4 chronic kidney disease, or unspecified chronic kidney disease: Secondary | ICD-10-CM | POA: Insufficient documentation

## 2017-02-20 DIAGNOSIS — Z01818 Encounter for other preprocedural examination: Secondary | ICD-10-CM | POA: Diagnosis not present

## 2017-02-20 DIAGNOSIS — E1122 Type 2 diabetes mellitus with diabetic chronic kidney disease: Secondary | ICD-10-CM | POA: Diagnosis not present

## 2017-02-20 DIAGNOSIS — Z7901 Long term (current) use of anticoagulants: Secondary | ICD-10-CM | POA: Diagnosis not present

## 2017-02-20 DIAGNOSIS — Z79899 Other long term (current) drug therapy: Secondary | ICD-10-CM | POA: Diagnosis not present

## 2017-02-20 DIAGNOSIS — Z951 Presence of aortocoronary bypass graft: Secondary | ICD-10-CM | POA: Insufficient documentation

## 2017-02-20 DIAGNOSIS — I4891 Unspecified atrial fibrillation: Secondary | ICD-10-CM | POA: Diagnosis not present

## 2017-02-20 DIAGNOSIS — K219 Gastro-esophageal reflux disease without esophagitis: Secondary | ICD-10-CM | POA: Diagnosis not present

## 2017-02-20 DIAGNOSIS — Z96612 Presence of left artificial shoulder joint: Secondary | ICD-10-CM | POA: Diagnosis not present

## 2017-02-20 DIAGNOSIS — Z87891 Personal history of nicotine dependence: Secondary | ICD-10-CM | POA: Insufficient documentation

## 2017-02-20 DIAGNOSIS — Z96611 Presence of right artificial shoulder joint: Secondary | ICD-10-CM | POA: Diagnosis not present

## 2017-02-20 DIAGNOSIS — I6521 Occlusion and stenosis of right carotid artery: Secondary | ICD-10-CM | POA: Insufficient documentation

## 2017-02-20 DIAGNOSIS — I2510 Atherosclerotic heart disease of native coronary artery without angina pectoris: Secondary | ICD-10-CM | POA: Insufficient documentation

## 2017-02-20 DIAGNOSIS — N189 Chronic kidney disease, unspecified: Secondary | ICD-10-CM | POA: Insufficient documentation

## 2017-02-20 DIAGNOSIS — E785 Hyperlipidemia, unspecified: Secondary | ICD-10-CM | POA: Diagnosis not present

## 2017-02-20 DIAGNOSIS — Z96642 Presence of left artificial hip joint: Secondary | ICD-10-CM | POA: Diagnosis not present

## 2017-02-20 DIAGNOSIS — I252 Old myocardial infarction: Secondary | ICD-10-CM | POA: Insufficient documentation

## 2017-02-20 LAB — COMPREHENSIVE METABOLIC PANEL
ALT: 17 U/L (ref 17–63)
AST: 22 U/L (ref 15–41)
Albumin: 3.7 g/dL (ref 3.5–5.0)
Alkaline Phosphatase: 100 U/L (ref 38–126)
Anion gap: 10 (ref 5–15)
BILIRUBIN TOTAL: 1 mg/dL (ref 0.3–1.2)
BUN: 28 mg/dL — AB (ref 6–20)
CHLORIDE: 104 mmol/L (ref 101–111)
CO2: 23 mmol/L (ref 22–32)
Calcium: 9.4 mg/dL (ref 8.9–10.3)
Creatinine, Ser: 1.25 mg/dL — ABNORMAL HIGH (ref 0.61–1.24)
GFR calc Af Amer: >60 mL/min (ref >60)
GFR calc non Af Amer: 54 mL/min — ABNORMAL LOW (ref >60)
Glucose, Bld: 100 mg/dL — ABNORMAL HIGH (ref 65–99)
POTASSIUM: 4.4 mmol/L (ref 3.5–5.1)
Sodium: 137 mmol/L (ref 135–145)
TOTAL PROTEIN: 6.5 g/dL (ref 6.5–8.1)

## 2017-02-20 LAB — URINALYSIS, ROUTINE W REFLEX MICROSCOPIC
BILIRUBIN URINE: NEGATIVE
GLUCOSE, UA: NEGATIVE mg/dL
Hgb urine dipstick: NEGATIVE
Ketones, ur: NEGATIVE mg/dL
Leukocytes, UA: NEGATIVE
NITRITE: NEGATIVE
PH: 5 (ref 5.0–8.0)
Protein, ur: NEGATIVE mg/dL
SPECIFIC GRAVITY, URINE: 1.015 (ref 1.005–1.030)

## 2017-02-20 LAB — CBC
HEMATOCRIT: 32.7 % — AB (ref 39.0–52.0)
Hemoglobin: 10.5 g/dL — ABNORMAL LOW (ref 13.0–17.0)
MCH: 29.7 pg (ref 26.0–34.0)
MCHC: 32.1 g/dL (ref 30.0–36.0)
MCV: 92.6 fL (ref 78.0–100.0)
PLATELETS: 235 10*3/uL (ref 150–400)
RBC: 3.53 MIL/uL — ABNORMAL LOW (ref 4.22–5.81)
RDW: 15.1 % (ref 11.5–15.5)
WBC: 9.4 10*3/uL (ref 4.0–10.5)

## 2017-02-20 LAB — GLUCOSE, CAPILLARY: Glucose-Capillary: 85 mg/dL (ref 65–99)

## 2017-02-20 LAB — PROTIME-INR
INR: 1.13
Prothrombin Time: 14.4 seconds (ref 11.4–15.2)

## 2017-02-20 LAB — HEMOGLOBIN A1C
HEMOGLOBIN A1C: 6 % — AB (ref 4.8–5.6)
Mean Plasma Glucose: 125.5 mg/dL

## 2017-02-20 LAB — SURGICAL PCR SCREEN
MRSA, PCR: NEGATIVE
STAPHYLOCOCCUS AUREUS: NEGATIVE

## 2017-02-20 LAB — APTT: aPTT: 28 seconds (ref 24–36)

## 2017-02-20 NOTE — Progress Notes (Signed)
Pt has extensive cardiac history, Dr. Jacinto Halim is his cardiologist. Dr. Jacinto Halim referred pt to Dr. Randie Heinz. Pt denies any recent chest pain or sob. Pt was instructed to stop Coumadin prior to surgery (Dr. Jacinto Halim told him last dose should be 02/21/17). Pt is a a type 2 diabetic. Pt and wife not sure when his last A1C was. Pt states his fasting blood sugar is usually between 80-90.

## 2017-02-25 NOTE — Anesthesia Preprocedure Evaluation (Addendum)
Anesthesia Evaluation  Patient identified by MRN, date of birth, ID band Patient awake    Reviewed: Allergy & Precautions, NPO status , Patient's Chart, lab work & pertinent test results  Airway Mallampati: II  TM Distance: >3 FB Neck ROM: Full    Dental  (+) Teeth Intact   Pulmonary shortness of breath, former smoker,    breath sounds clear to auscultation       Cardiovascular hypertension, + CAD, + Past MI, + Cardiac Stents, + CABG and +CHF   Rhythm:Regular Rate:Normal     Neuro/Psych    GI/Hepatic GERD  ,  Endo/Other  diabetes  Renal/GU Renal InsufficiencyRenal disease     Musculoskeletal  (+) Arthritis ,   Abdominal   Peds  Hematology  (+) anemia ,   Anesthesia Other Findings   Reproductive/Obstetrics                            Anesthesia Physical Anesthesia Plan  ASA: III  Anesthesia Plan: General   Post-op Pain Management:    Induction: Intravenous  PONV Risk Score and Plan: 3 and Dexamethasone, Ondansetron and Treatment may vary due to age or medical condition  Airway Management Planned: Oral ETT  Additional Equipment: Arterial line  Intra-op Plan:   Post-operative Plan: Extubation in OR  Informed Consent: I have reviewed the patients History and Physical, chart, labs and discussed the procedure including the risks, benefits and alternatives for the proposed anesthesia with the patient or authorized representative who has indicated his/her understanding and acceptance.   Dental advisory given  Plan Discussed with:   Anesthesia Plan Comments:         Anesthesia Quick Evaluation

## 2017-02-26 ENCOUNTER — Inpatient Hospital Stay (HOSPITAL_COMMUNITY)
Admission: RE | Admit: 2017-02-26 | Discharge: 2017-03-16 | DRG: 034 | Disposition: E | Payer: PPO | Source: Ambulatory Visit | Attending: Interventional Radiology | Admitting: Interventional Radiology

## 2017-02-26 ENCOUNTER — Inpatient Hospital Stay (HOSPITAL_COMMUNITY): Payer: PPO

## 2017-02-26 ENCOUNTER — Other Ambulatory Visit: Payer: Self-pay

## 2017-02-26 ENCOUNTER — Encounter (HOSPITAL_COMMUNITY): Payer: Self-pay | Admitting: *Deleted

## 2017-02-26 ENCOUNTER — Inpatient Hospital Stay (HOSPITAL_COMMUNITY): Payer: PPO | Admitting: Certified Registered"

## 2017-02-26 ENCOUNTER — Encounter (HOSPITAL_COMMUNITY): Admission: RE | Disposition: E | Payer: Self-pay | Source: Ambulatory Visit | Attending: Interventional Radiology

## 2017-02-26 DIAGNOSIS — Z955 Presence of coronary angioplasty implant and graft: Secondary | ICD-10-CM

## 2017-02-26 DIAGNOSIS — Z978 Presence of other specified devices: Secondary | ICD-10-CM | POA: Diagnosis not present

## 2017-02-26 DIAGNOSIS — N189 Chronic kidney disease, unspecified: Secondary | ICD-10-CM | POA: Diagnosis not present

## 2017-02-26 DIAGNOSIS — I671 Cerebral aneurysm, nonruptured: Secondary | ICD-10-CM | POA: Diagnosis not present

## 2017-02-26 DIAGNOSIS — E1151 Type 2 diabetes mellitus with diabetic peripheral angiopathy without gangrene: Secondary | ICD-10-CM | POA: Diagnosis not present

## 2017-02-26 DIAGNOSIS — Z96611 Presence of right artificial shoulder joint: Secondary | ICD-10-CM | POA: Diagnosis present

## 2017-02-26 DIAGNOSIS — Z4659 Encounter for fitting and adjustment of other gastrointestinal appliance and device: Secondary | ICD-10-CM

## 2017-02-26 DIAGNOSIS — Z951 Presence of aortocoronary bypass graft: Secondary | ICD-10-CM

## 2017-02-26 DIAGNOSIS — I4901 Ventricular fibrillation: Secondary | ICD-10-CM | POA: Diagnosis not present

## 2017-02-26 DIAGNOSIS — Z87442 Personal history of urinary calculi: Secondary | ICD-10-CM | POA: Diagnosis not present

## 2017-02-26 DIAGNOSIS — I9788 Other intraoperative complications of the circulatory system, not elsewhere classified: Secondary | ICD-10-CM | POA: Diagnosis not present

## 2017-02-26 DIAGNOSIS — Z91041 Radiographic dye allergy status: Secondary | ICD-10-CM

## 2017-02-26 DIAGNOSIS — I25709 Atherosclerosis of coronary artery bypass graft(s), unspecified, with unspecified angina pectoris: Secondary | ICD-10-CM | POA: Diagnosis not present

## 2017-02-26 DIAGNOSIS — J9600 Acute respiratory failure, unspecified whether with hypoxia or hypercapnia: Secondary | ICD-10-CM | POA: Diagnosis not present

## 2017-02-26 DIAGNOSIS — D62 Acute posthemorrhagic anemia: Secondary | ICD-10-CM | POA: Diagnosis not present

## 2017-02-26 DIAGNOSIS — G4733 Obstructive sleep apnea (adult) (pediatric): Secondary | ICD-10-CM | POA: Diagnosis not present

## 2017-02-26 DIAGNOSIS — Z8249 Family history of ischemic heart disease and other diseases of the circulatory system: Secondary | ICD-10-CM

## 2017-02-26 DIAGNOSIS — Z833 Family history of diabetes mellitus: Secondary | ICD-10-CM

## 2017-02-26 DIAGNOSIS — Z87891 Personal history of nicotine dependence: Secondary | ICD-10-CM | POA: Diagnosis not present

## 2017-02-26 DIAGNOSIS — Z4589 Encounter for adjustment and management of other implanted devices: Secondary | ICD-10-CM | POA: Diagnosis not present

## 2017-02-26 DIAGNOSIS — Y92230 Patient room in hospital as the place of occurrence of the external cause: Secondary | ICD-10-CM | POA: Diagnosis not present

## 2017-02-26 DIAGNOSIS — I2510 Atherosclerotic heart disease of native coronary artery without angina pectoris: Secondary | ICD-10-CM | POA: Diagnosis not present

## 2017-02-26 DIAGNOSIS — I462 Cardiac arrest due to underlying cardiac condition: Secondary | ICD-10-CM | POA: Diagnosis not present

## 2017-02-26 DIAGNOSIS — I6521 Occlusion and stenosis of right carotid artery: Secondary | ICD-10-CM | POA: Diagnosis not present

## 2017-02-26 DIAGNOSIS — Z7984 Long term (current) use of oral hypoglycemic drugs: Secondary | ICD-10-CM | POA: Diagnosis not present

## 2017-02-26 DIAGNOSIS — I129 Hypertensive chronic kidney disease with stage 1 through stage 4 chronic kidney disease, or unspecified chronic kidney disease: Secondary | ICD-10-CM | POA: Diagnosis present

## 2017-02-26 DIAGNOSIS — L7632 Postprocedural hematoma of skin and subcutaneous tissue following other procedure: Secondary | ICD-10-CM | POA: Diagnosis not present

## 2017-02-26 DIAGNOSIS — E782 Mixed hyperlipidemia: Secondary | ICD-10-CM | POA: Diagnosis present

## 2017-02-26 DIAGNOSIS — Y838 Other surgical procedures as the cause of abnormal reaction of the patient, or of later complication, without mention of misadventure at the time of the procedure: Secondary | ICD-10-CM | POA: Diagnosis not present

## 2017-02-26 DIAGNOSIS — E1122 Type 2 diabetes mellitus with diabetic chronic kidney disease: Secondary | ICD-10-CM | POA: Diagnosis not present

## 2017-02-26 DIAGNOSIS — I25119 Atherosclerotic heart disease of native coronary artery with unspecified angina pectoris: Secondary | ICD-10-CM | POA: Diagnosis not present

## 2017-02-26 DIAGNOSIS — I252 Old myocardial infarction: Secondary | ICD-10-CM

## 2017-02-26 DIAGNOSIS — Z96642 Presence of left artificial hip joint: Secondary | ICD-10-CM | POA: Diagnosis present

## 2017-02-26 DIAGNOSIS — Z7901 Long term (current) use of anticoagulants: Secondary | ICD-10-CM | POA: Diagnosis not present

## 2017-02-26 DIAGNOSIS — G903 Multi-system degeneration of the autonomic nervous system: Secondary | ICD-10-CM | POA: Diagnosis not present

## 2017-02-26 DIAGNOSIS — I7771 Dissection of carotid artery: Secondary | ICD-10-CM | POA: Diagnosis not present

## 2017-02-26 DIAGNOSIS — K219 Gastro-esophageal reflux disease without esophagitis: Secondary | ICD-10-CM | POA: Diagnosis present

## 2017-02-26 DIAGNOSIS — J9811 Atelectasis: Secondary | ICD-10-CM | POA: Diagnosis not present

## 2017-02-26 DIAGNOSIS — Z9889 Other specified postprocedural states: Secondary | ICD-10-CM

## 2017-02-26 DIAGNOSIS — I959 Hypotension, unspecified: Secondary | ICD-10-CM | POA: Diagnosis not present

## 2017-02-26 DIAGNOSIS — I480 Paroxysmal atrial fibrillation: Secondary | ICD-10-CM | POA: Diagnosis present

## 2017-02-26 DIAGNOSIS — I6522 Occlusion and stenosis of left carotid artery: Secondary | ICD-10-CM | POA: Diagnosis not present

## 2017-02-26 DIAGNOSIS — Z96612 Presence of left artificial shoulder joint: Secondary | ICD-10-CM | POA: Diagnosis present

## 2017-02-26 HISTORY — PX: IR INTRAVSC STENT CERV CAROTID W/O EMB-PROT MOD SED INC ANGIO: IMG2304

## 2017-02-26 HISTORY — PX: IR ANGIO VERTEBRAL SEL SUBCLAVIAN INNOMINATE UNI R MOD SED: IMG5365

## 2017-02-26 HISTORY — PX: PATCH ANGIOPLASTY: SHX6230

## 2017-02-26 HISTORY — PX: IR CT HEAD LTD: IMG2386

## 2017-02-26 HISTORY — PX: ENDARTERECTOMY: SHX5162

## 2017-02-26 HISTORY — PX: IR ANGIO INTRA EXTRACRAN SEL COM CAROTID INNOMINATE UNI L MOD SED: IMG5358

## 2017-02-26 LAB — APTT: APTT: 117 s — AB (ref 24–36)

## 2017-02-26 LAB — CBC WITH DIFFERENTIAL/PLATELET
BASOS ABS: 0 10*3/uL (ref 0.0–0.1)
Basophils Absolute: 0 10*3/uL (ref 0.0–0.1)
Basophils Relative: 0 %
Basophils Relative: 0 %
EOS ABS: 0 10*3/uL (ref 0.0–0.7)
EOS ABS: 0 10*3/uL (ref 0.0–0.7)
EOS PCT: 0 %
EOS PCT: 0 %
HCT: 26.4 % — ABNORMAL LOW (ref 39.0–52.0)
HCT: 24 % — ABNORMAL LOW (ref 39.0–52.0)
HEMOGLOBIN: 8.8 g/dL — AB (ref 13.0–17.0)
Hemoglobin: 7.8 g/dL — ABNORMAL LOW (ref 13.0–17.0)
LYMPHS ABS: 1.2 10*3/uL (ref 0.7–4.0)
Lymphocytes Relative: 12 %
Lymphocytes Relative: 14 %
Lymphs Abs: 1 10*3/uL (ref 0.7–4.0)
MCH: 30.9 pg (ref 26.0–34.0)
MCH: 30.1 pg (ref 26.0–34.0)
MCHC: 33.3 g/dL (ref 30.0–36.0)
MCHC: 32.5 g/dL (ref 30.0–36.0)
MCV: 92.6 fL (ref 78.0–100.0)
MCV: 92.7 fL (ref 78.0–100.0)
MONO ABS: 0.3 10*3/uL (ref 0.1–1.0)
MONO ABS: 0.1 10*3/uL (ref 0.1–1.0)
MONOS PCT: 3 %
Monocytes Relative: 2 %
NEUTROS PCT: 85 %
Neutro Abs: 8.7 10*3/uL — ABNORMAL HIGH (ref 1.7–7.7)
Neutro Abs: 6 10*3/uL (ref 1.7–7.7)
Neutrophils Relative %: 84 %
PLATELETS: 226 10*3/uL (ref 150–400)
Platelets: 218 10*3/uL (ref 150–400)
RBC: 2.85 MIL/uL — ABNORMAL LOW (ref 4.22–5.81)
RBC: 2.59 MIL/uL — AB (ref 4.22–5.81)
RDW: 15.1 % (ref 11.5–15.5)
RDW: 15.3 % (ref 11.5–15.5)
WBC: 10.1 10*3/uL (ref 4.0–10.5)
WBC: 7.2 10*3/uL (ref 4.0–10.5)

## 2017-02-26 LAB — POCT ACTIVATED CLOTTING TIME
ACTIVATED CLOTTING TIME: 219 s
ACTIVATED CLOTTING TIME: 241 s
ACTIVATED CLOTTING TIME: 202 s
ACTIVATED CLOTTING TIME: 202 s
Activated Clotting Time: 241 seconds
Activated Clotting Time: 202 seconds
Activated Clotting Time: 219 seconds

## 2017-02-26 LAB — POCT I-STAT 4, (NA,K, GLUC, HGB,HCT)
Glucose, Bld: 139 mg/dL — ABNORMAL HIGH (ref 65–99)
HCT: 25 % — ABNORMAL LOW (ref 39.0–52.0)
Hemoglobin: 8.5 g/dL — ABNORMAL LOW (ref 13.0–17.0)
Potassium: 4.4 mmol/L (ref 3.5–5.1)
SODIUM: 139 mmol/L (ref 135–145)

## 2017-02-26 LAB — GLUCOSE, CAPILLARY
GLUCOSE-CAPILLARY: 173 mg/dL — AB (ref 65–99)
Glucose-Capillary: 124 mg/dL — ABNORMAL HIGH (ref 65–99)
Glucose-Capillary: 172 mg/dL — ABNORMAL HIGH (ref 65–99)

## 2017-02-26 LAB — POCT I-STAT 3, ART BLOOD GAS (G3+)
ACID-BASE DEFICIT: 2 mmol/L (ref 0.0–2.0)
Bicarbonate: 23.2 mmol/L (ref 20.0–28.0)
O2 SAT: 100 %
PCO2 ART: 36 mmHg (ref 32.0–48.0)
PO2 ART: 392 mmHg — AB (ref 83.0–108.0)
TCO2: 24 mmol/L (ref 22–32)
pH, Arterial: 7.408 (ref 7.350–7.450)

## 2017-02-26 LAB — TRIGLYCERIDES: TRIGLYCERIDES: 55 mg/dL (ref <150)

## 2017-02-26 LAB — PREPARE RBC (CROSSMATCH)

## 2017-02-26 SURGERY — ENDARTERECTOMY, CAROTID
Anesthesia: General | Site: Neck | Laterality: Right

## 2017-02-26 MED ORDER — SODIUM CHLORIDE 0.9 % IV SOLN: Freq: Once | INTRAVENOUS | Status: DC

## 2017-02-26 MED ORDER — ASPIRIN EC 81 MG PO TBEC
81.0000 mg | DELAYED_RELEASE_TABLET | Freq: Every day | ORAL | Status: DC
  Administered 2017-02-27: 81 mg via ORAL
  Filled 2017-02-26: qty 1

## 2017-02-26 MED ORDER — PROPOFOL 1000 MG/100ML IV EMUL
0.0000 ug/kg/min | INTRAVENOUS | Status: DC
  Administered 2017-02-26 (×3): 50 ug/kg/min via INTRAVENOUS
  Administered 2017-02-27 (×2): 40 ug/kg/min via INTRAVENOUS
  Filled 2017-02-26 (×5): qty 100

## 2017-02-26 MED ORDER — ONDANSETRON HCL 4 MG/2ML IJ SOLN
INTRAMUSCULAR | Status: AC
  Filled 2017-02-26: qty 2

## 2017-02-26 MED ORDER — ASPIRIN 81 MG PO CHEW
CHEWABLE_TABLET | ORAL | Status: AC
  Filled 2017-02-26: qty 1

## 2017-02-26 MED ORDER — PHENYLEPHRINE 40 MCG/ML (10ML) SYRINGE FOR IV PUSH (FOR BLOOD PRESSURE SUPPORT)
PREFILLED_SYRINGE | INTRAVENOUS | Status: AC
  Filled 2017-02-26: qty 10

## 2017-02-26 MED ORDER — ACETAMINOPHEN 160 MG/5ML PO SOLN: 650.0000 mg | ORAL | Status: DC | PRN

## 2017-02-26 MED ORDER — ORAL CARE MOUTH RINSE
15.0000 mL | Freq: Four times a day (QID) | OROMUCOSAL | Status: DC
  Administered 2017-02-26: 15 mL via OROMUCOSAL

## 2017-02-26 MED ORDER — FENTANYL CITRATE (PF) 100 MCG/2ML IJ SOLN: 50.0000 ug | INTRAMUSCULAR | Status: DC | PRN

## 2017-02-26 MED ORDER — IOPAMIDOL (ISOVUE-300) INJECTION 61%
INTRAVENOUS | Status: AC
  Administered 2017-02-26: 65 mL
  Filled 2017-02-26: qty 150

## 2017-02-26 MED ORDER — DEXTROSE 5 % IV SOLN
1.5000 g | INTRAVENOUS | Status: AC
  Administered 2017-02-26: 1.5 g via INTRAVENOUS

## 2017-02-26 MED ORDER — GLYCOPYRROLATE 0.2 MG/ML IJ SOLN
INTRAMUSCULAR | Status: DC | PRN
  Administered 2017-02-26: 0.2 mg via INTRAVENOUS

## 2017-02-26 MED ORDER — IPRATROPIUM-ALBUTEROL 0.5-2.5 (3) MG/3ML IN SOLN
3.0000 mL | Freq: Four times a day (QID) | RESPIRATORY_TRACT | Status: DC
  Administered 2017-02-26 – 2017-02-27 (×6): 3 mL via RESPIRATORY_TRACT
  Filled 2017-02-26 (×6): qty 3

## 2017-02-26 MED ORDER — CHLORHEXIDINE GLUCONATE CLOTH 2 % EX PADS: 6.0000 | MEDICATED_PAD | Freq: Once | CUTANEOUS | Status: DC

## 2017-02-26 MED ORDER — HEPARIN (PORCINE) IN NACL 100-0.45 UNIT/ML-% IJ SOLN: 700.0000 [IU]/h | INTRAMUSCULAR | Status: DC

## 2017-02-26 MED ORDER — ROCURONIUM BROMIDE 10 MG/ML (PF) SYRINGE
PREFILLED_SYRINGE | INTRAVENOUS | Status: AC
  Filled 2017-02-26: qty 5

## 2017-02-26 MED ORDER — ROCURONIUM BROMIDE 100 MG/10ML IV SOLN
INTRAVENOUS | Status: DC | PRN
  Administered 2017-02-26: 10 mg via INTRAVENOUS
  Administered 2017-02-26 (×2): 20 mg via INTRAVENOUS
  Administered 2017-02-26: 50 mg via INTRAVENOUS
  Administered 2017-02-26 (×2): 10 mg via INTRAVENOUS
  Administered 2017-02-26 (×2): 20 mg via INTRAVENOUS
  Administered 2017-02-26 (×2): 10 mg via INTRAVENOUS

## 2017-02-26 MED ORDER — HEPARIN (PORCINE) IN NACL 100-0.45 UNIT/ML-% IJ SOLN
500.0000 [IU]/h | INTRAMUSCULAR | Status: DC
  Filled 2017-02-26: qty 250

## 2017-02-26 MED ORDER — TICAGRELOR 90 MG PO TABS
90.0000 mg | ORAL_TABLET | Freq: Two times a day (BID) | ORAL | Status: DC
  Administered 2017-02-27: 90 mg via ORAL
  Filled 2017-02-26 (×2): qty 1

## 2017-02-26 MED ORDER — CHLORHEXIDINE GLUCONATE 0.12% ORAL RINSE (MEDLINE KIT): 15.0000 mL | Freq: Two times a day (BID) | OROMUCOSAL | Status: DC

## 2017-02-26 MED ORDER — NICARDIPINE HCL IN NACL 20-0.86 MG/200ML-% IV SOLN
0.0000 mg/h | INTRAVENOUS | Status: DC
  Administered 2017-02-27 (×3): 5 mg/h via INTRAVENOUS
  Filled 2017-02-26 (×3): qty 200

## 2017-02-26 MED ORDER — PROPOFOL 10 MG/ML IV BOLUS: INTRAVENOUS | Status: DC | PRN

## 2017-02-26 MED ORDER — PROTAMINE SULFATE 10 MG/ML IV SOLN
INTRAVENOUS | Status: AC
  Filled 2017-02-26: qty 5

## 2017-02-26 MED ORDER — SODIUM CHLORIDE 0.9 % IV SOLN
INTRAVENOUS | Status: DC
  Administered 2017-02-26 – 2017-02-27 (×3): via INTRAVENOUS

## 2017-02-26 MED ORDER — HEPARIN (PORCINE) IN NACL 100-0.45 UNIT/ML-% IJ SOLN
700.0000 [IU]/h | INTRAMUSCULAR | Status: DC
  Administered 2017-02-26: 700 [IU]/h via INTRAVENOUS

## 2017-02-26 MED ORDER — DIPHENHYDRAMINE HCL 50 MG/ML IJ SOLN
INTRAMUSCULAR | Status: DC | PRN
  Administered 2017-02-26: 25 mg via INTRAVENOUS

## 2017-02-26 MED ORDER — TICAGRELOR 90 MG PO TABS
180.0000 mg | ORAL_TABLET | Freq: Once | ORAL | Status: AC
  Administered 2017-02-26: 180 mg via ORAL

## 2017-02-26 MED ORDER — PANTOPRAZOLE SODIUM 40 MG IV SOLR
40.0000 mg | Freq: Every day | INTRAVENOUS | Status: DC
  Administered 2017-02-26 – 2017-02-27 (×2): 40 mg via INTRAVENOUS
  Filled 2017-02-26 (×2): qty 40

## 2017-02-26 MED ORDER — BISACODYL 10 MG RE SUPP: 10.0000 mg | Freq: Every day | RECTAL | Status: DC | PRN

## 2017-02-26 MED ORDER — IODIXANOL 320 MG/ML IV SOLN
INTRAVENOUS | Status: DC | PRN
  Administered 2017-02-26: 100 mL via INTRAVENOUS

## 2017-02-26 MED ORDER — LIDOCAINE 2% (20 MG/ML) 5 ML SYRINGE
INTRAMUSCULAR | Status: AC
  Filled 2017-02-26: qty 5

## 2017-02-26 MED ORDER — PROPOFOL 500 MG/50ML IV EMUL
INTRAVENOUS | Status: DC | PRN
  Administered 2017-02-26: 75 ug/kg/min via INTRAVENOUS

## 2017-02-26 MED ORDER — MIDAZOLAM HCL 2 MG/2ML IJ SOLN
INTRAMUSCULAR | Status: AC
  Filled 2017-02-26: qty 2

## 2017-02-26 MED ORDER — PROTAMINE SULFATE 10 MG/ML IV SOLN
INTRAVENOUS | Status: DC | PRN
  Administered 2017-02-26: 5 mg via INTRAVENOUS

## 2017-02-26 MED ORDER — ACETAMINOPHEN 325 MG PO TABS: 650.0000 mg | ORAL_TABLET | ORAL | Status: DC | PRN

## 2017-02-26 MED ORDER — HEPARIN SODIUM (PORCINE) 1000 UNIT/ML IJ SOLN
INTRAMUSCULAR | Status: DC | PRN
  Administered 2017-02-26 (×2): 1000 [IU] via INTRAVENOUS
  Administered 2017-02-26: 10000 [IU] via INTRAVENOUS
  Administered 2017-02-26: 3000 [IU] via INTRAVENOUS
  Administered 2017-02-26: 5000 [IU] via INTRAVENOUS
  Administered 2017-02-26: 2000 [IU] via INTRAVENOUS

## 2017-02-26 MED ORDER — LACTATED RINGERS IV SOLN: INTRAVENOUS | Status: DC | PRN

## 2017-02-26 MED ORDER — 0.9 % SODIUM CHLORIDE (POUR BTL) OPTIME
TOPICAL | Status: DC | PRN
  Administered 2017-02-26: 2000 mL
  Administered 2017-02-26: 1000 mL

## 2017-02-26 MED ORDER — CHLORHEXIDINE GLUCONATE 0.12% ORAL RINSE (MEDLINE KIT)
15.0000 mL | Freq: Two times a day (BID) | OROMUCOSAL | Status: DC
  Administered 2017-02-26 – 2017-02-27 (×3): 15 mL via OROMUCOSAL

## 2017-02-26 MED ORDER — SODIUM CHLORIDE 0.9 % IV SOLN: INTRAVENOUS | Status: DC

## 2017-02-26 MED ORDER — LIDOCAINE HCL (CARDIAC) 20 MG/ML IV SOLN
INTRAVENOUS | Status: DC | PRN
  Administered 2017-02-26: 100 mg via INTRAVENOUS

## 2017-02-26 MED ORDER — ONDANSETRON HCL 4 MG/2ML IJ SOLN
INTRAMUSCULAR | Status: DC | PRN
  Administered 2017-02-26: 4 mg via INTRAVENOUS

## 2017-02-26 MED ORDER — LACTATED RINGERS IV SOLN
INTRAVENOUS | Status: DC | PRN
  Administered 2017-02-26 (×3): via INTRAVENOUS

## 2017-02-26 MED ORDER — SODIUM CHLORIDE 0.9 % IV SOLN
0.0000 ug/min | INTRAVENOUS | Status: DC
  Administered 2017-02-26: 30 ug/min via INTRAVENOUS
  Administered 2017-02-26 – 2017-02-27 (×3): 60 ug/min via INTRAVENOUS
  Administered 2017-02-27: 40 ug/min via INTRAVENOUS
  Filled 2017-02-26 (×3): qty 1
  Filled 2017-02-26: qty 10
  Filled 2017-02-26: qty 1
  Filled 2017-02-26: qty 10

## 2017-02-26 MED ORDER — INSULIN ASPART 100 UNIT/ML ~~LOC~~ SOLN
1.0000 [IU] | SUBCUTANEOUS | Status: DC
  Administered 2017-02-26 – 2017-02-27 (×4): 2 [IU] via SUBCUTANEOUS
  Administered 2017-02-27 (×3): 1 [IU] via SUBCUTANEOUS

## 2017-02-26 MED ORDER — SODIUM CHLORIDE 0.9 % IV SOLN
INTRAVENOUS | Status: DC | PRN
  Administered 2017-02-26: 07:00:00

## 2017-02-26 MED ORDER — PROPOFOL 10 MG/ML IV BOLUS
INTRAVENOUS | Status: AC
  Filled 2017-02-26: qty 20

## 2017-02-26 MED ORDER — EPHEDRINE SULFATE 50 MG/ML IJ SOLN
INTRAMUSCULAR | Status: DC | PRN
  Administered 2017-02-26 (×3): 5 mg via INTRAVENOUS

## 2017-02-26 MED ORDER — HEMOSTATIC AGENTS (NO CHARGE) OPTIME
TOPICAL | Status: DC | PRN
  Administered 2017-02-26: 1 via TOPICAL

## 2017-02-26 MED ORDER — DEXTROSE 5 % IV SOLN
INTRAVENOUS | Status: DC | PRN
  Administered 2017-02-26: 10 ug/min via INTRAVENOUS

## 2017-02-26 MED ORDER — CEFUROXIME SODIUM 1.5 G IV SOLR
INTRAVENOUS | Status: AC
  Filled 2017-02-26: qty 1.5

## 2017-02-26 MED ORDER — FENTANYL CITRATE (PF) 250 MCG/5ML IJ SOLN
INTRAMUSCULAR | Status: AC
Start: 2017-02-26 — End: ?
  Filled 2017-02-26: qty 5

## 2017-02-26 MED ORDER — DIPHENHYDRAMINE HCL 50 MG/ML IJ SOLN
INTRAMUSCULAR | Status: AC
  Filled 2017-02-26: qty 1

## 2017-02-26 MED ORDER — LIDOCAINE HCL 1 % IJ SOLN
INTRAMUSCULAR | Status: AC
  Filled 2017-02-26: qty 20

## 2017-02-26 MED ORDER — TICAGRELOR 90 MG PO TABS
ORAL_TABLET | ORAL | Status: AC
  Administered 2017-02-26: 180 mg via ORAL
  Filled 2017-02-26: qty 2

## 2017-02-26 MED ORDER — EPTIFIBATIDE 20 MG/10ML IV SOLN
INTRAVENOUS | Status: AC
  Administered 2017-02-26 (×2): 1.5 mg
  Filled 2017-02-26: qty 10

## 2017-02-26 MED ORDER — FENTANYL CITRATE (PF) 100 MCG/2ML IJ SOLN
INTRAMUSCULAR | Status: DC | PRN
  Administered 2017-02-26: 50 ug via INTRAVENOUS
  Administered 2017-02-26: 25 ug via INTRAVENOUS

## 2017-02-26 MED ORDER — ONDANSETRON HCL 4 MG/2ML IJ SOLN: 4.0000 mg | Freq: Four times a day (QID) | INTRAMUSCULAR | Status: DC | PRN

## 2017-02-26 MED ORDER — DEXAMETHASONE SODIUM PHOSPHATE 10 MG/ML IJ SOLN
INTRAMUSCULAR | Status: DC | PRN
  Administered 2017-02-26: 5 mg via INTRAVENOUS

## 2017-02-26 MED ORDER — LIDOCAINE HCL (PF) 1 % IJ SOLN
INTRAMUSCULAR | Status: AC
  Filled 2017-02-26: qty 5

## 2017-02-26 MED ORDER — FENTANYL CITRATE (PF) 100 MCG/2ML IJ SOLN: 25.0000 ug | INTRAMUSCULAR | Status: DC | PRN

## 2017-02-26 MED ORDER — NITROGLYCERIN 1 MG/10 ML FOR IR/CATH LAB
INTRA_ARTERIAL | Status: AC
  Filled 2017-02-26: qty 10

## 2017-02-26 MED ORDER — PROPOFOL 10 MG/ML IV BOLUS
INTRAVENOUS | Status: DC | PRN
  Administered 2017-02-26: 100 mg via INTRAVENOUS

## 2017-02-26 MED ORDER — ACETAMINOPHEN 650 MG RE SUPP: 650.0000 mg | RECTAL | Status: DC | PRN

## 2017-02-26 MED ORDER — DOCUSATE SODIUM 50 MG/5ML PO LIQD: 100.0000 mg | Freq: Two times a day (BID) | ORAL | Status: DC | PRN

## 2017-02-26 SURGICAL SUPPLY — 61 items
BIOPATCH RED 1 DISK 7.0 (GAUZE/BANDAGES/DRESSINGS) ×2 IMPLANT
BIOPATCH RED 1IN DISK 7.0MM (GAUZE/BANDAGES/DRESSINGS) ×1
CANISTER SUCT 3000ML PPV (MISCELLANEOUS) ×3 IMPLANT
CATH ROBINSON RED A/P 18FR (CATHETERS) ×3 IMPLANT
CLIP VESOCCLUDE MED 24/CT (CLIP) ×3 IMPLANT
CLIP VESOCCLUDE SM WIDE 24/CT (CLIP) ×3 IMPLANT
COVER PROBE W GEL 5X96 (DRAPES) ×3 IMPLANT
CRADLE DONUT ADULT HEAD (MISCELLANEOUS) ×3 IMPLANT
DERMABOND ADVANCED (GAUZE/BANDAGES/DRESSINGS) ×2
DERMABOND ADVANCED .7 DNX12 (GAUZE/BANDAGES/DRESSINGS) ×1 IMPLANT
DRAIN CHANNEL 10M FLAT 3/4 FLT (DRAIN) ×3 IMPLANT
DRAIN CHANNEL 15F RND FF W/TCR (WOUND CARE) IMPLANT
DRAPE C-ARM 42X72 X-RAY (DRAPES) ×3 IMPLANT
ELECT REM PT RETURN 9FT ADLT (ELECTROSURGICAL) ×3
ELECTRODE REM PT RTRN 9FT ADLT (ELECTROSURGICAL) ×1 IMPLANT
EVACUATOR SILICONE 100CC (DRAIN) ×3 IMPLANT
GAUZE SPONGE 4X4 12PLY STRL LF (GAUZE/BANDAGES/DRESSINGS) ×3 IMPLANT
GLOVE BIO SURGEON STRL SZ 6.5 (GLOVE) ×12 IMPLANT
GLOVE BIO SURGEON STRL SZ7.5 (GLOVE) ×9 IMPLANT
GLOVE BIO SURGEONS STRL SZ 6.5 (GLOVE) ×6
GLOVE BIOGEL PI IND STRL 6.5 (GLOVE) ×2 IMPLANT
GLOVE BIOGEL PI IND STRL 7.0 (GLOVE) ×1 IMPLANT
GLOVE BIOGEL PI INDICATOR 6.5 (GLOVE) ×4
GLOVE BIOGEL PI INDICATOR 7.0 (GLOVE) ×2
GOWN STRL REUS W/ TWL LRG LVL3 (GOWN DISPOSABLE) ×4 IMPLANT
GOWN STRL REUS W/ TWL XL LVL3 (GOWN DISPOSABLE) ×2 IMPLANT
GOWN STRL REUS W/TWL LRG LVL3 (GOWN DISPOSABLE) ×8
GOWN STRL REUS W/TWL XL LVL3 (GOWN DISPOSABLE) ×4
HEMOSTAT SNOW SURGICEL 2X4 (HEMOSTASIS) ×3 IMPLANT
INSERT FOGARTY SM (MISCELLANEOUS) IMPLANT
IV ADAPTER SYR DOUBLE MALE LL (MISCELLANEOUS) IMPLANT
KIT BASIN OR (CUSTOM PROCEDURE TRAY) ×3 IMPLANT
KIT ROOM TURNOVER OR (KITS) ×3 IMPLANT
NEEDLE HYPO 25GX1X1/2 BEV (NEEDLE) IMPLANT
NEEDLE SPNL 20GX3.5 QUINCKE YW (NEEDLE) IMPLANT
PACK CAROTID (CUSTOM PROCEDURE TRAY) ×3 IMPLANT
PAD ARMBOARD 7.5X6 YLW CONV (MISCELLANEOUS) ×6 IMPLANT
PATCH VASC XENOSURE 1CMX6CM (Vascular Products) ×4 IMPLANT
PATCH VASC XENOSURE 1X6 (Vascular Products) ×2 IMPLANT
SET MICROPUNCTURE 5F STIFF (MISCELLANEOUS) ×3 IMPLANT
SHUNT CAROTID BYPASS 12 (VASCULAR PRODUCTS) ×3 IMPLANT
STOPCOCK 4 WAY LG BORE MALE ST (IV SETS) IMPLANT
SUT ETHILON 3 0 PS 1 (SUTURE) IMPLANT
SUT MNCRL AB 4-0 PS2 18 (SUTURE) ×3 IMPLANT
SUT PROLENE 5 0 C 1 24 (SUTURE) ×9 IMPLANT
SUT PROLENE 6 0 BV (SUTURE) ×6 IMPLANT
SUT PROLENE 7 0 BV 1 (SUTURE) ×3 IMPLANT
SUT SILK 2 0 PERMA HAND 18 BK (SUTURE) ×3 IMPLANT
SUT SILK 3 0 (SUTURE)
SUT SILK 3-0 18XBRD TIE 12 (SUTURE) IMPLANT
SUT VIC AB 2-0 CT1 27 (SUTURE) ×2
SUT VIC AB 2-0 CT1 TAPERPNT 27 (SUTURE) ×1 IMPLANT
SUT VIC AB 3-0 SH 27 (SUTURE) ×2
SUT VIC AB 3-0 SH 27X BRD (SUTURE) ×1 IMPLANT
SYR 20CC LL (SYRINGE) ×3 IMPLANT
SYR CONTROL 10ML LL (SYRINGE) IMPLANT
TAPE CLOTH SURG 4X10 WHT LF (GAUZE/BANDAGES/DRESSINGS) ×3 IMPLANT
TOWEL GREEN STERILE (TOWEL DISPOSABLE) ×3 IMPLANT
TUBING ART PRESS 48 MALE/FEM (TUBING) IMPLANT
TUBING EXTENTION W/L.L. (IV SETS) ×3 IMPLANT
WATER STERILE IRR 1000ML POUR (IV SOLUTION) ×3 IMPLANT

## 2017-02-26 NOTE — Progress Notes (Signed)
Patient arrived to unit with a large amount of oozing from her JP drain site. The dressing was changed and the drain was emptied. I called Dr. Corliss Skains to clarify orders due to the patient still bleeding. We are waiting for APTT results before starting the heparin. Dr. Randie Heinz came to speak with the family. Ripley Fraise D

## 2017-02-26 NOTE — Anesthesia Procedure Notes (Signed)
Procedure Name: Intubation Date/Time: 02/23/2017 7:40 AM Performed by: Freddie Breech, CRNA Pre-anesthesia Checklist: Patient identified, Emergency Drugs available, Suction available, Patient being monitored and Timeout performed Patient Re-evaluated:Patient Re-evaluated prior to induction Oxygen Delivery Method: Circle system utilized Preoxygenation: Pre-oxygenation with 100% oxygen Induction Type: IV induction Ventilation: Oral airway inserted - appropriate to patient size and Mask ventilation without difficulty Laryngoscope Size: Glidescope and 3 Grade View: Grade I Tube type: Oral Tube size: 7.0 mm Number of attempts: 2 Placement Confirmation: ETT inserted through vocal cords under direct vision,  positive ETCO2,  CO2 detector and breath sounds checked- equal and bilateral Secured at: 23 cm Tube secured with: Tape Dental Injury: Teeth and Oropharynx as per pre-operative assessment  Difficulty Due To: Difficult Airway- due to limited oral opening Comments: First attempt by SRNA with MAC 4, grade III view.  Second attempt by [facility] with glidescope successful.

## 2017-02-26 NOTE — Consult Note (Signed)
PULMONARY / CRITICAL CARE MEDICINE   Name: [name] MRN: [id number] DOB: 12-16-1940    ADMISSION DATE:  03/09/2017 CONSULTATION DATE:  03/06/2017  REFERRING MD:  Dr. Corliss Skains  CHIEF COMPLAINT:  Vent management  HISTORY OF PRESENT ILLNESS:   HPI obtained from medical chart review as patient is currently intubated and sedated.  77 year old male with past of CAD, Afib on coumadin, CKD, DM, GERD, HLD, HTN, and OSA admitted 1/14 by Dr. 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   Patient suffered intraoperative complications after carotid arteriograms and underwent endovascular repair by Dr. Corliss Skains, of occluded right ICA with dissection and pseudoaneurysm with complete revascularization.  Patient to return to ICU and remain on mechanical ventilation overnight.  PCCM consulted for further vent and medical management.   PAST MEDICAL HISTORY :  He  has a past medical history of Arthritis, Atrial fibrillation (HCC), Barrett's esophagus, Chronic kidney disease, Coronary artery disease, Diabetes (HCC), Dyspnea, GERD (gastroesophageal reflux disease), Heart murmur, History of kidney stones, Hyperlipidemia, Hypertension, Myocardial infarction (HCC), Pre-syncope, and Sleep apnea.  PAST SURGICAL HISTORY: He  has a past surgical history that includes Coronary angioplasty with stent; Coronary stent placement (07/30/2013); Shoulder surgery (Bilateral); Kidney stone surgery; Cardioversion (N/A, 12/29/2013); left heart catheterization with coronary/graft angiogram (N/A, 07/30/2013); percutaneous coronary stent intervention (pci-s) (07/30/2013); Percutaneous coronary intervention-balloon only (07/30/2013); Lithotripsy; Nephrolithotomy; Colonoscopy w/ polypectomy; Upper gi endoscopy; Total shoulder revision (Left, 12/29/2015); Joint replacement (Bilateral); Hernia repair (Left); Total hip arthroplasty (Left, 08/04/2016); LEFT HEART CATH AND  CORS/GRAFTS ANGIOGRAPHY (N/A, 02/09/2017); CAROTID ANGIOGRAPHY (Bilateral, 02/09/2017); and Coronary artery bypass graft.  Allergies  Allergen Reactions  . Ivp Dye [Iodinated Diagnostic Agents] Other (See Comments)    Increased blood pressure, heart rate "pt denies allergy " has had contrast since and no reaction. Betadine paint/scrub "OK" per patient    No current facility-administered medications on file prior to encounter.    Current Outpatient Medications on File Prior to Encounter  Medication Sig  . atorvastatin (LIPITOR) 10 MG tablet Take 10 mg by mouth at bedtime.  . carvedilol (COREG) 25 MG tablet Take 25 mg by mouth 2 (two) times daily.   Marland Kitchen diltiazem (CARDIZEM CD) 120 MG 24 hr capsule Take 1 capsule (120 mg total) by mouth 2 (two) times daily before a meal.  . glimepiride (AMARYL) 2 MG tablet Take 2 mg by mouth 2 (two) times daily before a meal.   . hydrALAZINE (APRESOLINE) 50 MG tablet Take 50 mg by mouth 3 (three) times daily.  . isosorbide mononitrate (IMDUR) 60 MG 24 hr tablet Take 1 tablet by mouth daily as needed (chest pain).   . metFORMIN (GLUCOPHAGE-XR) 500 MG 24 hr tablet Take 1 tablet (500 mg total) by mouth 2 (two) times daily with a meal.  . nitroGLYCERIN (NITROSTAT) 0.4 MG SL tablet Place 0.4 mg under the tongue every 5 (five) minutes as needed for chest pain.  Marland Kitchen olmesartan (BENICAR) 20 MG tablet Take 1 tablet by mouth daily.  . pantoprazole (PROTONIX) 40 MG tablet Take 40 mg by mouth daily as needed.   . warfarin (COUMADIN) 5 MG tablet Take 1 tablet (5 mg total) by mouth daily. (Patient taking differently: Take 5 mg by mouth daily at 6 PM. )    FAMILY HISTORY:  His indicated that his mother is deceased. He indicated that his father is deceased. He indicated that the status of his sister is unknown. He indicated that one  of his two brothers is deceased. He indicated that the status of his neg hx is unknown.   SOCIAL HISTORY: He  reports that he quit smoking about  20 years ago. He quit after 32.00 years of use. he has never used smokeless tobacco. He reports that he drinks alcohol. He reports that he does not use drugs.  REVIEW OF SYSTEMS:   Unable  SUBJECTIVE:  Presently on bairhugger, Neo at 20 mcg/min and propofol 40 mcg/kg/min RN reports 3rd dressing change to right neck and JP has been emptied x 3 since being in PACU.  VITAL SIGNS: BP (!) 129/40   Pulse (!) 51   Temp 97.7 F (36.5 C) (Oral)   Resp (!) 1   Wt 200 lb (90.7 kg)   SpO2 100%   BMI 27.89 kg/m   HEMODYNAMICS:    VENTILATOR SETTINGS: Vent Mode: PRVC FiO2 (%):  [100 %] 100 % Set Rate:  [14 bmp] 14 bmp Vt Set:  [600 mL] 600 mL PEEP:  [5 cmH20] 5 cmH20  INTAKE / OUTPUT: No intake/output data recorded.  PHYSICAL EXAMINATION: General:  Critically ill male sedated and paralyzed on MV, supine in reverse [place]  HEENT: MM pink/moist, ETT, OGT, pupils 3/ reactive, incision to right neck intact, no hematoma noted, JP with sanguineous drainage Neuro: Sedated and paralyzed  CV: SB 40's, +2 pulses PULM: even/non-labored on MV, lungs bilaterally CTA GI: obese, soft, bs hypoactive  Extremities: cool/dry, no edema  Skin: no rashes   LABS:  BMET Recent Labs  Lab 02/20/17 0858 02/20/2017 1128  NA 137 139  K 4.4 4.4  CL 104  --   CO2 23  --   BUN 28*  --   CREATININE 1.25*  --   GLUCOSE 100* 139*    Electrolytes Recent Labs  Lab 02/20/17 0858  CALCIUM 9.4    CBC Recent Labs  Lab 02/20/17 0858 03/07/2017 1128  WBC 9.4  --   HGB 10.5* 8.5*  HCT 32.7* 25.0*  PLT 235  --     Coag's Recent Labs  Lab 02/20/17 0858  APTT 28  INR 1.13    Sepsis Markers No results for input(s): LATICACIDVEN, PROCALCITON, O2SATVEN in the last 168 hours.  ABG No results for input(s): PHART, PCO2ART, PO2ART in the last 168 hours.  Liver Enzymes Recent Labs  Lab 02/20/17 0858  AST 22  ALT 17  ALKPHOS 100  BILITOT 1.0  ALBUMIN 3.7    Cardiac Enzymes No  results for input(s): TROPONINI, PROBNP in the last 168 hours.  Glucose Recent Labs  Lab 02/20/17 0808 03/03/2017 0557  GLUCAP 85 124*    Imaging Ct Head Wo Contrast  Result Date: 02/20/2017 CLINICAL DATA:  Followup vascular intervention. EXAM: CT HEAD WITHOUT CONTRAST TECHNIQUE: Contiguous axial images were obtained from the base of the skull through the vertex without intravenous contrast. COMPARISON:  Limited vascular imaging earlier today. FINDINGS: Brain: Generalized atrophy. Chronic small-vessel ischemia changes of the white matter. No sign of acute infarction, mass lesion, hemorrhage, hydrocephalus or extra-axial collection. Vascular: Some vascular contrast is present. There is atherosclerotic calcification of the major vessels at the base of the brain. Skull: Normal Sinuses/Orbits: Seasonal mucosal inflammatory changes. Orbits negative. Other: None IMPRESSION: No acute finding by CT. No sign of acute ischemic brain insult is yet visible. No hyperdense vessels beyond density associated with vascular calcification and previously administered contrast. Chronic small-vessel changes of the hemispheric white matter. Electronically Signed   By: Scherrie Bateman.D.  On: March 09, 2017 13:47   Dg C-arm 1-60 Min  Result Date: 09-Mar-2017 CLINICAL DATA:  77 year old male right-sided carotid endarterectomy EXAM: DG C-ARM 61-120 MIN COMPARISON:  None. FINDINGS: Two angiographic runs are submitted for review. The images depict a high-grade flow limiting stenosis versus dissection in the distal internal carotid artery near the skull base. IMPRESSION: Intraoperative images obtained during right carotid endarterectomy. Please see operative note for further detail. Electronically Signed   By: Malachy Moan M.D.   On: 2017/03/09 10:56   CULTURES: none  ANTIBIOTICS: Cefuroxime- 1/14- preop  SIGNIFICANT EVENTS: 1/14  Admit  LINES/TUBES: PIV x 2 1/14  R radial aline >> 1/14  ETT  >> 1/14  OGT  >> 1/14  R groin sheath >> 1/14 at 1305  DISCUSSION: 54 yoM admitted by Dr. Randie Heinz with 90% right ICA stenosis for elective right carotid endarterectomy.  Suffered intraoperative complications requiring endovascular repair by Dr. Corliss Skains, of occluded right ICA with dissection and pseudoaneurysm with complete revascularization.  Returns to ICU on vent.   ASSESSMENT / PLAN:  PULMONARY A: Respiratory insufficiency in the post-operative setting Hx OSA P:   Full MV support PRVC 8 cc/kg CXR to verify placement and ABG now VAP protocol  To remain sedated overnight, monitor for bleeding  CARDIOVASCULAR A:  Afib - previously on coumadin Hypotension- likely related to sedation Hx HTN, HLD P:  Tele monitoring Neo for SBP goals of 120-140 R/o ABLA Holding home lipitor, coreg, cardizem, imdur, olmesartan, coumadin  RENAL A:   No known acute issues  P:   Trend BMP / urinary output Replace electrolytes as indicated Avoid nephrotoxic agents, ensure adequate renal perfusion   GASTROINTESTINAL A:   NPO Hx GERD P:   NPO PPI for SUP  HEMATOLOGIC A:   On coumadin for Afib- held, pre-op INR 1.13 R/o ABLA Hx baseline anemia P:  Trend Coags CBC now  Trend CBC T&S now  Monitor neck dressings/ and JP drainage  SCDs only S/p protamine sulfate per CRNA Defer heparin gtt per Dr. Corliss Skains for now, sheath pulled at 1305-  Bairhuggar to achieve normothermia to help with coagulapathy   INFECTIOUS A:   No known acute process P:   Monitor clinically Trend WBC/ fever curve  ENDOCRINE A:   DM   P:   CBG q 4 SSI  NEUROLOGIC A:   Severe R ICA stenosis following R carotid arteriograms f/b endovascular repair of occluded R ICA dissection and pseudoaneurysm with complete revascularization - preoperatively asymptomatic 90% stenosis  P:   RASS goal: -2 PAD protocol with propofol and prn fentanyl Per Dr. Randie Heinz and Dr. Corliss Skains Hourly neuro checks Supine till 5pm- right groin sheath  pulled at 1305 Brief wakeup assessment now to assess movement  Remainder per Dr. Randie Heinz and Dr. Corliss Skains   FAMILY  - Updates: Wife and sons updated by Dr. Ardeth Perfect.    - Inter-disciplinary family meet or Palliative Care meeting due by:  03/05/2016  CCT 45 mins  Posey Boyer, AGACNP-BC  Pulmonary & Critical Care Pgr: [phone] or if no answer [phone] 2017/03/09, 3:37 PM

## 2017-02-26 NOTE — H&P (Signed)
   History and Physical Update  The patient was interviewed and re-examined.  The patient's previous History and Physical has been reviewed and is unchanged from recent office visit. Plan for right carotid endarterectomy.  Jeremias Broyhill C. Randie Heinz, MD [facility] Office: [phone] Pager: [phone]   03/10/2017, 7:13 AM

## 2017-02-26 NOTE — Op Note (Signed)
Patient name: [name] MRN: [id number] DOB: [birth year] Sex: male  03/12/2017 Pre-operative Diagnosis: asymptomatic right carotid stenosis Post-operative diagnosis:  Same Surgeon:  Apolinar Junes C. Randie Heinz, MD Assistant: Doreatha Massed, PA Procedure Performed: 1.  Right carotid endarterectomy with bovine pericardial patch angioplasty 2.  Right carotid Duplex 3.  Right carotid angiogram  Indications: 77 year old male with history of coronary artery bypass grafting as well as subsequent stenting.  He is maintained on Coumadin for atrial fibrillation.  He was recently found to have high-grade stenosis of his carotid by duplex this was confirmed with carotid angiogram demonstrated 90% stenosis.  We discussed the risk benefits alternatives of proceeding with carotid endarterectomy and he agreed to proceed.  Findings: There was an at least 90% lesion in the ICA at the level of the bifurcation.  Shunt was placed and there was adequate backbleeding and was confirmed to have flow through it during the case.  At completion of the endarterectomy and patch angioplasty there was a waterhammer signal in the ICA.  Carotid duplex demonstrated patent common carotid and internal carotid arteries with no obvious deficits or flaps.  The carotid patch angioplasty was then taken down and there was no notable defects.  A new patch was placed.  Following this we still had waterhammer outflow signal.  An on table angiogram demonstrated a dissection a few centimeters above the distal patch likely secondary to shunt injury.  With this the patient was sent to interventional radiology and remained intubated.   Procedure:  The patient was identified in the holding area and taken to the operating room where he was placed supine on the operating table and general endotracheal anesthesia was induced.  A bump was placed between his shoulders and he was sterilely prepped and draped in his right neck and upper chest in the usual  fashion after being clipped of hair.  A timeout was called.  We began with a longitudinal incision overlying the palpable bifurcation of his carotid.  We dissected through the platysma retracted the sternocleidomastoid laterally.  We first identified a very large superior thyroidal branch and this was protected.  We did have to divide some overlying veins.  The facial vein was divided and the hypoglossal nerve was identified posterior and cephalad to this.  We dissected out our common carotid artery first and placed an umbilical tape around this.  At this time the patient was heparinized and ACT returned less than 225 and additional heparin was given until the ACT was confirmed around 250.  We will placed a vessel loop around both the large superior thyroidal branch as well as the external carotid artery.  We then dissected up onto the internal carotid artery and protected our hypoglossal nerve.  Our vagus nerve had also been identified and this too was protected posterior to the carotid artery.  We did divide the ansa hypoglossi to give [place] better exposure.  A vessel loop was placed around the internal carotid artery.  Patient was then prepared by marking the midpoint with silk tie and it was flushed.  The pressure was elevated to systolic greater than 150 as his diastolic pressure would not allow a mean of 90.  With this we clamped the ICA with a Serafin clamp followed by clamping of the common carotid artery and external carotid arteries.  We opened the common onto the internal carotid arteries with an 11 blade and then extended with Pott scissors.  A shunt was placed first into the distal ICA  we confirmed backbleeding and placed it in the common carotid artery.  Doppler was used to confirm flow.  An endarterectomy was then performed with eversion of the external carotid artery.  Distally the carotid feathered very nicely.  A bovine pericardial patch was soaked and then trimmed to size and sewn in place with 6-0  Prolene suture.  Prior to completing we did remove our shunt and also allowed backbleeding from both our internal carotid and external carotid artery separately in an antegrade bleeding from our common and the bulb was flushed.  Upon completion we then unclamped our external followed by our common and after a few cardiac cycles are internal carotid artery.  We had no bleeding from our patch.  Doppler demonstrated a high resistance outflow sound in our internal carotid artery and the external had flow throughout diastole.  With this we used a duplex could not demonstrate any identifiable abnormalities within the bulb or the ICA including at the level of the most distal ICA patch.  We did give a few extra thousand units of heparin at this time.  I then elected to clamp the ICA again with a Serafin clamp and reclamped both are common and external carotid arteries.  The patch was removed in total and I extended our arteriotomy a few millimeters cephalad but cannot identify any abnormalities in the distal endpoint looked smooth.  At this time I did not place a shunt and I trimmed a new bovine pericardial patch and sewed in place with 6-0 Prolene suture.  Again all flushing maneuvers were undertaken and then we unclamped the external followed by the common and again after a few cardiac cycles the internal carotid arteries.  Unfortunately again we had a high resistance outflow sound in the internal carotid artery.  With this a micropuncture needle was used to cannulate the common carotid artery below the patch.  A micropuncture sheath was placed and angiogram performed.  This demonstrated a dissection a few centimeters cephalad to the distal endpoint of our patch consistent with dissection.  With this interventional radiology was consulted.  We obtained hemostasis in our wound to the best of our ability and then placed a 10 French flat drain.  This was sutured the skin with silk.  The drain was cut at an angle.  We then  closed the platysma with 2-0 Vicryl and the skin with 4-0 Monocryl and Dermabond was placed above that.  Patient remained intubated and was transferred to interventional radiology.  Family was updated.  All counts were correct at completion.   Dispo: interventional radiology  Contrast: 10cc  EBL: 100cc  Eddy Termine C. Randie Heinz, MD [facility] Office: [phone] Pager: [phone]

## 2017-02-26 NOTE — Transfer of Care (Signed)
Immediate Anesthesia Transfer of Care Note  Patient: [name]  Procedure(s) Performed: ENDARTERECTOMY CAROTID RIGHT (Right Neck) PATCH ANGIOPLASTY RIGHT CAROTIED ARTERY USING XENOSURE BIOLOGIC PATCH (Right Neck)  Patient Location: NICU  Anesthesia Type:General  Level of Consciousness: Patient remains intubated per anesthesia plan  Airway & Oxygen Therapy: Patient remains intubated per anesthesia plan and Patient placed on Ventilator (see vital sign flow sheet for setting)  Post-op Assessment: Report given to RN and Post -op Vital signs reviewed and stable  Post vital signs: Reviewed and stable  Last Vitals:  Vitals:   Mar 15, 2017 0554 03-15-17 0636  BP: (!) 118/38 (!) 129/40  Pulse: (!) 51   Resp: (!) 1   Temp: 36.5 C   SpO2: 97%     Last Pain:  Vitals:   2017/03/15 0554  TempSrc: Oral      Patients Stated Pain Goal: 4 (03-15-17 2536)  Complications: No apparent anesthesia complications

## 2017-02-26 NOTE — Anesthesia Procedure Notes (Signed)
Arterial Line Insertion Start/End02/13/2019 6:30 AM, 2017/03/28 6:35 AM Performed by: Sharee Holster, MD, Army Fossa, CRNA, CRNA  Patient location: Pre-op. Preanesthetic checklist: patient identified, IV checked, site marked, risks and benefits discussed, surgical consent, monitors and equipment checked, pre-op evaluation and timeout performed Lidocaine 1% used for infiltration Right, radial was placed Catheter size: 20 G Hand hygiene performed , maximum sterile barriers used  and Seldinger technique used Allen's test indicative of satisfactory collateral circulation Attempts: 1 Procedure performed without using ultrasound guided technique. Following insertion, Biopatch and dressing applied. Post procedure assessment: normal  Patient tolerated the procedure well with no immediate complications.

## 2017-02-26 NOTE — Progress Notes (Signed)
Pt to 4N ICU, pulses WNL, Groin level 0, RN assessed R groin, VPad also in place. IR team signing off

## 2017-02-26 NOTE — Progress Notes (Signed)
Dr. Randie Heinz came back to check on patient's status. He continues to bleed actively from the JP drain site and into the drain. I have emptied a total of 450 ml from his JP drain as well as changed his dressing 5 times. His groin site is also continuing to ooze despite holding pressure. CBC and APTT drawn. Will call with results and continue to monitor. [name]

## 2017-02-26 NOTE — Progress Notes (Signed)
ANTICOAGULATION CONSULT NOTE - Initial Consult  Pharmacy Consult for Heparin Indication: Dr. Corliss Skains procedure  Allergies  Allergen Reactions  . Ivp Dye [Iodinated Diagnostic Agents] Other (See Comments)    Increased blood pressure, heart rate "pt denies allergy " has had contrast since and no reaction. Betadine paint/scrub "OK" per patient    Patient Measurements: Height: 5\' 11"  (180.3 cm) Weight: 204 lb 2.3 oz (92.6 kg) IBW/kg (Calculated) : 75.3  Vital Signs: Temp: 95.1 F (35.1 C) (01/14 1430) Temp Source: Oral (01/14 0554) BP: 136/59 (01/14 1445) Pulse Rate: 47 (01/14 1445)  Labs: Recent Labs    02/18/2017 1128  HGB 8.5*  HCT 25.0*    Estimated Creatinine Clearance: 57.5 mL/min (A) (by C-G formula based on SCr of 1.25 mg/dL (H)).   Medical History: Past Medical History:  Diagnosis Date  . Arthritis   . Atrial fibrillation (HCC)    successful cardioversion 12/2013  . Barrett's esophagus   . Chronic kidney disease    CKD  . Coronary artery disease    a. h/o CABG 1997. b. stenting to graft-RCA, CB to LAD after LIMA in 2006. c. stenting to VG-RCA beyond initial stent and in-stent restenosis in 2011. c. Complex PCI 07/2013 to SVG-RCA and distal native RCA involving 4 sites (see cath report).  . Diabetes (HCC)    Type II  . Dyspnea    with exertion  . GERD (gastroesophageal reflux disease)   . Heart murmur   . History of kidney stones   . Hyperlipidemia   . Hypertension   . Myocardial infarction (HCC)   . Pre-syncope    a. 09/2012: in the setting of mild dehydration with significant straining possibly inducing a vagal event while he was dragging heavy logs.   . Sleep apnea    cpap    Assessment: Heparin post neuro procedure   Goal of Therapy:  Heparin level 0.1-0.25 units/ml Monitor platelets by anticoagulation protocol: Yes   Plan:  Heparin at 500 units / hr 8 hour heparin level Daily heparin level, CBC  Thank you [birth year]1974,  PharmD [phone]  03/10/2017,3:03 PM

## 2017-02-26 NOTE — Progress Notes (Signed)
Patient is s/p stenting and pipeline stenting of his right ICA secondary to dissection after vascular procedure today.  Patient is sedated and intubated.  He is oozing from inguinal site as well as his JP drain site in his neck.  He has had over 200cc since coming up to his ICU room.  PE: Oozing around JP drain at his neck site.  Incision is c/d/i R CFA site with some ooze despite Vpad.  A/P: Heparin is currently being held as his ptt is >200 Bedrest time restarted at 1615 secondary to ooze at inguinal site.   Will start brilinta in the morning if bleeding controlled.  Further plans per vascular Will follow.  Letha Cape 5:02 PM Mar 26, 2017

## 2017-02-26 NOTE — Progress Notes (Signed)
CRITICAL VALUE ALERT  Critical Value:  APTT >200  Date & Time Notied:  03/12/17 1630  Provider Notified: Dr. Corliss Skains   Orders Received/Actions taken: Re-draw APPT in 2 hours.

## 2017-02-26 NOTE — Procedures (Signed)
S/Pbilateral  common carotid arteriograms followed by endovascular repair of occluded RT ICA with dissection and pseudoaneurysm, using x 2 40mm x 30 mm pipeline flow diverters with complete revascularization.

## 2017-02-27 ENCOUNTER — Inpatient Hospital Stay (HOSPITAL_COMMUNITY): Payer: PPO | Admitting: Certified Registered"

## 2017-02-27 ENCOUNTER — Inpatient Hospital Stay (HOSPITAL_COMMUNITY): Payer: PPO

## 2017-02-27 ENCOUNTER — Encounter (HOSPITAL_COMMUNITY): Payer: Self-pay | Admitting: Vascular Surgery

## 2017-02-27 DIAGNOSIS — D62 Acute posthemorrhagic anemia: Secondary | ICD-10-CM

## 2017-02-27 DIAGNOSIS — I7771 Dissection of carotid artery: Secondary | ICD-10-CM

## 2017-02-27 DIAGNOSIS — G903 Multi-system degeneration of the autonomic nervous system: Secondary | ICD-10-CM

## 2017-02-27 LAB — BASIC METABOLIC PANEL
Anion gap: 10 (ref 5–15)
BUN: 31 mg/dL — ABNORMAL HIGH (ref 6–20)
CALCIUM: 8.1 mg/dL — AB (ref 8.9–10.3)
CO2: 17 mmol/L — ABNORMAL LOW (ref 22–32)
CREATININE: 1.41 mg/dL — AB (ref 0.61–1.24)
Chloride: 110 mmol/L (ref 101–111)
GFR calc non Af Amer: 47 mL/min — ABNORMAL LOW (ref >60)
GFR, EST AFRICAN AMERICAN: 54 mL/min — AB (ref >60)
Glucose, Bld: 151 mg/dL — ABNORMAL HIGH (ref 65–99)
Potassium: 4.2 mmol/L (ref 3.5–5.1)
SODIUM: 137 mmol/L (ref 135–145)

## 2017-02-27 LAB — CBC WITH DIFFERENTIAL/PLATELET
BASOS PCT: 0 %
Basophils Absolute: 0 10*3/uL (ref 0.0–0.1)
EOS ABS: 0 10*3/uL (ref 0.0–0.7)
Eosinophils Relative: 0 %
HCT: 21.9 % — ABNORMAL LOW (ref 39.0–52.0)
Hemoglobin: 7.3 g/dL — ABNORMAL LOW (ref 13.0–17.0)
Lymphocytes Relative: 12 %
Lymphs Abs: 1.2 10*3/uL (ref 0.7–4.0)
MCH: 30.8 pg (ref 26.0–34.0)
MCHC: 33.3 g/dL (ref 30.0–36.0)
MCV: 92.4 fL (ref 78.0–100.0)
MONO ABS: 0.8 10*3/uL (ref 0.1–1.0)
MONOS PCT: 8 %
Neutro Abs: 7.4 10*3/uL (ref 1.7–7.7)
Neutrophils Relative %: 80 %
PLATELETS: 222 10*3/uL (ref 150–400)
RBC: 2.37 MIL/uL — ABNORMAL LOW (ref 4.22–5.81)
RDW: 15.3 % (ref 11.5–15.5)
WBC: 9.4 10*3/uL (ref 4.0–10.5)

## 2017-02-27 LAB — GLUCOSE, CAPILLARY
GLUCOSE-CAPILLARY: 178 mg/dL — AB (ref 65–99)
GLUCOSE-CAPILLARY: 155 mg/dL — AB (ref 65–99)
GLUCOSE-CAPILLARY: 108 mg/dL — AB (ref 65–99)
Glucose-Capillary: 145 mg/dL — ABNORMAL HIGH (ref 65–99)
Glucose-Capillary: 133 mg/dL — ABNORMAL HIGH (ref 65–99)
Glucose-Capillary: 141 mg/dL — ABNORMAL HIGH (ref 65–99)

## 2017-02-27 LAB — PREPARE RBC (CROSSMATCH)

## 2017-02-27 LAB — TROPONIN I
TROPONIN I: 0.63 ng/mL — AB (ref <0.03)
Troponin I: 0.09 ng/mL (ref <0.03)

## 2017-02-27 LAB — HEPARIN LEVEL (UNFRACTIONATED): Heparin Unfractionated: 0.12 IU/mL — ABNORMAL LOW (ref 0.30–0.70)

## 2017-02-27 LAB — CK TOTAL AND CKMB (NOT AT ARMC)
CK TOTAL: 59 U/L (ref 49–397)
CK, MB: 3.2 ng/mL (ref 0.5–5.0)
Relative Index: INVALID (ref 0.0–2.5)

## 2017-02-27 LAB — PLATELET INHIBITION P2Y12: Platelet Function  P2Y12: 206 [PRU] (ref 194–418)

## 2017-02-27 MED ORDER — NITROGLYCERIN 0.4 MG SL SUBL
SUBLINGUAL_TABLET | SUBLINGUAL | Status: AC
  Administered 2017-02-27: 0.4 mg via SUBLINGUAL
  Filled 2017-02-27: qty 1

## 2017-02-27 MED ORDER — METOPROLOL TARTRATE 5 MG/5ML IV SOLN
5.0000 mg | INTRAVENOUS | Status: DC
  Administered 2017-02-27 (×2): 5 mg via INTRAVENOUS
  Filled 2017-02-27 (×2): qty 5

## 2017-02-27 MED ORDER — NITROGLYCERIN 0.4 MG SL SUBL
0.4000 mg | SUBLINGUAL_TABLET | Freq: Once | SUBLINGUAL | Status: AC
  Administered 2017-02-27 (×2): 0.4 mg via SUBLINGUAL

## 2017-02-27 MED ORDER — SODIUM CHLORIDE 0.9 % IV SOLN
Freq: Once | INTRAVENOUS | Status: AC
  Administered 2017-02-27: 08:00:00 via INTRAVENOUS

## 2017-02-27 MED ORDER — NITROGLYCERIN 0.2 MG/HR TD PT24
0.2000 mg | MEDICATED_PATCH | Freq: Every day | TRANSDERMAL | Status: DC
  Administered 2017-02-27: 0.2 mg via TRANSDERMAL
  Filled 2017-02-27: qty 1

## 2017-02-27 MED ORDER — LABETALOL HCL 5 MG/ML IV SOLN
10.0000 mg | INTRAVENOUS | Status: DC | PRN
  Administered 2017-02-27: 10 mg via INTRAVENOUS
  Filled 2017-02-27: qty 4

## 2017-02-27 MED ORDER — HYDRALAZINE HCL 20 MG/ML IJ SOLN
10.0000 mg | INTRAMUSCULAR | Status: DC | PRN
  Administered 2017-02-27: 10 mg via INTRAVENOUS
  Filled 2017-02-27: qty 1

## 2017-02-27 MED ORDER — CARVEDILOL 12.5 MG PO TABS: 12.5000 mg | ORAL_TABLET | Freq: Two times a day (BID) | ORAL | Status: DC

## 2017-02-27 MED ORDER — HYDRALAZINE HCL 25 MG PO TABS: 25.0000 mg | ORAL_TABLET | Freq: Three times a day (TID) | ORAL | Status: DC

## 2017-02-27 MED ORDER — IPRATROPIUM-ALBUTEROL 0.5-2.5 (3) MG/3ML IN SOLN: 3.0000 mL | Freq: Three times a day (TID) | RESPIRATORY_TRACT | Status: DC

## 2017-02-27 MED ORDER — ORAL CARE MOUTH RINSE
15.0000 mL | Freq: Two times a day (BID) | OROMUCOSAL | Status: DC
  Administered 2017-02-27 (×2): 15 mL via OROMUCOSAL

## 2017-02-27 MED ORDER — PANTOPRAZOLE SODIUM 40 MG PO TBEC: 40.0000 mg | DELAYED_RELEASE_TABLET | Freq: Every day | ORAL | Status: DC

## 2017-02-27 MED ORDER — ORAL CARE MOUTH RINSE
15.0000 mL | OROMUCOSAL | Status: DC
  Administered 2017-02-27 (×3): 15 mL via OROMUCOSAL

## 2017-02-27 MED ORDER — SUCCINYLCHOLINE CHLORIDE 20 MG/ML IJ SOLN
INTRAMUSCULAR | Status: AC | PRN
  Administered 2017-02-27: 100 mg via INTRAVENOUS

## 2017-02-27 MED ORDER — MORPHINE SULFATE (PF) 4 MG/ML IV SOLN
1.0000 mg | INTRAVENOUS | Status: DC | PRN
  Administered 2017-02-27 (×2): 1 mg via INTRAVENOUS
  Filled 2017-02-27: qty 1

## 2017-02-27 MED ORDER — MORPHINE SULFATE (PF) 4 MG/ML IV SOLN
INTRAVENOUS | Status: AC
  Administered 2017-02-27: 1 mg via INTRAVENOUS
  Filled 2017-02-27: qty 1

## 2017-02-27 MED ORDER — NITROGLYCERIN 0.4 MG/SPRAY TL SOLN
1.0000 | Status: DC | PRN
  Filled 2017-02-27: qty 4.9

## 2017-02-27 NOTE — Progress Notes (Signed)
Pt with little urine output for the shift 275 in total. Concerned that foley catheter was not putting out or draining properly - pt bladder scanned and resulted with .   No further interventions taken.  Francia Greaves, RN

## 2017-02-27 NOTE — Consult Note (Signed)
CARDIOLOGY CONSULT NOTE  Patient ID: [name] MRN: [id number] DOB/AGE: [birth year] 77 y.o.  Admit date: 03/09/2017 Referring Physician  Servando Snare, MD Primary Physician:  Wenda Low, MD Reason for Consultation  Chest pain  HPI: [name]  is a 77 y.o. male a history of CAD, h/o CABG in 1997, h/o PCI and DES stents to SVG to RCA in June of 2015, paroxysmal symptomatic (fatigue) atrial fibrillation S/P successful cardioversion in November 2015. He was seen by me recently and due to chest pain suggestive of angina pectoris, nuclear stress test revealing abnormal stress, he underwent cardiac catheterization and was found to have diffuse small vessel disease and recommended medical therapy.  He also had high-grade very complex asymptomatic right ICA stenosis for which was referred for carotid endarterectomy.  Underwent end arterectomy yesterday which is complicated by distal dissection and need for stenting.  This morning he is extubated without any complications except has a large right neck hematoma.  No neurologic deficits.  However after extubation, patient complained of chest pain.  EKG was obtained and serum troponin was also sent.  Chest pain is described as pressure-like sensation in the left upper part of the chest.  He is also having mild difficulty with cough due to persistent throat irritation and also neck swelling.  His wife and son present at the bedside.  Past Medical History:  Diagnosis Date  . Arthritis   . Atrial fibrillation ([place])    successful cardioversion 12/2013  . Barrett's esophagus   . Chronic kidney disease    CKD  . Coronary artery disease    a. h/o CABG 1997. b. stenting to graft-RCA, CB to LAD after LIMA in 2006. c. stenting to VG-RCA beyond initial stent and in-stent restenosis in 2011. c. Complex PCI 07/2013 to SVG-RCA and distal native RCA involving 4 sites (see cath report).  . Diabetes ([place])    Type II  . Dyspnea    with exertion  . GERD  (gastroesophageal reflux disease)   . Heart murmur   . History of kidney stones   . Hyperlipidemia   . Hypertension   . Myocardial infarction ([place])   . Pre-syncope    a. 09/2012: in the setting of mild dehydration with significant straining possibly inducing a vagal event while he was dragging heavy logs.   . Sleep apnea    cpap     Past Surgical History:  Procedure Laterality Date  . CARDIOVERSION N/A 12/29/2013   Procedure: CARDIOVERSION;  Surgeon: Larey Dresser, MD;  Location: [facility];  Service: Cardiovascular;  Laterality: N/A;  . CAROTID ANGIOGRAPHY Bilateral 02/09/2017   Procedure: CAROTID ANGIOGRAPHY;  Surgeon: Adrian Prows, MD;  Location: [facility];  Service: Cardiovascular;  Laterality: Bilateral;  . COLONOSCOPY W/ POLYPECTOMY    . CORONARY ANGIOPLASTY WITH STENT PLACEMENT    . CORONARY ARTERY BYPASS GRAFT     5 grafts  . CORONARY STENT PLACEMENT  07/30/2013   DES to RCA     DR KELLY  . ENDARTERECTOMY Right 03/09/2017   Procedure: ENDARTERECTOMY CAROTID RIGHT;  Surgeon: Waynetta Sandy, MD;  Location: [facility];  Service: Vascular;  Laterality: Right;  . HERNIA REPAIR Left    Inguinal  . JOINT REPLACEMENT Bilateral    Shoulders  . KIDNEY STONE SURGERY    . LEFT HEART CATH AND CORS/GRAFTS ANGIOGRAPHY N/A 02/09/2017   Procedure: LEFT HEART CATH AND CORS/GRAFTS ANGIOGRAPHY;  Surgeon: Adrian Prows, MD;  Location: [facility];  Service: Cardiovascular;  Laterality: N/A;  . LEFT HEART CATHETERIZATION WITH CORONARY/GRAFT ANGIOGRAM N/A 07/30/2013   Procedure: LEFT HEART CATHETERIZATION WITH Beatrix Fetters;  Surgeon: Troy Sine, MD;  Location: [facility] CATH [facility];  Service: Cardiovascular;  Laterality: N/A;  . LITHOTRIPSY    . NEPHROLITHOTOMY     kidney stone  . PATCH ANGIOPLASTY Right 03/04/2017   Procedure: PATCH ANGIOPLASTY RIGHT CAROTIED ARTERY USING XENOSURE BIOLOGIC PATCH;  Surgeon: Waynetta Sandy, MD;  Location: [facility];  Service:  Vascular;  Laterality: Right;  . PERCUTANEOUS CORONARY INTERVENTION-BALLOON ONLY  07/30/2013   Procedure: PERCUTANEOUS CORONARY INTERVENTION-BALLOON ONLY;  Surgeon: Troy Sine, MD;  Location: [facility] CATH LAB;  Service: Cardiovascular;;  . PERCUTANEOUS CORONARY STENT INTERVENTION (PCI-S)  07/30/2013   Procedure: PERCUTANEOUS CORONARY STENT INTERVENTION (PCI-S);  Surgeon: Troy Sine, MD;  Location: [facility] CATH LAB;  Service: Cardiovascular;;  . SHOULDER SURGERY Bilateral    last 11/17 left  . TOTAL HIP ARTHROPLASTY Left 08/04/2016   Procedure: LEFT TOTAL HIP ARTHROPLASTY ANTERIOR APPROACH;  Surgeon: Rod Can, MD;  Location: [facility];  Service: Orthopedics;  Laterality: Left;  . TOTAL SHOULDER REVISION Left 12/29/2015   Procedure: TOTAL SHOULDER REVISION ARHTROPLASTY;  Surgeon: Netta Cedars, MD;  Location: [facility];  Service: Orthopedics;  Laterality: Left;  . UPPER GI ENDOSCOPY       Family History  Problem Relation Age of Onset  . Heart attack Brother   . Heart disease Brother   . Diabetes Mother   . Heart disease Mother   . Hypertension Mother   . Cancer Father   . Diabetes Sister   . Hypertension Sister   . Heart disease Brother   . Diabetes Brother   . Colon cancer Neg Hx      Social History: Social History   Socioeconomic History  . Marital status: Married    Spouse name: Not on file  . Number of children: 1  . Years of education: Not on file  . Highest education level: Not on file  Social Needs  . Financial resource strain: Not on file  . Food insecurity - worry: Not on file  . Food insecurity - inability: Not on file  . Transportation needs - medical: Not on file  . Transportation needs - non-medical: Not on file  Occupational History  . Occupation: Retired  Tobacco Use  . Smoking status: Former Smoker    Years: 32.00    Last attempt to quit: 05/16/1996    Years since quitting: 20.8  . Smokeless tobacco: Never Used  Substance and Sexual Activity  . Alcohol use:  Yes    Comment: 2-3 beers a month; socially  . Drug use: No  . Sexual activity: Not on file  Other Topics Concern  . Not on file  Social History Narrative   Lives with family     Medications Prior to Admission  Medication Sig Dispense Refill Last Dose  . aspirin EC 81 MG tablet Take 81 mg by mouth daily.   03/07/2017 at 0330  . atorvastatin (LIPITOR) 10 MG tablet Take 10 mg by mouth at bedtime.   02/25/2017 at Unknown time  . carvedilol (COREG) 25 MG tablet Take 25 mg by mouth 2 (two) times daily.    03/05/2017 at 0330  . diltiazem (CARDIZEM CD) 120 MG 24 hr capsule Take 1 capsule (120 mg total) by mouth 2 (two) times daily before a meal. 60 capsule 5 03/07/2017 at 0330  . glimepiride (AMARYL) 2 MG tablet  Take 2 mg by mouth 2 (two) times daily before a meal.    02/25/2017 at Unknown time  . hydrALAZINE (APRESOLINE) 50 MG tablet Take 50 mg by mouth 3 (three) times daily.   02/25/2017 at 0330  . isosorbide mononitrate (IMDUR) 60 MG 24 hr tablet Take 1 tablet by mouth daily as needed (chest pain).   3 02/25/2017 at 0330  . metFORMIN (GLUCOPHAGE-XR) 500 MG 24 hr tablet Take 1 tablet (500 mg total) by mouth 2 (two) times daily with a meal.   02/25/2017 at Unknown time  . nitroGLYCERIN (NITROSTAT) 0.4 MG SL tablet Place 0.4 mg under the tongue every 5 (five) minutes as needed for chest pain.   Past Month at Unknown time  . olmesartan (BENICAR) 20 MG tablet Take 1 tablet by mouth daily.  4 02/25/2017 at Unknown time  . pantoprazole (PROTONIX) 40 MG tablet Take 40 mg by mouth daily as needed.    More than a month at Unknown time  . warfarin (COUMADIN) 5 MG tablet Take 1 tablet (5 mg total) by mouth daily. (Patient taking differently: Take 5 mg by mouth daily at 6 PM. ) 90 tablet 3 02/21/2017   Scheduled Meds: . aspirin EC  81 mg Oral Q breakfast  . chlorhexidine gluconate (MEDLINE KIT)  15 mL Mouth Rinse BID  . insulin aspart  1-3 Units Subcutaneous Q4H  . ipratropium-albuterol  3 mL Nebulization Q6H  .  mouth rinse  15 mL Mouth Rinse q12n4p  . nitroGLYCERIN  0.2 mg Transdermal Daily  . pantoprazole (PROTONIX) IV  40 mg Intravenous Daily  . ticagrelor  90 mg Oral BID   Continuous Infusions: . sodium chloride 75 mL/hr at 03/05/2017 0918  . sodium chloride    . niCARDipine    . phenylephrine (NEO-SYNEPHRINE) Adult infusion Stopped (03/06/2017 0915)   PRN Meds:.acetaminophen **OR** acetaminophen (TYLENOL) oral liquid 160 mg/5 mL **OR** acetaminophen, bisacodyl, docusate, hydrALAZINE, labetalol, morphine injection, nitroGLYCERIN, ondansetron (ZOFRAN) IV   Review of Systems -neck pain is present.  Denies any visual disturbances, headache, nausea or vomiting.  Denies any bowel or bladder disturbances.  No hemoptysis.  Denies dyspnea, palpitations. Positive for chest pain and neck pain.  Also positive for cough.    Physical Exam: Blood pressure (!) 168/83, pulse 86, temperature (!) 97.5 F (36.4 C), temperature source Axillary, resp. rate 15, height '5\' 11"'  (1.803 m), weight 92.6 kg (204 lb 2.3 oz), SpO2 100 %.   General appearance: alert, cooperative, appears stated age, no distress and moderately obese Neck: Right carotid endarterectomy scar noted.  Large hematoma noted Lungs: clear to auscultation bilaterally Chest wall: no tenderness Heart: regular rate and rhythm, S1, S2 normal, no murmur, click, rub or gallop Abdomen: soft, non-tender; bowel sounds normal; no masses,  no organomegaly and Obese. Extremities: extremities normal, atraumatic, no cyanosis or edema Pulses: 2+ and symmetric Neurologic: Grossly normal  Labs:  Cardiac Panel (last 3 results) Recent Labs    03/10/2017 1112  CKTOTAL 59  CKMB 3.2  TROPONINI 0.09*  RELINDX RELATIVE INDEX IS INVALID      Lab Results  Component Value Date   WBC 9.4 03/04/2017   HGB 7.3 (L) 02/13/2017   HCT 21.9 (L) 03/09/2017   MCV 92.4 03/02/2017   PLT 222 03/09/2017    Recent Labs  Lab 02/25/2017 0449  NA 137  K 4.2  CL 110  CO2  17*  BUN 31*  CREATININE 1.41*  CALCIUM 8.1*  GLUCOSE 151*  Lipid Panel     Component Value Date/Time   CHOL 225 (H) 12/09/2013 0927   TRIG 55 03/04/2017 1756   HDL 52 12/09/2013 0927   CHOLHDL 4.3 12/09/2013 0927   VLDL 32 12/09/2013 0927   LDLCALC 141 (H) 12/09/2013 0927    HEMOGLOBIN A1C Lab Results  Component Value Date   HGBA1C 6.0 (H) 02/20/2017   MPG 125.5 02/20/2017  Radiology: Ct Head Wo Contrast  Result Date: 02/17/2017 CLINICAL DATA:  Followup vascular intervention. EXAM: CT HEAD WITHOUT CONTRAST TECHNIQUE: Contiguous axial images were obtained from the base of the skull through the vertex without intravenous contrast. COMPARISON:  Limited vascular imaging earlier today. FINDINGS: Brain: Generalized atrophy. Chronic small-vessel ischemia changes of the white matter. No sign of acute infarction, mass lesion, hemorrhage, hydrocephalus or extra-axial collection. Vascular: Some vascular contrast is present. There is atherosclerotic calcification of the major vessels at the base of the brain. Skull: Normal Sinuses/Orbits: Seasonal mucosal inflammatory changes. Orbits negative. Other: None IMPRESSION: No acute finding by CT. No sign of acute ischemic brain insult is yet visible. No hyperdense vessels beyond density associated with vascular calcification and previously administered contrast. Chronic small-vessel changes of the hemispheric white matter. Electronically Signed   By: Nelson Chimes M.D.   On: 03/09/2017 13:47   Portable Chest Xray  Result Date: 03/08/2017 CLINICAL DATA:  Endotracheal tube. EXAM: PORTABLE CHEST 1 VIEW COMPARISON:  Radiograph of February 26, 2017. FINDINGS: Endotracheal tube has been partially withdrawn, with distal tip 8 cm above the carina. Nasogastric tube is unchanged in position. Status post coronary artery bypass graft. No pneumothorax is noted. Mild bibasilar subsegmental atelectasis is noted. Status post bilateral shoulder arthroplasties.  IMPRESSION: Endotracheal tube has been partially withdrawn, but remains in good position. Stable bibasilar subsegmental atelectasis is noted. Electronically Signed   By: Marijo Conception, M.D.   On: 03/11/2017 07:52   Dg Chest Port 1 View  Result Date: 03/15/2017 CLINICAL DATA:  Right carotid occlusion and status post intubation. EXAM: PORTABLE CHEST 1 VIEW COMPARISON:  11/08/2013 FINDINGS: Endotracheal tube present with the tip approximately 3.5 cm above the carina. Gastric decompression tube extends below the diaphragm. The heart size is normal status post prior CABG. There is mild atelectasis in the right lower lung. There is no evidence of pulmonary edema, consolidation, pneumothorax, nodule or pleural fluid. Bilateral humeral hemiarthroplasties present. IMPRESSION: Endotracheal tube tip approximately 3.5 cm above the carina. Mild atelectasis present in the right lower lung. Electronically Signed   By: Aletta Edouard M.D.   On: 02/24/2017 15:49   Dg Abd Portable 1v  Result Date: 02/16/2017 CLINICAL DATA:  Orogastric tube placement. EXAM: PORTABLE ABDOMEN - 1 VIEW COMPARISON:  No recent prior. FINDINGS: Orogastric tube noted with tip at the gastroesophageal junction. Advancement of approximately 12 cm should be considered. No bowel distention. IMPRESSION: Orogastric tube noted with tip at the gastroesophageal junction. Advancement of approximately 12 cm should be considered. Para these results will be called to the ordering clinician or representative by the Radiologist Assistant, and communication documented in the PACS or zVision Dashboard. Electronically Signed   By: Marcello Moores  Register   On: 02/21/2017 07:52   Dg C-arm 1-60 Min  Result Date: 02/23/2017 CLINICAL DATA:  77 year old male right-sided carotid endarterectomy EXAM: DG C-ARM 61-120 MIN COMPARISON:  None. FINDINGS: Two angiographic runs are submitted for review. The images depict a high-grade flow limiting stenosis versus dissection in the  distal internal carotid artery near the skull base. IMPRESSION: Intraoperative  images obtained during right carotid endarterectomy. Please see operative note for further detail. Electronically Signed   By: Jacqulynn Cadet M.D.   On: 03/01/2017 10:56    Scheduled Meds: . aspirin EC  81 mg Oral Q breakfast  . chlorhexidine gluconate (MEDLINE KIT)  15 mL Mouth Rinse BID  . insulin aspart  1-3 Units Subcutaneous Q4H  . ipratropium-albuterol  3 mL Nebulization Q6H  . mouth rinse  15 mL Mouth Rinse q12n4p  . nitroGLYCERIN  0.2 mg Transdermal Daily  . pantoprazole (PROTONIX) IV  40 mg Intravenous Daily  . ticagrelor  90 mg Oral BID   Continuous Infusions: . sodium chloride 75 mL/hr at 02/24/2017 0918  . sodium chloride    . niCARDipine    . phenylephrine (NEO-SYNEPHRINE) Adult infusion Stopped (03/10/2017 0915)   PRN Meds:.acetaminophen **OR** acetaminophen (TYLENOL) oral liquid 160 mg/5 mL **OR** acetaminophen, bisacodyl, docusate, hydrALAZINE, labetalol, morphine injection, nitroGLYCERIN, ondansetron (ZOFRAN) IV  CARDIAC STUDIES:  EKG 02/23/2017: Normal sinus rhythm with rate of 82 bpm, I RBBB LVH with repolarization abnormality, cannot exclude lateral ischemia.  Compared to the EKG done on 02/09/2017, inferior T wave inversion old, lateral T wave inversion new   Left heart catheterization 02/09/2017: Normal LVEDP.  LV angiography not performed, has normal LVEF by echocardiogram.  Left main: Severely diffusely diseased.  No focal high-grade stenosis.  Small caliber due to the diffuse disease.  Circumflex coronary artery: Ostial 60-70 % stenosis.  Circumflex is a small to moderate caliber vessel with diffuse disease.  Marginals are not visualized and appear to be diffusely diseased. SVG to OM occluded (Chronic) LAD: Small caliber vessel Disease.  Occluded in the midsegment.  Distally supplied by LIMA.  LAD gives origin to a large distribution diagonal-1 which is diffusely diseased and is  about a 2 mm vessel with a proximal 70-80% stenosis.  There is a moderate to large distribution D2 which again has mild diffuse disease, small caliber. LIMA to LAD: Widely patent. RCA: Occluded in the proximal segment.  Distally supplied by saphenous vein graft.  SVG to RCA is widely patent with patent stents placed in June 2015.  The native RCA at the bifurcation of PDA and PL branch has high-grade calcific 90% stenosis.  Trifurcation stenosis involving the distal right and PDA and PL branch.  ASSESSMENT AND PLAN:  1.  Angina pectoris due to demand ischemia from postop blood loss 2.  Coronary artery disease of the native vessel and bypass graft, please see cardiac catheterization report above. 3.  Asymptomatic right high-grade complex ICA stenosis, S/P right carotid endarterectomy, complicated by distal dissection and need for emergent stenting.  Patient without any neurologic deficits. 4.  Paroxysmal atrial fibrillation, maintain sinus rhythm. CHA2DS2-VASc Score is 5 with yearly risk of stroke of 6.7%.  5.  Hypertension 6.  Mixed hyperlipidemia 7.  Diabetes mellitus type 2 controlled without hypoglycemia  Recommendation: I will start the patient on nitroglycerin paste, will restart his beta-blockers this is a benign ischemia.  He has small vessel disease and do not suspect that he will need any cardiac intervention.  With regard to carotid artery stenting, discussed with Dr. Estanislado Pandy, he can be continued on aspirin and Brilinta for now and can hold off on Coumadin.  We will also start the patient on sliding scale insulin for now until he is able to tolerate oral feed.  Adrian Prows, MD 02/14/2017, 12:59 PM [facility] [place] Pager: [phone] Office: [phone] If no answer Cell [phone]

## 2017-02-27 NOTE — Code Documentation (Signed)
CODE BLUE NOTE  Patient Name: [name]   MRN: [id number]   Date of Birth/ Sex: 01-24-1941 , male      Admission Date: 2017/03/10  Attending Provider: Julieanne Cotton, MD  Primary Diagnosis: <principal problem not specified>    Indication:  Pt was in his usual state of health until this PM, when he was noted to be in V fib. Code blue was subsequently called. At the time of arrival on scene, ACLS protocol was underway.    Technical Description:  - CPR performance duration:  24 minutes  - Was defibrillation or cardioversion used? No   - Was external pacer placed? No  - Was patient intubated pre/post CPR? No    Medications Administered: Y = Yes; Blank = No Amiodarone    Atropine    Calcium    Epinephrine  Y  Lidocaine    Magnesium    Norepinephrine    Phenylephrine    Sodium bicarbonate  Y  Vasopressin      Post CPR evaluation:  - Final Status - Was patient successfully resuscitated ? no - What is current rhythm? PEA - What is current hemodynamic status? deceased   Miscellaneous Information:  - Labs sent, including: none  - Primary team notified?  Yes  - Family Notified? Yes  - Additional notes/ transfer status: Patient is deceased     Of note he was having angina chest pain and was evaluated by cardiology earlier on 03/11/22. It was felt that his chest pain was due to blood loss from an ICA dissection. He was on no anticoagulation and was on asa and brillinta. It is felt that his increased demand due to blood loss likely created the circumstances for his arrhythmia. After compressions he was in PEA. His rhythm never changed during the course of his code. Time of death was called at 10-Jun-2232 on Mar 11, 2022.  Myrene Buddy, MD  11-Mar-2017, 10:37 PM

## 2017-02-27 NOTE — Progress Notes (Addendum)
ANTICOAGULATION CONSULT NOTE  Pharmacy Consult for Heparin Indication: Dr. Corliss Skains procedure  Allergies  Allergen Reactions  . Ivp Dye [Iodinated Diagnostic Agents] Other (See Comments)    Increased blood pressure, heart rate "pt denies allergy " has had contrast since and no reaction. Betadine paint/scrub "OK" per patient    Patient Measurements: Height: 5\' 11"  (180.3 cm) Weight: 204 lb 2.3 oz (92.6 kg) IBW/kg (Calculated) : 75.3  Vital Signs: Temp: 97.3 F (36.3 C) (01/14 1600) Temp Source: Axillary (01/14 1600) BP: 128/57 (01/15 0215) Pulse Rate: 62 (01/15 0215)  Labs: Recent Labs    03/02/2017 1128 03/15/2017 1414 03/08/2017 1756 March 22, 2017 0116  HGB 8.5* 8.8* 7.8*  --   HCT 25.0* 26.4* 24.0*  --   PLT  --  218 226  --   APTT  --  >200* 117*  --   HEPARINUNFRC  --   --   --  0.12*    Estimated Creatinine Clearance: 57.5 mL/min (A) (by C-G formula based on SCr of 1.25 mg/dL (H)).   Medical History: Past Medical History:  Diagnosis Date  . Arthritis   . Atrial fibrillation ([facility])    successful cardioversion 12/2013  . Barrett's esophagus   . Chronic kidney disease    CKD  . Coronary artery disease    a. h/o CABG 1997. b. stenting to graft-RCA, CB to LAD after LIMA in 2006. c. stenting to VG-RCA beyond initial stent and in-stent restenosis in 2011. c. Complex PCI 07/2013 to SVG-RCA and distal native RCA involving 4 sites (see cath report).  . Diabetes ([facility])    Type II  . Dyspnea    with exertion  . GERD (gastroesophageal reflux disease)   . Heart murmur   . History of kidney stones   . Hyperlipidemia   . Hypertension   . Myocardial infarction ([facility])   . Pre-syncope    a. 09/2012: in the setting of mild dehydration with significant straining possibly inducing a vagal event while he was dragging heavy logs.   . Sleep apnea    cpap    Assessment: Heparin s/p CCA arteriogram with intervention due of R ICA.  Initial heparin is therapeutic.  Goal of Therapy:   Heparin level 0.1-0.25 units/ml Monitor platelets by anticoagulation protocol: Yes   Plan:  Continue heparin at 700 units/hr Turn off at 0700  04-05-1975  03/22/2017 3:02 AM

## 2017-02-27 NOTE — Progress Notes (Signed)
CRITICAL VALUE ALERT  Critical Value:  Troponin 0.09   Date & Time Notied:  2017-03-12 @ 1259  Provider Notified: Dr. Jacinto Halim

## 2017-02-27 NOTE — Progress Notes (Signed)
  Progress Note    02/25/2017 7:05 AM 1 Day Post-Op  Subjective:  Remains intubated, sedated  Vitals:   03/06/2017 0325 02/18/2017 0330  BP: (!) 150/42 (!) 137/58  Pulse: 62 63  Resp: 14 14  Temp:    SpO2: 100% 100%    Physical Exam: Intubated, sedated Right neck is soft, drain in place with minimal oozing around the drain  CBC    Component Value Date/Time   WBC 9.4 02/22/2017 0449   RBC 2.37 (L) 03/10/2017 0449   HGB 7.3 (L) 03/07/2017 0449   HCT 21.9 (L) 03/13/2017 0449   PLT 222 03/09/2017 0449   MCV 92.4 03/03/2017 0449   MCV 98.3 (A) 01/28/2013 0943   MCH 30.8 03/10/2017 0449   MCHC 33.3 02/23/2017 0449   RDW 15.3 02/13/2017 0449   LYMPHSABS 1.2 02/20/2017 0449   MONOABS 0.8 02/18/2017 0449   EOSABS 0.0 02/19/2017 0449   BASOSABS 0.0 02/19/2017 0449    BMET    Component Value Date/Time   NA 137 03/05/2017 0449   K 4.2 02/14/2017 0449   CL 110 03/02/2017 0449   CO2 17 (L) 03/02/2017 0449   GLUCOSE 151 (H) 02/13/2017 0449   BUN 31 (H) 02/24/2017 0449   CREATININE 1.41 (H) 02/22/2017 0449   CREATININE 1.35 12/23/2013 0837   CALCIUM 8.1 (L) 03/15/2017 0449   GFRNONAA 47 (L) 02/24/2017 0449   GFRAA 54 (L) 03/08/2017 0449    INR    Component Value Date/Time   INR 1.13 02/20/2017 0858     Intake/Output Summary (Last 24 hours) at 03/12/2017 0705 Last data filed at 03/06/2017 0500 Gross per 24 hour  Intake 3959.75 ml  Output 1875 ml  Net 2084.75 ml     Assessment:  77 y.o. male is s/p R CEA with subsequent stenting. Followed commands last pm. Acute blood loss anemia appears to have slowed  Plan: Transfuse 2 units prbc's Ok for extubation from surgical standpoint as neck appears soft and drainage minimal overnight Brilinta, asa, heparin drip  [name] C. Randie Heinz, MD [facility] Office: [phone] Pager: [phone]  02/23/2017 7:05 AM

## 2017-02-27 NOTE — Progress Notes (Signed)
[facility] Pulmonary Diseases & Critical Care Medicine Progress Note  Patient Name: [name] MRN: [id number] DOB: [birth year]    ADMISSION DATE:  03/07/2017 CONSULTATION DATE:  02/17/2017  REFERRING MD:  Dr. Luanne Bras  REASON FOR CONSULTATION:  Ventilator management, postoperative care   ASSESSMENT/PLAN:  ASSESSMENT (included in the Hospital Problem List)  Active Problems:   Internal carotid artery dissection (HCC)   Hypotension   Endotracheally intubated   Acute blood loss anemia   PLAN/RECOMMENDATIONS   sublingual nitroglycerin PRN, morphine 1 mg IV q 2 hr PRN for chest pain and postoperative pain  12-lead EKG. Check cardiac enzymes  Labetalol 10 mg IV PRN and hydralazine 10 mg IV PRN for SBP > 140   Speech/swallow evaluation. If he is able to take pills, will need to restart his home antihypertensive regimen (Coreg 25 mg PO BID, Cardizem CD 120 mg PO BID, hydralazine 50 mg PO TID, Imdur 60 mg PO daily).  Titrate FiO2 to keep SpO2 93+%  Continue aspirin/Brilinta  Sliding scale insulin for diabetes control   NUTRITION: NPO for now    DVT PROPHYLAXIS: heparin gtt obviates need for additional chemoprophylaxis. Takes warfarin at home.  GI PROPHYLAXIS: Protonix    SUBJECTIVE:   -Interim events: extubated this am. bleeding has subsided. JP drain has slowed, perhaps with a hematoma that has tamponaded bleeding from operative site. Remains intubated. Awake. Wants ETT out. Off vasopressors. Reported chest pain after extubation. No hypoxia. BP and HR WNL.  HISTORY OF PRESENT ILLNESS This is a 77 year old Caucasian male who has been transferred to ICU (4N) after undergoing bilateral common carotid arteriograms followed by endovascular repair of occluded RT ICA with dissection and pseudoaneurysm. JP drain in the R neck has considerable drainage. Hypotensive, on phenylephrine gtt. Remains intubated. Patient's wife and son are understandably concerned  about the complicated clinical course today. Listened to their concerns. Dr. Donzetta Matters also sat down and updated family. Will keep intubated until bleeding is sufficiently controlled. Protamine was administered upon arrival in the ICU. Heparin gtt is on hold.  REVIEW OF SYSTEMS: unable to obtain due to intubated state  PAST MEDICAL/SURGICAL/SOCIAL/FAMILY HISTORY  Past Medical History:  Diagnosis Date  . Arthritis   . Atrial fibrillation ([place])    successful cardioversion 12/2013  . Barrett's esophagus   . Chronic kidney disease    CKD  . Coronary artery disease    a. h/o CABG 1997. b. stenting to graft-RCA, CB to LAD after LIMA in 2006. c. stenting to VG-RCA beyond initial stent and in-stent restenosis in 2011. c. Complex PCI 07/2013 to SVG-RCA and distal native RCA involving 4 sites (see cath report).  . Diabetes ([place])    Type II  . Dyspnea    with exertion  . GERD (gastroesophageal reflux disease)   . Heart murmur   . History of kidney stones   . Hyperlipidemia   . Hypertension   . Myocardial infarction ([place])   . Pre-syncope    a. 09/2012: in the setting of mild dehydration with significant straining possibly inducing a vagal event while he was dragging heavy logs.   . Sleep apnea    cpap    Past Surgical History:  Procedure Laterality Date  . CARDIOVERSION N/A 12/29/2013   Procedure: CARDIOVERSION;  Surgeon: Larey Dresser, MD;  Location: [place];  Service: Cardiovascular;  Laterality: N/A;  . CAROTID ANGIOGRAPHY Bilateral 02/09/2017   Procedure: CAROTID ANGIOGRAPHY;  Surgeon: Adrian Prows, MD;  Location: [place]  [facility];  Service: Cardiovascular;  Laterality: Bilateral;  . COLONOSCOPY W/ POLYPECTOMY    . CORONARY ANGIOPLASTY WITH STENT PLACEMENT    . CORONARY ARTERY BYPASS GRAFT     5 grafts  . CORONARY STENT PLACEMENT  07/30/2013   DES to RCA     DR KELLY  . HERNIA REPAIR Left    Inguinal  . JOINT REPLACEMENT Bilateral    Shoulders  . KIDNEY STONE SURGERY    . LEFT  HEART CATH AND CORS/GRAFTS ANGIOGRAPHY N/A 02/09/2017   Procedure: LEFT HEART CATH AND CORS/GRAFTS ANGIOGRAPHY;  Surgeon: Adrian Prows, MD;  Location: [place] [facility];  Service: Cardiovascular;  Laterality: N/A;  . LEFT HEART CATHETERIZATION WITH CORONARY/GRAFT ANGIOGRAM N/A 07/30/2013   Procedure: LEFT HEART CATHETERIZATION WITH Beatrix Fetters;  Surgeon: Troy Sine, MD;  Location: [facility] CATH LAB;  Service: Cardiovascular;  Laterality: N/A;  . LITHOTRIPSY    . NEPHROLITHOTOMY     kidney stone  . PERCUTANEOUS CORONARY INTERVENTION-BALLOON ONLY  07/30/2013   Procedure: PERCUTANEOUS CORONARY INTERVENTION-BALLOON ONLY;  Surgeon: Troy Sine, MD;  Location[facility] CATH LAB;  Service: Cardiovascular;;  . PERCUTANEOUS CORONARY STENT INTERVENTION (PCI-S)  07/30/2013   Procedure: PERCUTANEOUS CORONARY STENT INTERVENTION (PCI-S);  Surgeon: Troy Sine, MD;  Location: [facility] CATH LAB;  Service: Cardiovascular;;  . SHOULDER SURGERY Bilateral    last 11/17 left  . TOTAL HIP ARTHROPLASTY Left 08/04/2016   Procedure: LEFT TOTAL HIP ARTHROPLASTY ANTERIOR APPROACH;  Surgeon: Rod Can, MD;  Location: [place];  Service: Orthopedics;  Laterality: Left;  . TOTAL SHOULDER REVISION Left 12/29/2015   Procedure: TOTAL SHOULDER REVISION ARHTROPLASTY;  Surgeon: Netta Cedars, MD;  Location: [place];  Service: Orthopedics;  Laterality: Left;  . UPPER GI ENDOSCOPY      Social History   Tobacco Use  . Smoking status: Former Smoker    Years: 32.00    Last attempt to quit: 05/16/1996    Years since quitting: 20.8  . Smokeless tobacco: Never Used  Substance Use Topics  . Alcohol use: Yes    Comment: 2-3 beers a month; socially    Family History  Problem Relation Age of Onset  . Heart attack Brother   . Heart disease Brother   . Diabetes Mother   . Heart disease Mother   . Hypertension Mother   . Cancer Father   . Diabetes Sister   . Hypertension Sister   . Heart disease Brother   . Diabetes Brother    . Colon cancer Neg Hx     Prior to Admission medications   Medication Sig Start Date End Date Taking? Authorizing Provider  aspirin EC 81 MG tablet Take 81 mg by mouth daily.   Yes [provider]  atorvastatin (LIPITOR) 10 MG tablet Take 10 mg by mouth at bedtime.   Yes [provider]  carvedilol (COREG) 25 MG tablet Take 25 mg by mouth 2 (two) times daily.  10/29/13  Yes Lyda Jester M, PA-C  diltiazem (CARDIZEM CD) 120 MG 24 hr capsule Take 1 capsule (120 mg total) by mouth 2 (two) times daily before a meal. 11/10/13  Yes Barrett, Rhonda G, PA-C  glimepiride (AMARYL) 2 MG tablet Take 2 mg by mouth 2 (two) times daily before a meal.    Yes [provider]  hydrALAZINE (APRESOLINE) 50 MG tablet Take 50 mg by mouth 3 (three) times daily.   Yes [provider]  isosorbide mononitrate (IMDUR) 60 MG 24 hr tablet  Take 1 tablet by mouth daily as needed (chest pain).  01/24/17  Yes [provider]  metFORMIN (GLUCOPHAGE-XR) 500 MG 24 hr tablet Take 1 tablet (500 mg total) by mouth 2 (two) times daily with a meal. 02/11/17  Yes Adrian Prows, MD  nitroGLYCERIN (NITROSTAT) 0.4 MG SL tablet Place 0.4 mg under the tongue every 5 (five) minutes as needed for chest pain.   Yes [provider]  olmesartan (BENICAR) 20 MG tablet Take 1 tablet by mouth daily. 01/24/17  Yes [provider]  pantoprazole (PROTONIX) 40 MG tablet Take 40 mg by mouth daily as needed.     [provider]  warfarin (COUMADIN) 5 MG tablet Take 1 tablet (5 mg total) by mouth daily. Patient taking differently: Take 5 mg by mouth daily at 6 PM.  12/05/13   Troy Sine, MD    Allergies  Allergen Reactions  . Ivp Dye [Iodinated Diagnostic Agents] Other (See Comments)    Increased blood pressure, heart rate "pt denies allergy " has had contrast since and no reaction. Betadine paint/scrub "OK" per patient    Current Facility-Administered Medications:  .   0.9 %  sodium chloride infusion, , Intravenous, Continuous, Deveshwar, Sanjeev, MD, Last Rate: 75 mL/hr at 02/25/2017 0918 .  0.9 %  sodium chloride infusion, , Intravenous, Once, Early, Arvilla Meres, MD .  acetaminophen (TYLENOL) tablet 650 mg, 650 mg, Oral, Q4H PRN **OR** acetaminophen (TYLENOL) solution 650 mg, 650 mg, Per Tube, Q4H PRN **OR** acetaminophen (TYLENOL) suppository 650 mg, 650 mg, Rectal, Q4H PRN, Deveshwar, Sanjeev, MD .  aspirin EC tablet 81 mg, 81 mg, Oral, Q breakfast, Deveshwar, Sanjeev, MD, 81 mg at 03/02/2017 0833 .  bisacodyl (DULCOLAX) suppository 10 mg, 10 mg, Rectal, Daily PRN, Jennelle Human B, NP .  chlorhexidine gluconate (MEDLINE KIT) (PERIDEX) 0.12 % solution 15 mL, 15 mL, Mouth Rinse, BID, Deveshwar, Sanjeev, MD, 15 mL at 02/23/2017 0832 .  docusate (COLACE) 50 MG/5ML liquid 100 mg, 100 mg, Per Tube, BID PRN, Jennelle Human B, NP .  insulin aspart (novoLOG) injection 1-3 Units, 1-3 Units, Subcutaneous, Q4H, Jennelle Human B, NP, 1 Units at 03/12/2017 [phone] .  ipratropium-albuterol (DUONEB) 0.5-2.5 (3) MG/3ML nebulizer solution 3 mL, 3 mL, Nebulization, Q6H, Renee Pain, MD, 3 mL at 03/10/2017 0814 .  MEDLINE mouth rinse, 15 mL, Mouth Rinse, Q2H, Deveshwar, Sanjeev, MD, 15 mL at 02/24/2017 1033 .  nicardipine (CARDENE) 47m in 0.86% saline 2022mIV infusion (0.1 mg/ml), 0-15 mg/hr, Intravenous, Continuous, Deveshwar, Sanjeev, MD .  nitroGLYCERIN (NITROLINGUAL) 0.4 MG/SPRAY spray 1 spray, 1 spray, Sublingual, Q5 Min x 3 PRN, JeRenee PainMD .  nitroGLYCERIN (NITROSTAT) 0.4 MG SL tablet, , , ,  .  ondansetron (ZOFRAN) injection 4 mg, 4 mg, Intravenous, Q6H PRN, Deveshwar, Sanjeev, MD .  pantoprazole (PROTONIX) injection 40 mg, 40 mg, Intravenous, Daily, SiJennelle Human, NP, 40 mg at 03/09/2017 1033 .  phenylephrine (NEO-SYNEPHRINE) 10 mg in sodium chloride 0.9 % 250 mL (0.04 mg/mL) infusion, 0-400 mcg/min, Intravenous, Titrated, JeRenee PainMD, Stopped at 03/03/2017 0915 .   ticagrelor (BRILINTA) tablet 90 mg, 90 mg, Oral, BID, Deveshwar, Sanjeev, MD, 90 mg at 02/16/2017 0918   OBJECTIVE:   VITAL SIGNS: BP (!) 148/65   Pulse 82   Temp 98.5 F (36.9 C) (Axillary)   Resp 12   Ht _0  (1.803 m)   Wt 92.6 kg (204 lb 2.3 oz)   SpO2 100%   BMI 28.47 kg/m  Vitals:   03/11/2017 0844 02/15/2017 0900 03/06/2017 1021 02/25/2017 1030  BP: (!) 132/57 140/62 (!) 173/75 (!) 148/65  Pulse: 67 69  82  Resp: _0 Temp: (!) 97.5 F (36.4 C) 98.5 F (36.9 C)  98.5 F (36.9 C)  TempSrc: Axillary Axillary  Axillary  SpO2: 100% 100% 100% 100%  Weight:      Height:        HEMODYNAMICS:    VENTILATOR SETTINGS: Vent Mode: PSV;CPAP FiO2 (%):  [40 %-100 %] 40 % Set Rate:  [14 bmp] 14 bmp Vt Set:  [600 mL] 600 mL PEEP:  [5 cmH20] 5 cmH20 Pressure Support:  [8 cmH20] 8 cmH20 Plateau Pressure:  [11 cmH20-15 cmH20] 11 cmH20  INTAKE / OUTPUT: I/O last 3 completed shifts: In: 4654.7 [I.V.:4654.7] Out: 1875 [Urine:775; Drains:700; Blood:400]  PHYSICAL EXAMINATION: General:  Intubated and sedated.  Head: normocephalic, atraumatic EYE: PERRLA, EOM intact, no scleral icterus, no pallor Nose: nares are patent.  Throat/Oral Cavity: ETT in situ. Neck: R neck fullnes. JP drain output > 450 mL overnight. Trachea midline. Chest/Lung: symmetric in development and expansion. Good air entry. No use of accessory muscles. No crackles. No wheezes. No dullness to percussion. Heart: Regular S1 and S2 without murmur, rub or gallop. Sinus rhythm on monitor. Abdomen: soft, nontender, nondistended. Normoactive bowel sounds. n rebound. No guarding. No hepatosplenomegaly. Extremities: no pedal edema, no cyanosis, no clubbing. 2+ DP pulses Lymphatic: no cervical/axiallary/inguinal lymph nodes appreciated Skin:  Warm. Dry. No rash or lesion.   LABS:  BMET Recent Labs  Lab 03/10/2017 1128 02/24/2017 0449  NA 139 137  K 4.4 4.2  CL  --  110  CO2  --  17*  BUN  --  31*  CREATININE   --  1.41*  GLUCOSE 139* 151*    Electrolytes Recent Labs  Lab 03/05/2017 0449  CALCIUM 8.1*    CBC Recent Labs  Lab 03/14/2017 1414 02/16/2017 1756 03/11/2017 0449  WBC 10.1 7.2 9.4  HGB 8.8* 7.8* 7.3*  HCT 26.4* 24.0* 21.9*  PLT 218 226 222    Coag's Recent Labs  Lab 02/20/2017 1414 03/14/2017 1756  APTT >200* 117*    Sepsis Markers No results for input(s): LATICACIDVEN, PROCALCITON, O2SATVEN in the last 168 hours.  ABG Recent Labs  Lab 02/25/2017 1442  PHART 7.408  PCO2ART 36.0  PO2ART 392.0*    Liver Enzymes No results for input(s): AST, ALT, ALKPHOS, BILITOT, ALBUMIN in the last 168 hours.  Cardiac Enzymes No results for input(s): TROPONINI, PROBNP in the last 168 hours.  Glucose Recent Labs  Lab 02/24/2017 0557 02/24/2017 1640 02/15/2017 2040 02/20/2017 2253 03/06/2017 0403 03/08/2017 0736  GLUCAP 124* 173* 172* 178* 155* 145*    Imaging Ct Head Wo Contrast  Result Date: 02/21/2017 CLINICAL DATA:  Followup vascular intervention. EXAM: CT HEAD WITHOUT CONTRAST TECHNIQUE: Contiguous axial images were obtained from the base of the skull through the vertex without intravenous contrast. COMPARISON:  Limited vascular imaging earlier today. FINDINGS: Brain: Generalized atrophy. Chronic small-vessel ischemia changes of the white matter. No sign of acute infarction, mass lesion, hemorrhage, hydrocephalus or extra-axial collection. Vascular: Some vascular contrast is present. There is atherosclerotic calcification of the major vessels at the base of the brain. Skull: Normal Sinuses/Orbits: Seasonal mucosal inflammatory changes. Orbits negative. Other: None IMPRESSION: No acute finding by CT. No sign of acute ischemic brain insult is yet visible. No hyperdense vessels beyond density associated with vascular calcification and previously administered contrast.  Chronic small-vessel changes of the hemispheric white matter. Electronically Signed   By: Nelson Chimes M.D.   On: 02/17/2017  13:47   Portable Chest Xray  Result Date: 03/06/2017 CLINICAL DATA:  Endotracheal tube. EXAM: PORTABLE CHEST 1 VIEW COMPARISON:  Radiograph of February 26, 2017. FINDINGS: Endotracheal tube has been partially withdrawn, with distal tip 8 cm above the carina. Nasogastric tube is unchanged in position. Status post coronary artery bypass graft. No pneumothorax is noted. Mild bibasilar subsegmental atelectasis is noted. Status post bilateral shoulder arthroplasties. IMPRESSION: Endotracheal tube has been partially withdrawn, but remains in good position. Stable bibasilar subsegmental atelectasis is noted. Electronically Signed   By: Marijo Conception, M.D.   On: 02/21/2017 07:52   Dg Chest Port 1 View  Result Date: 02/20/2017 CLINICAL DATA:  Right carotid occlusion and status post intubation. EXAM: PORTABLE CHEST 1 VIEW COMPARISON:  11/08/2013 FINDINGS: Endotracheal tube present with the tip approximately 3.5 cm above the carina. Gastric decompression tube extends below the diaphragm. The heart size is normal status post prior CABG. There is mild atelectasis in the right lower lung. There is no evidence of pulmonary edema, consolidation, pneumothorax, nodule or pleural fluid. Bilateral humeral hemiarthroplasties present. IMPRESSION: Endotracheal tube tip approximately 3.5 cm above the carina. Mild atelectasis present in the right lower lung. Electronically Signed   By: Aletta Edouard M.D.   On: 02/16/2017 15:49   Dg Abd Portable 1v  Result Date: 03/11/2017 CLINICAL DATA:  Orogastric tube placement. EXAM: PORTABLE ABDOMEN - 1 VIEW COMPARISON:  No recent prior. FINDINGS: Orogastric tube noted with tip at the gastroesophageal junction. Advancement of approximately 12 cm should be considered. No bowel distention. IMPRESSION: Orogastric tube noted with tip at the gastroesophageal junction. Advancement of approximately 12 cm should be considered. Para these results will be called to the ordering clinician or  representative by the Radiologist Assistant, and communication documented in the PACS or zVision Dashboard. Electronically Signed   By: Marcello Moores  Register   On: 03/12/2017 07:52    CULTURES: Results for orders placed or performed during the hospital encounter of 02/20/17  Surgical pcr screen     Status: None   Collection Time: 02/20/17  8:56 AM  Result Value Ref Range Status   MRSA, PCR NEGATIVE NEGATIVE Final   Staphylococcus aureus NEGATIVE NEGATIVE Final    Comment: (NOTE) The Xpert SA Assay (FDA approved for NASAL specimens in patients 100 years of age and older), is one component of a comprehensive surveillance program. It is not intended to diagnose infection nor to guide or monitor treatment.     OTHER STUDIES:  -  ANTIBIOTICS: Cefuroxime 1/14 (perioperative)   SIGNIFICANT EVENTS: -  LINES/TUBES:   My assessment, plan of care, findings, medications, side effects, etc. were discussed with:  Nurse  Respiratory Therapy  Patient (questions answered)  Family (questions answered) updated   Renee Pain, MD Board Certified by the ABIM, [place] Pager: [phone]  02/15/2017, 11:12 AM

## 2017-02-27 NOTE — Progress Notes (Signed)
Notified of elevated troponin.  Spoke with Dr. Jacinto Halim regarding the results, which he was already aware of.  He suspects demand ischemia.  Will continue to monitor.   Durene Cal

## 2017-02-27 NOTE — Progress Notes (Signed)
Referring Physician(s): Dr. Lemar Livings  Supervising Physician: Julieanne Cotton  Patient Status: [facility] - In-pt  Chief Complaint: Carotid dissection  Subjective: Patient intubated, but awake and alert and wants to be extubated badly.  Follows commands.  Allergies: Ivp dye [iodinated diagnostic agents]  Medications: Prior to Admission medications   Medication Sig Start Date End Date Taking? Authorizing Provider  aspirin EC 81 MG tablet Take 81 mg by mouth daily.   Yes [provider]  atorvastatin (LIPITOR) 10 MG tablet Take 10 mg by mouth at bedtime.   Yes [provider]  carvedilol (COREG) 25 MG tablet Take 25 mg by mouth 2 (two) times daily.  10/29/13  Yes Robbie Lis M, PA-C  diltiazem (CARDIZEM CD) 120 MG 24 hr capsule Take 1 capsule (120 mg total) by mouth 2 (two) times daily before a meal. 11/10/13  Yes Barrett, Rhonda G, PA-C  glimepiride (AMARYL) 2 MG tablet Take 2 mg by mouth 2 (two) times daily before a meal.    Yes [provider]  hydrALAZINE (APRESOLINE) 50 MG tablet Take 50 mg by mouth 3 (three) times daily.   Yes [provider]  isosorbide mononitrate (IMDUR) 60 MG 24 hr tablet Take 1 tablet by mouth daily as needed (chest pain).  01/24/17  Yes [provider]  metFORMIN (GLUCOPHAGE-XR) 500 MG 24 hr tablet Take 1 tablet (500 mg total) by mouth 2 (two) times daily with a meal. 02/11/17  Yes [name], MD  nitroGLYCERIN (NITROSTAT) 0.4 MG SL tablet Place 0.4 mg under the tongue every 5 (five) minutes as needed for chest pain.   Yes [provider]  olmesartan (BENICAR) 20 MG tablet Take 1 tablet by mouth daily. 01/24/17  Yes [provider]  pantoprazole (PROTONIX) 40 MG tablet Take 40 mg by mouth daily as needed.     [provider]  warfarin (COUMADIN) 5 MG tablet Take 1 tablet (5 mg total) by mouth daily. Patient taking differently: Take 5 mg by mouth daily at 6 PM.  12/05/13   Lennette Bihari, MD    Vital Signs: BP 132/66   Pulse 82   Temp 98.5 F (36.9 C) (Axillary)   Resp 15   Ht 5\' 11"  (1.803 m)   Wt 204 lb 2.3 oz (92.6 kg)   SpO2 100%   BMI 28.47 kg/m   Physical Exam: Neck: incision is c/d/i.  JP with some bloody output Skin: R CFA site is c/d/i.  Dressing changed.  No active oozing noted. Neruo: follows commands with upper and lower extremities.  5/5 upper and lower extremity strength.  Imaging: Ct Head Wo Contrast  Result Date: Mar 28, 2017 CLINICAL DATA:  Followup vascular intervention. EXAM: CT HEAD WITHOUT CONTRAST TECHNIQUE: Contiguous axial images were obtained from the base of the skull through the vertex without intravenous contrast. COMPARISON:  Limited vascular imaging earlier today. FINDINGS: Brain: Generalized atrophy. Chronic small-vessel ischemia changes of the white matter. No sign of acute infarction, mass lesion, hemorrhage, hydrocephalus or extra-axial collection. Vascular: Some vascular contrast is present. There is atherosclerotic calcification of the major vessels at the base of the brain. Skull: Normal Sinuses/Orbits: Seasonal mucosal inflammatory changes. Orbits negative. Other: None IMPRESSION: No acute finding by CT. No sign of acute ischemic brain insult is yet visible. No hyperdense vessels beyond density associated with vascular calcification and previously administered contrast. Chronic small-vessel changes of the hemispheric white matter. Electronically Signed   By: 03/05/2017 M.D.   On:  02/21/2017 13:47   Portable Chest Xray  Result Date: 02/13/2017 CLINICAL DATA:  Endotracheal tube. EXAM: PORTABLE CHEST 1 VIEW COMPARISON:  Radiograph of 03/14/2017. FINDINGS: Endotracheal tube has been partially withdrawn, with distal tip 8 cm above the carina. Nasogastric tube is unchanged in position. Status post coronary artery bypass graft. No pneumothorax is noted. Mild bibasilar subsegmental atelectasis is noted. Status post bilateral  shoulder arthroplasties. IMPRESSION: Endotracheal tube has been partially withdrawn, but remains in good position. Stable bibasilar subsegmental atelectasis is noted. Electronically Signed   By: Lupita Raider, M.D.   On: 03/12/2017 07:52   Dg Chest Port 1 View  Result Date: February 28, 2017 CLINICAL DATA:  Right carotid occlusion and status post intubation. EXAM: PORTABLE CHEST 1 VIEW COMPARISON:  11/08/2013 FINDINGS: Endotracheal tube present with the tip approximately 3.5 cm above the carina. Gastric decompression tube extends below the diaphragm. The heart size is normal status post prior CABG. There is mild atelectasis in the right lower lung. There is no evidence of pulmonary edema, consolidation, pneumothorax, nodule or pleural fluid. Bilateral humeral hemiarthroplasties present. IMPRESSION: Endotracheal tube tip approximately 3.5 cm above the carina. Mild atelectasis present in the right lower lung. Electronically Signed   By: Irish Lack M.D.   On: February 28, 2017 15:49   Dg Abd Portable 1v  Result Date: 02/20/2017 CLINICAL DATA:  Orogastric tube placement. EXAM: PORTABLE ABDOMEN - 1 VIEW COMPARISON:  No recent prior. FINDINGS: Orogastric tube noted with tip at the gastroesophageal junction. Advancement of approximately 12 cm should be considered. No bowel distention. IMPRESSION: Orogastric tube noted with tip at the gastroesophageal junction. Advancement of approximately 12 cm should be considered. Para these results will be called to the ordering clinician or representative by the Radiologist Assistant, and communication documented in the PACS or zVision Dashboard. Electronically Signed   By: Maisie Fus  Register   On: 02/25/2017 07:52   Dg C-arm 1-60 Min  Result Date: 02/19/2017 CLINICAL DATA:  77 year old male right-sided carotid endarterectomy EXAM: DG C-ARM 61-120 MIN COMPARISON:  None. FINDINGS: Two angiographic runs are submitted for review. The images depict a high-grade flow limiting stenosis  versus dissection in the distal internal carotid artery near the skull base. IMPRESSION: Intraoperative images obtained during right carotid endarterectomy. Please see operative note for further detail. Electronically Signed   By: Malachy Moan M.D.   On: 02/22/2017 10:56    Labs:  CBC: Recent Labs    02/20/17 0858 03/08/2017 1128 Feb 28, 2017 1414 03/05/2017 1756 03/08/2017 0449  WBC 9.4  --  10.1 7.2 9.4  HGB 10.5* 8.5* 8.8* 7.8* 7.3*  HCT 32.7* 25.0* 26.4* 24.0* 21.9*  PLT 235  --  218 226 222    COAGS: Recent Labs    08/04/16 0645 08/05/16 0444 02/09/17 0800 02/20/17 0858 03/03/2017 1414 03/13/2017 1756  INR 1.20 1.24 1.35 1.13  --   --   APTT  --   --   --  28 >200* 117*    BMP: Recent Labs    07/24/16 1014 08/04/16 1420 08/05/16 0444 02/20/17 0858 02/21/2017 1128 02/16/2017 0449  NA 137  --  135 137 139 137  K 4.5  --  4.5 4.4 4.4 4.2  CL 104  --  102 104  --  110  CO2 22  --  24 23  --  17*  GLUCOSE 131*  --  138* 100* 139* 151*  BUN 29*  --  24* 28*  --  31*  CALCIUM 9.2  --  8.9 9.4  --  8.1*  CREATININE 1.29* 1.29* 1.24 1.25*  --  1.41*  GFRNONAA 52* 52* 55* 54*  --  47*  GFRAA >60 >60 >60 >60  --  54*    LIVER FUNCTION TESTS: Recent Labs    02/20/17 0858  BILITOT 1.0  AST 22  ALT 17  ALKPHOS 100  PROT 6.5  ALBUMIN 3.7    Assessment and Plan: 1. R ICA dissection, s/p pipeline stenting  brilinta start last night per Dr. Corliss Skains.  P2Y12 ordered today. He remains on heparin and ASA as well. Getting 2 units of pRBCs today per vascular Hopeful to extubated this am. Will continue to follow.  Electronically Signed: Letha Cape 02/18/2017, 11:12 AM   I spent a total of 15 Minutes at the the patient's bedside AND on the patient's hospital floor or unit, greater than 50% of which was counseling/coordinating care for R ICA carotid dissection

## 2017-02-27 NOTE — Progress Notes (Signed)
CRITICAL VALUE ALERT  Critical Value:  Troponin 0.63  Date & Time Notied: 02/28/16 1945  Provider Notified: Melvyn Neth  Orders Received/Actions taken: No further actions at this time

## 2017-02-27 NOTE — Procedures (Signed)
Extubation Procedure Note  Patient Details:   Name: [name] DOB: Nov 22, 1940 MRN: [id number]   Airway Documentation:     Evaluation  O2 sats: stable throughout Complications: No apparent complications Patient did tolerate procedure well. Bilateral Breath Sounds: Rhonchi   Yes   Positive cuff leak noted.  Pt placed on [place] 4 L with humidity, slight stridor noted - MD notified at bedside. Pt tolerating well at this time.  Pt able to reach 1750 mL using incentive spirometer.  Rayburn Felt 03/07/2017, 10:49 AM

## 2017-02-27 NOTE — Code Documentation (Signed)
.. ..    Name: [name] MRN: [id number] DOB: 23-Mar-1940    ADMISSION DATE:  02/14/2017  BRIEF PATIENT DESCRIPTION: 77 yr old male with PMHx of CAD, s/p CABG and stenting, Afib on Coumadin, presented on 02/23/2017 for management of high grade stenosis 90% lesion in the Right ICA. S/p Right carotid endarterectomy with bovine pericardial patch angioplasty, Right carotid Duplex and Right carotid angiogram.  EVENT: Called to bedside by Elink for CODE BLUE Initial rhythm V Fib Upon arrival CRNA had attempted to intubate the patient with no success. Succinylcholine had been administered prior to my arrival. ACLS protocol continued Lots of blood in oropharynx attempted to intubate patient unable to see cords only arytenoids a lot of swelling and blood. Despite multiple attempts by myself and Anesthesia using different modalities (Glideoscope, direct, Bougie) we were unable to establish an airway. Morphology was extremely distorted. Pt was pronounced at 2232/06/14 on 15-Mar-2017   I, Dr Newell Coral have personally reviewed patient's available data, including medical history, events of note, physical examination and test results as part of my evaluation. I have discussed with other care providers regarding the events of this code. Family at bedside is aware of patient's decline and expiration. CODE sheet was signed by senior resident. Appreciate assistance by Anesthesia in this very difficult airway   Signed Dr Newell Coral Pulmonary Critical Care Locums   03/15/17, 11:15 PM

## 2017-02-27 NOTE — Evaluation (Signed)
Clinical/Bedside Swallow Evaluation Patient Details  Name: [name] MRN: [id number] Date of Birth: 03-29-1940  Today's Date: Mar 19, 2017 Time: SLP Start Time (ACUTE ONLY): 1356 SLP Stop Time (ACUTE ONLY): 1415 SLP Time Calculation (min) (ACUTE ONLY): 19 min  Past Medical History:  Past Medical History:  Diagnosis Date  . Arthritis   . Atrial fibrillation (HCC)    successful cardioversion 12/2013  . Barrett's esophagus   . Chronic kidney disease    CKD  . Coronary artery disease    a. h/o CABG 1997. b. stenting to graft-RCA, CB to LAD after LIMA in 2006. c. stenting to VG-RCA beyond initial stent and in-stent restenosis in 2011. c. Complex PCI 07/2013 to SVG-RCA and distal native RCA involving 4 sites (see cath report).  . Diabetes (HCC)    Type II  . Dyspnea    with exertion  . GERD (gastroesophageal reflux disease)   . Heart murmur   . History of kidney stones   . Hyperlipidemia   . Hypertension   . Myocardial infarction (HCC)   . Pre-syncope    a. 09/2012: in the setting of mild dehydration with significant straining possibly inducing a vagal event while he was dragging heavy logs.   . Sleep apnea    cpap   Past Surgical History:  Past Surgical History:  Procedure Laterality Date  . CARDIOVERSION N/A 12/29/2013   Procedure: CARDIOVERSION;  Surgeon: Laurey Morale, MD;  Location: [facility] ENDOSCOPY;  Service: Cardiovascular;  Laterality: N/A;  . CAROTID ANGIOGRAPHY Bilateral 02/09/2017   Procedure: CAROTID ANGIOGRAPHY;  Surgeon: [name], MD;  Location: [facility] INVASIVE CV LAB;  Service: Cardiovascular;  Laterality: Bilateral;  . COLONOSCOPY W/ POLYPECTOMY    . CORONARY ANGIOPLASTY WITH STENT PLACEMENT    . CORONARY ARTERY BYPASS GRAFT     5 grafts  . CORONARY STENT PLACEMENT  07/30/2013   DES to RCA     DR KELLY  . ENDARTERECTOMY Right 02/23/2017   Procedure: ENDARTERECTOMY CAROTID RIGHT;  Surgeon: Maeola Harman, MD;  Location: [facility] OR;  Service: Vascular;   Laterality: Right;  . HERNIA REPAIR Left    Inguinal  . JOINT REPLACEMENT Bilateral    Shoulders  . KIDNEY STONE SURGERY    . LEFT HEART CATH AND CORS/GRAFTS ANGIOGRAPHY N/A 02/09/2017   Procedure: LEFT HEART CATH AND CORS/GRAFTS ANGIOGRAPHY;  Surgeon: [name], MD;  Location: [facility] INVASIVE CV LAB;  Service: Cardiovascular;  Laterality: N/A;  . LEFT HEART CATHETERIZATION WITH CORONARY/GRAFT ANGIOGRAM N/A 07/30/2013   Procedure: LEFT HEART CATHETERIZATION WITH Isabel Caprice;  Surgeon: Lennette Bihari, MD;  Location: [facility] CATH LAB;  Service: Cardiovascular;  Laterality: N/A;  . LITHOTRIPSY    . NEPHROLITHOTOMY     kidney stone  . PATCH ANGIOPLASTY Right 03/05/2017   Procedure: PATCH ANGIOPLASTY RIGHT CAROTIED ARTERY USING XENOSURE BIOLOGIC PATCH;  Surgeon: Maeola Harman, MD;  Location: [facility] OR;  Service: Vascular;  Laterality: Right;  . PERCUTANEOUS CORONARY INTERVENTION-BALLOON ONLY  07/30/2013   Procedure: PERCUTANEOUS CORONARY INTERVENTION-BALLOON ONLY;  Surgeon: Lennette Bihari, MD;  Location: [facility] CATH LAB;  Service: Cardiovascular;;  . PERCUTANEOUS CORONARY STENT INTERVENTION (PCI-S)  07/30/2013   Procedure: PERCUTANEOUS CORONARY STENT INTERVENTION (PCI-S);  Surgeon: Lennette Bihari, MD;  Location: [facility] CATH LAB;  Service: Cardiovascular;;  . SHOULDER SURGERY Bilateral    last 11/17 left  . TOTAL HIP ARTHROPLASTY Left 08/04/2016   Procedure: LEFT TOTAL HIP ARTHROPLASTY ANTERIOR APPROACH;  Surgeon: Samson Frederic, MD;  Location: [facility] OR;  Service: Orthopedics;  Laterality: Left;  . TOTAL SHOULDER REVISION Left 12/29/2015   Procedure: TOTAL SHOULDER REVISION ARHTROPLASTY;  Surgeon: Beverely Low, MD;  Location: [facility] OR;  Service: Orthopedics;  Laterality: Left;  . UPPER GI ENDOSCOPY     HPI:  77 y.o. male with hx CABG 1997, Barrett's esophagus, sleep apnea in ICU s/p stenting and pipeline stenting of right ICA secondary to dissection after vascular procedure. JP drain in right neck  has considerable drainage.  Intubated for procedure, extubated 1/15.    Assessment / Plan / Recommendation Clinical Impression  Pt presents with significant dysphagia secondary to edema.  Voice and cough are strong s/p brief intubation, but suspect right neck hematoma, edema are impeding necessary airway protection at the supraglottic level during the swallow.  Pt is having difficulty managing secretions. Ice chips/thin liquids elicit immediate cough, wet phonation, with difficulty clearing despite self-suctioning. No further POs offered given severity of deficits. Recommend continued NPO for now; SLP will f/u next day for ongoing assessment. D/W RN, pt, family.   SLP Visit Diagnosis: Dysphagia, unspecified (R13.10)    Aspiration Risk  Severe aspiration risk    Diet Recommendation   NPO        Other  Recommendations Oral Care Recommendations: Oral care QID   Follow up Recommendations Other (comment)(tba)      Frequency and Duration min 3x week  1 week       Prognosis Prognosis for Safe Diet Advancement: Good      Swallow Study   General Date of Onset: 02/22/2017 HPI: 77 y.o. male with hx CABG 1997, Barrett's esophagus, sleep apnea in ICU s/p stenting and pipeline stenting of right ICA secondary to dissection after vascular procedure. JP drain in right neck has considerable drainage.  Intubated for procedure, extubated 1/15.  Type of Study: Bedside Swallow Evaluation Previous Swallow Assessment: no Diet Prior to this Study: NPO Temperature Spikes Noted: No Respiratory Status: Room air History of Recent Intubation: Yes Length of Intubations (days): 1 days Date extubated: 02/16/2017 Behavior/Cognition: Alert Oral Cavity Assessment: Edema(right cheek/neck) Oral Care Completed by SLP: Recent completion by staff Oral Cavity - Dentition: Adequate natural dentition Vision: Functional for self-feeding Self-Feeding Abilities: Needs assist Patient Positioning: Upright in bed Baseline  Vocal Quality: Wet Volitional Cough: Strong Volitional Swallow: Able to elicit    Oral/Motor/Sensory Function Overall Oral Motor/Sensory Function: Within functional limits   Ice Chips Ice chips: Impaired Presentation: Spoon Pharyngeal Phase Impairments: Throat Clearing - Immediate   Thin Liquid Thin Liquid: Impaired Presentation: Straw Pharyngeal  Phase Impairments: Cough - Immediate;Multiple swallows;Wet Vocal Quality    Nectar Thick Nectar Thick Liquid: Not tested   Honey Thick Honey Thick Liquid: Not tested   Puree Puree: Not tested   Solid   GO   Solid: Not tested        Blenda Mounts Laurice 02/13/2017,2:18 PM  Marchelle Folks L. Samson Frederic, [place] CCC/SLP Pager [phone]

## 2017-02-28 ENCOUNTER — Encounter (HOSPITAL_COMMUNITY): Payer: Self-pay | Admitting: Interventional Radiology

## 2017-02-28 LAB — TYPE AND SCREEN
ABO/RH(D): O POS
ANTIBODY SCREEN: NEGATIVE
UNIT DIVISION: 0
Unit division: 0

## 2017-02-28 LAB — BPAM RBC
BLOOD PRODUCT EXPIRATION DATE: 201902092359
Blood Product Expiration Date: 201902092359
ISSUE DATE / TIME: 201901150825
ISSUE DATE / TIME: 201901151113
Unit Type and Rh: 5100
Unit Type and Rh: 5100

## 2017-02-28 MED FILL — Medication: Qty: 1 | Status: AC

## 2017-03-16 ENCOUNTER — Encounter (HOSPITAL_COMMUNITY): Payer: PPO

## 2017-03-16 ENCOUNTER — Encounter: Payer: PPO | Admitting: Vascular Surgery

## 2017-03-16 NOTE — Progress Notes (Signed)
At 2207: Pt developed V-fib with a rate of 150. Pt was unresponsive upon arrival, no pulse. Called a code blue, ACLS protocol, 1 shock given. PEA on subsequent pulse checks, see code sheet for medications given. Multiple attempts to intubate by Anesthesia and CCM MD.Unable to obtain airway, pt was bagged throughout code. At 2234, wife at beside during code. At 2234, wife made decision to stop compressions. TOD was called at 2234 by Renato Gails, RN and [place] Allen, RN. Called ME and Volney Presser informed that pt is a ME case. CDS called potential donor for tissue. CDS referral number [id number].

## 2017-03-16 NOTE — Discharge Summary (Signed)
Physician Discharge Summary  Patient ID: [name] MRN: [id number] DOB/AGE: 1940/08/09 77 y.o.  Admit date: 02/23/2017 Discharge date: 2017-03-02  Admission Diagnosis: Asymptomatic carotid artery stenosis  Discharge Diagnoses:  Same vfib arrest  Secondary Diagnoses: Active Problems:   Internal carotid artery dissection (HCC)   Hypotension   Endotracheally intubated   Acute blood loss anemia   Procedures: 1.  Right carotid endarterectomy with bovine pericardial patch angioplasty 2.  Right carotid Duplex 3.  Right carotid angiogram   4. bilateral  common carotid arteriograms followed by endovascular repair of occluded RT ICA with dissection and pseudoaneurysm, using x 2 25mm x 30 mm pipeline flow diverters with complete revascularization    Discharged Condition: death  Hospital Course: admitted following above procedures to the neuro icu where was intubated and sedated over the night of surgery. The following morning he was extubated and tolerated well despite right neck hematoma. He complained of chest pain but was thought secondary to demand ischemia. Developed sudden ventricular fibrillation and despite code team efforts could not be revived and was pronounced deceased at Jun 02, 2232 on 03-02-17.  Consults:  neurointerventional radiology Neuro critical care cardiology  Significant Diagnostic Studies: CBC CBC Latest Ref Rng & Units 03/02/17 03/15/2017 03/07/2017  WBC 4.0 - 10.5 K/uL 9.4 7.2 10.1  Hemoglobin 13.0 - 17.0 g/dL 7.3(L) 7.8(L) 8.8(L)  Hematocrit 39.0 - 52.0 % 21.9(L) 24.0(L) 26.4(L)  Platelets 150 - 400 K/uL 222 226 218     BMET @LASTCHEMISTRY @  COAG Lab Results  Component Value Date   INR 1.13 02/20/2017   INR 1.35 02/09/2017   INR 1.24 08/05/2016   No results found for: PTT  Disposition: 20-Expired   Allergies as of 03-Mar-2017      Reactions   Ivp Dye [iodinated Diagnostic Agents] Other (See Comments)   Increased blood pressure, heart  rate "pt denies allergy " has had contrast since and no reaction. Betadine paint/scrub "OK" per patient      Medication List    ASK your doctor about these medications   aspirin EC 81 MG tablet Take 81 mg by mouth daily.   atorvastatin 10 MG tablet Commonly known as:  LIPITOR Take 10 mg by mouth at bedtime.   carvedilol 25 MG tablet Commonly known as:  COREG Take 25 mg by mouth 2 (two) times daily.   diltiazem 120 MG 24 hr capsule Commonly known as:  CARDIZEM CD Take 1 capsule (120 mg total) by mouth 2 (two) times daily before a meal.   glimepiride 2 MG tablet Commonly known as:  AMARYL Take 2 mg by mouth 2 (two) times daily before a meal.   hydrALAZINE 50 MG tablet Commonly known as:  APRESOLINE Take 50 mg by mouth 3 (three) times daily.   isosorbide mononitrate 60 MG 24 hr tablet Commonly known as:  IMDUR Take 1 tablet by mouth daily as needed (chest pain).   metFORMIN 500 MG 24 hr tablet Commonly known as:  GLUCOPHAGE-XR Take 1 tablet (500 mg total) by mouth 2 (two) times daily with a meal.   nitroGLYCERIN 0.4 MG SL tablet Commonly known as:  NITROSTAT Place 0.4 mg under the tongue every 5 (five) minutes as needed for chest pain.   olmesartan 20 MG tablet Commonly known as:  BENICAR Take 1 tablet by mouth daily.   pantoprazole 40 MG tablet Commonly known as:  PROTONIX Take 40 mg by mouth daily as needed.   warfarin 5 MG tablet Commonly known as:  COUMADIN Take  1 tablet (5 mg total) by mouth daily.        Signed:  Luanna Salk. Randie Heinz, MD [facility] Office: [phone] Pager: [phone]   03/05/2017, 4:17 PM

## 2017-03-16 NOTE — Progress Notes (Signed)
Chaplain was present when [name] Coded and utlimately expired. Chaplain offered comfort, ministry of presence and empathic listening to Pt.'s spouse who was present in the room when the clinical team was engaging the Code protocol.  Chaplain sat next to patient's wife, appropriately held her, listened and ensured she was understanding the information that the Critical Care physician was communicating. Chaplain was present when pt.'s wife gave word that the resuscitation measures were stopped and [name] was pronounced. Chaplain assisted spouse with calling dear friends, pastor and their son.

## 2017-03-16 NOTE — Anesthesia Postprocedure Evaluation (Signed)
Anesthesia Post Note  Patient: [name]  Procedure(s) Performed: ENDARTERECTOMY CAROTID RIGHT (Right Neck) PATCH ANGIOPLASTY RIGHT CAROTIED ARTERY USING XENOSURE BIOLOGIC PATCH (Right Neck)     Patient location during evaluation: NICU Anesthesia Type: General Level of consciousness: patient remains intubated per anesthesia plan, obtunded/minimal responses and sedated Pain management: pain level controlled Vital Signs Assessment: post-procedure vital signs reviewed and stable Respiratory status: patient on ventilator - see flowsheet for VS and patient remains intubated per anesthesia plan Cardiovascular status: stable : There was surgical complication c dissected carotid , then to IR for intervention , then associated postop precdure bleeding , Patinet kept intubated overnight. Anesthetic complications: no    Last Vitals:  Vitals:   07-Mar-2017 2100 2017/03/07 2200  BP: 135/78 (!) 143/67  Pulse: 92 91  Resp: 15 15  Temp:    SpO2: 98% 97%    Last Pain:  Vitals:   03-07-2017 2000  TempSrc:   PainSc: 0-No pain                 Aariona Momon,JAMES TERRILL

## 2017-03-16 NOTE — Progress Notes (Signed)
1030 Patient complaining of chest pain, MD at bedside. Patient administered Nitro 0.4mg  SL tablet x 1. After administration patient stated that the chest pain had improved.    1050 Patient complaining of chest pain again, MD notified and returned to bedside. Order received for Nitro 0.4mg  SL tablet x 1 and EKG. Patient again noted to have improvement following administration of Nitro.

## 2017-03-16 DEATH — deceased

## 2018-11-15 IMAGING — CT CT HEAD W/O CM
3 series · 15 of 47 positions shown, 18 images · non-contrast
Comparison: Limited vascular imaging earlier today.

CLINICAL DATA: Followup vascular intervention.

EXAM:
CT HEAD WITHOUT CONTRAST
TECHNIQUE: Contiguous axial images were obtained from the base of the skull
through the vertex without intravenous contrast.

[Series 3: head 5.0 h30s · axial · 0.48mm/px · z∈[-159,-29]mm · 9 of 32 slices shown, 12 images]
[im 3/32  brain]
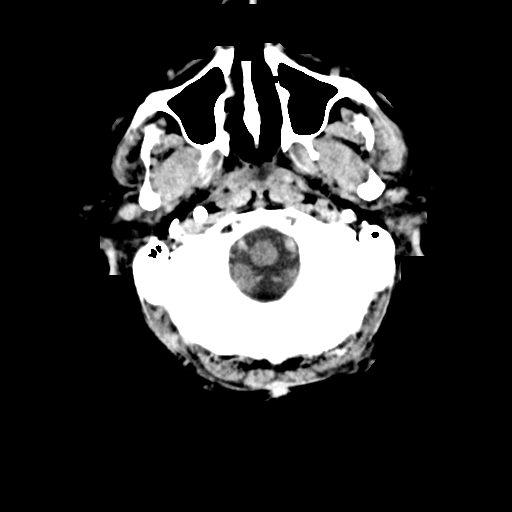
[im 3/32  bone]
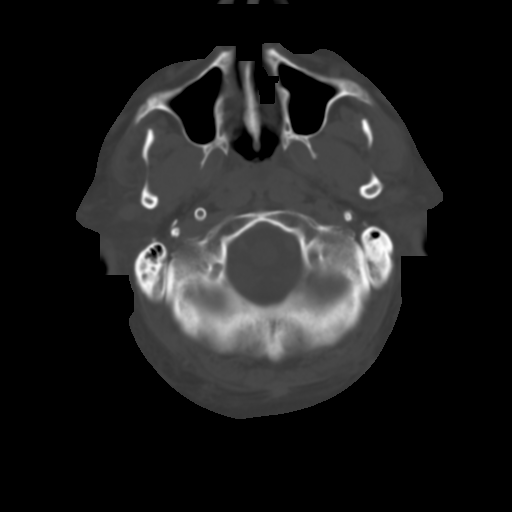
[im 6/32  brain]
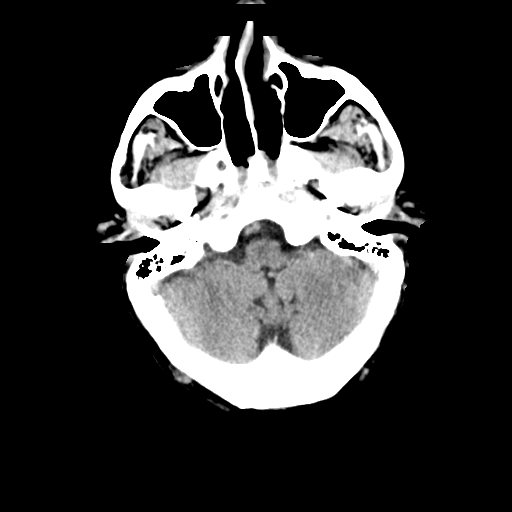
[im 9/32  brain]
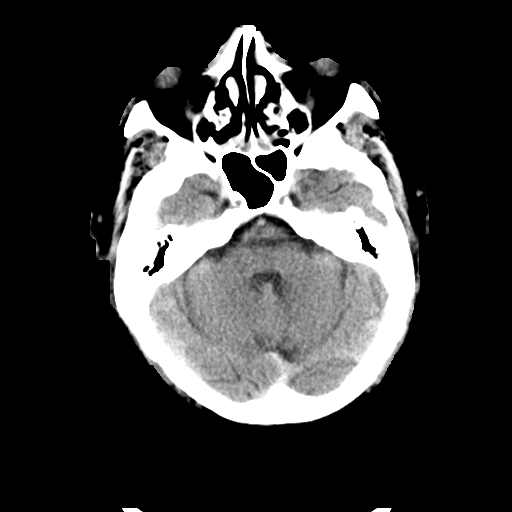
[im 12/32  brain]
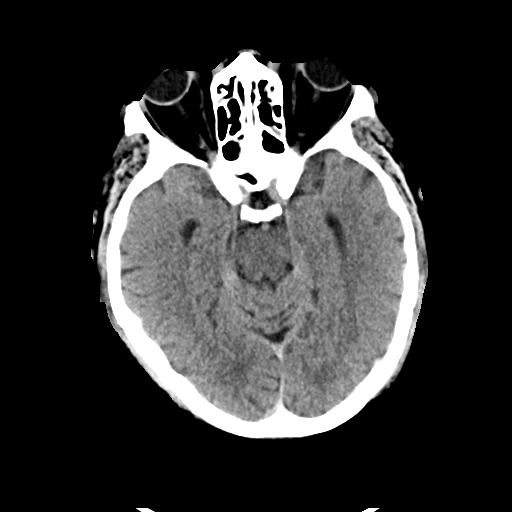
[im 17/32  brain]
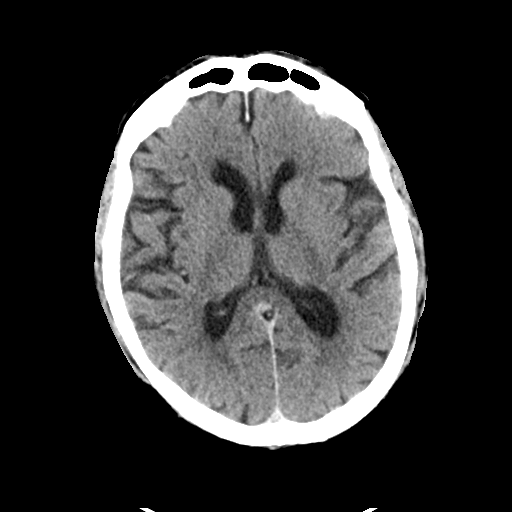
[im 17/32  bone]
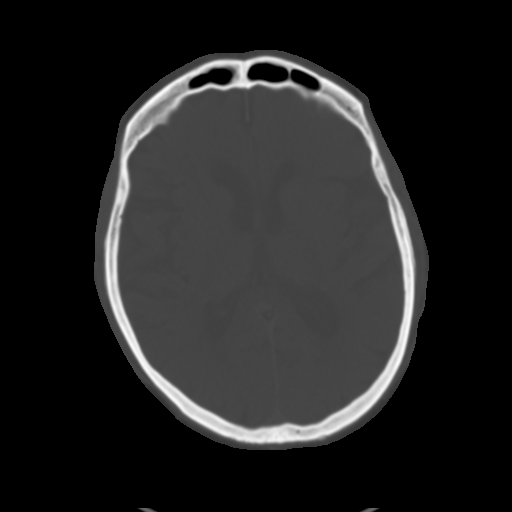
[im 20/32  brain]
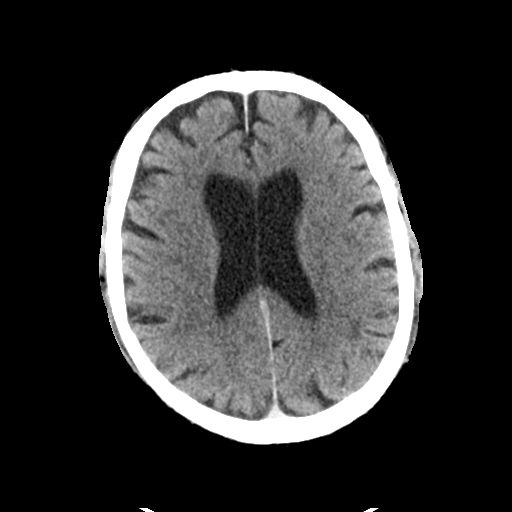
[im 23/32  brain]
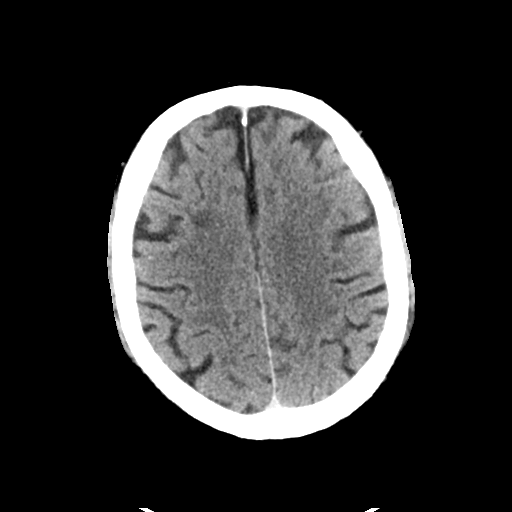
[im 26/32  brain]
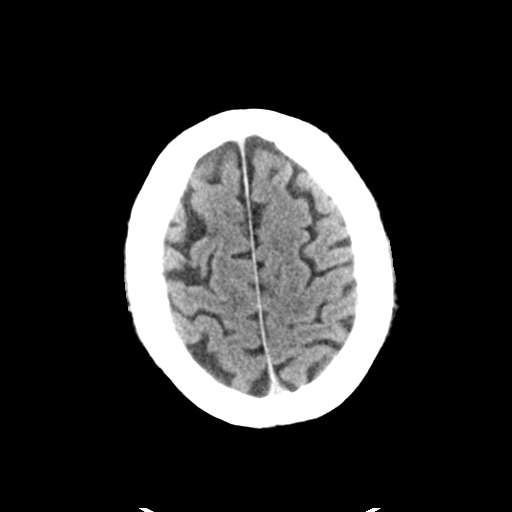
[im 29/32  brain]
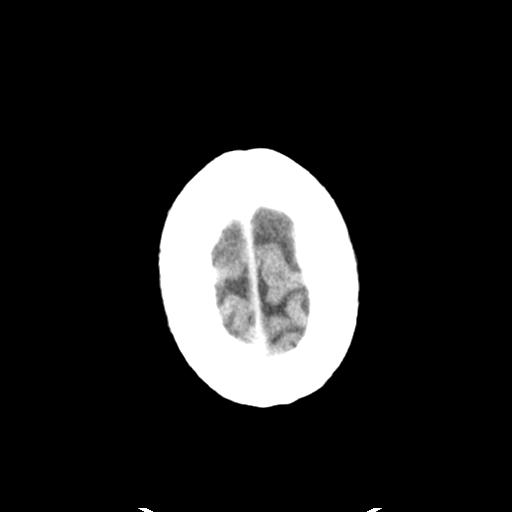
[im 29/32  bone]
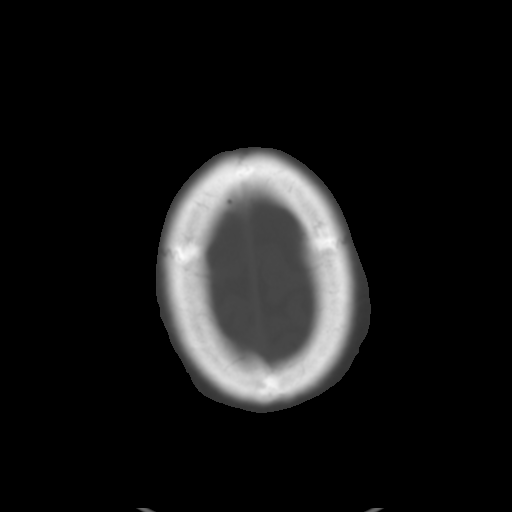

[Series 5: head 3.0 mpr cor · coronal · 0.32mm/px · 3 of 67 slices shown]
[im 23/67  brain]
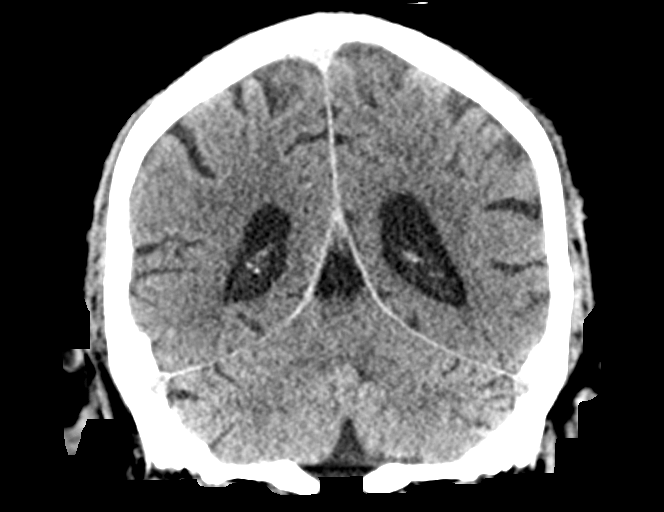
[im 30/67  brain]
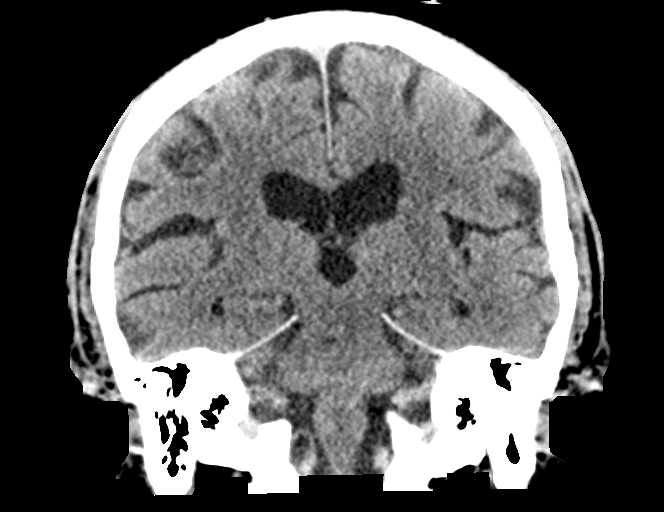
[im 37/67  brain]
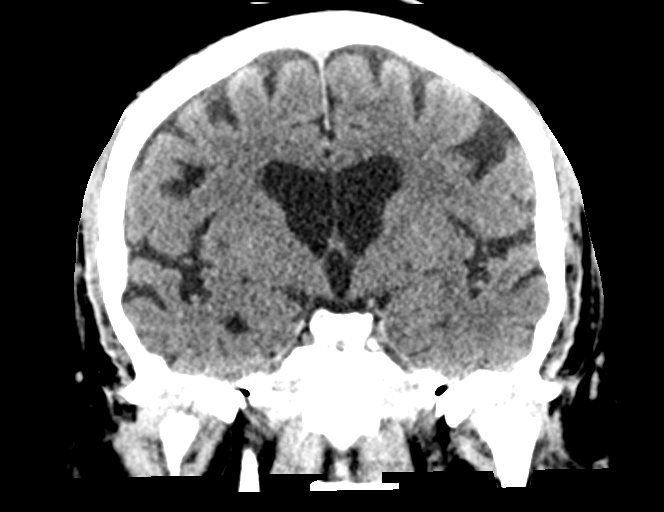

[Series 6: head 3.0 mpr sag · sagittal · 0.36mm/px · 3 of 57 slices shown]
[im 19/57  brain]
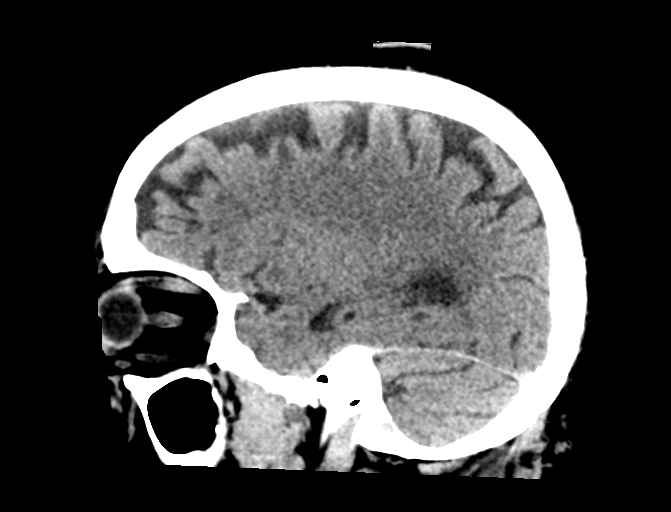
[im 29/57  brain]
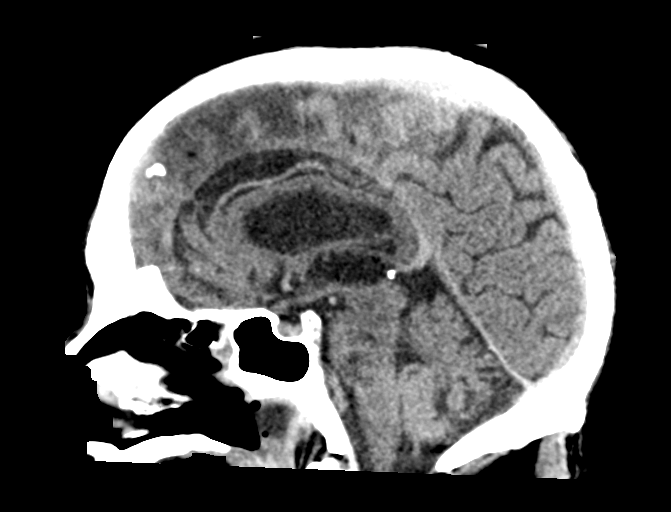
[im 38/57  brain]
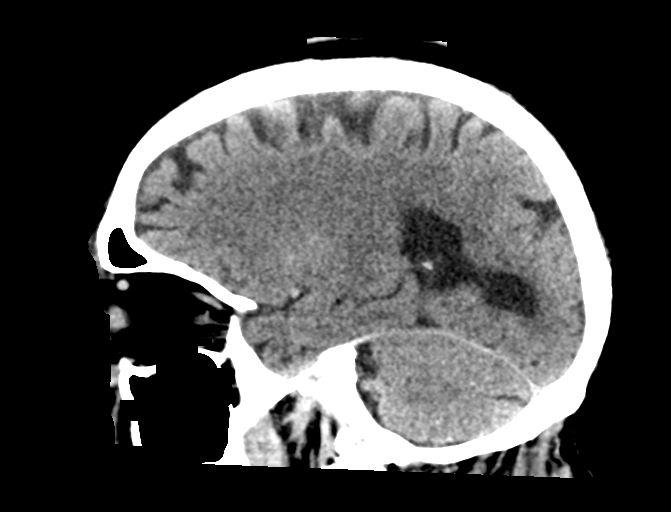

[15 of 47 positions shown; findings below may reference images not displayed]

FINDINGS: Brain: Generalized atrophy. Chronic small-vessel ischemia changes of
the white matter. No sign of acute infarction, mass lesion,
hemorrhage, hydrocephalus or extra-axial collection.

Vascular: Some vascular contrast is present. There is
atherosclerotic calcification of the major vessels at the base of
the brain.

Skull: Normal

Sinuses/Orbits: Seasonal mucosal inflammatory changes. Orbits
negative.

Other: None
IMPRESSION: No acute finding by CT. No sign of acute ischemic brain insult is
yet visible. No hyperdense vessels beyond density associated with
vascular calcification and previously administered contrast. Chronic
small-vessel changes of the hemispheric white matter.

## 2018-11-16 IMAGING — DX DG ABD PORTABLE 1V
1 series · 1 of 1 positions shown · non-contrast
Comparison: No recent prior.

CLINICAL DATA: Orogastric tube placement.

EXAM:
PORTABLE ABDOMEN - 1 VIEW

[abdomen kub]
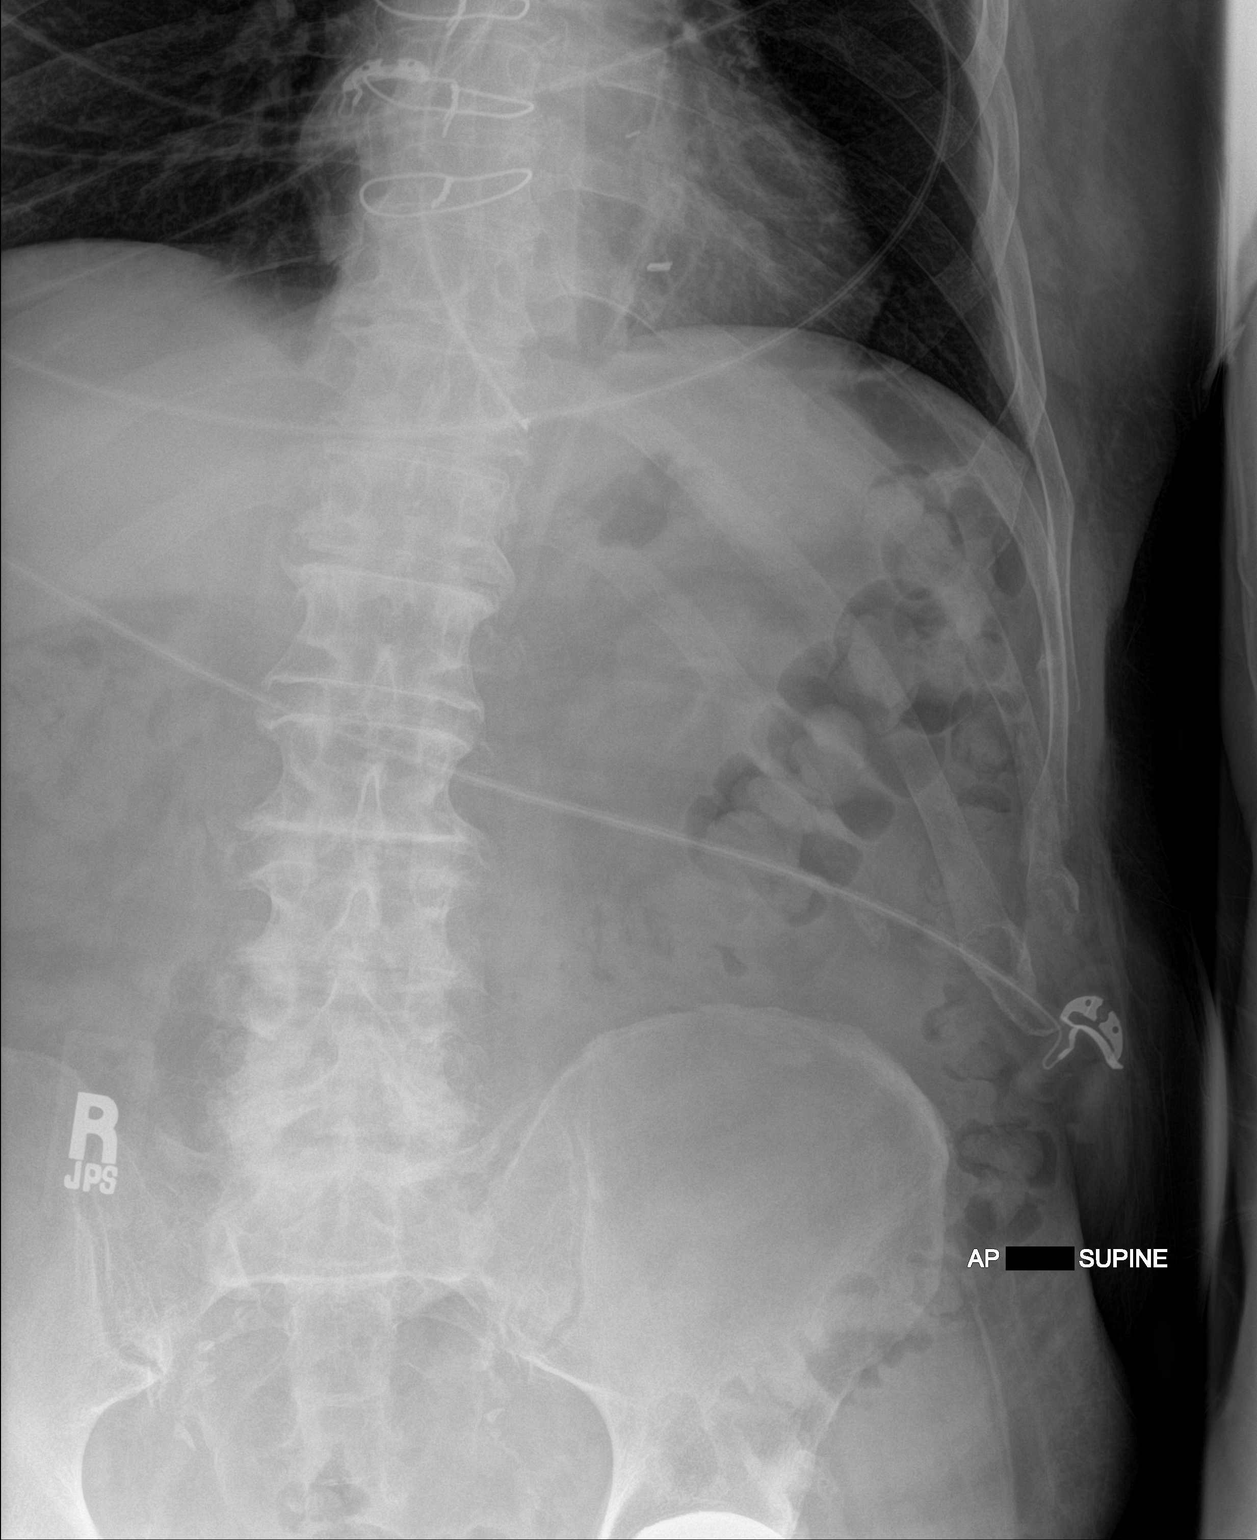

[1 of 1 positions shown; findings below may reference images not displayed]

FINDINGS: Orogastric tube noted with tip at the gastroesophageal junction.
Advancement of approximately 12 cm should be considered. No bowel
distention.
IMPRESSION: Orogastric tube noted with tip at the gastroesophageal junction.
Advancement of approximately 12 cm should be considered. Para these
results will be called to the ordering clinician or representative
by the Radiologist Assistant, and communication documented in the
PACS or [employer] Dashboard.
